# Patient Record
Sex: Male | Born: 1955 | Race: White | Hispanic: No | Marital: Single | Smoking: Never smoker
Health system: Southern US, Community
[De-identification: ages and names within clinical notes are randomized; demographics above are authoritative.]

## PROBLEM LIST (undated history)

## (undated) DIAGNOSIS — I2510 Atherosclerotic heart disease of native coronary artery without angina pectoris: Secondary | ICD-10-CM

## (undated) DIAGNOSIS — J449 Chronic obstructive pulmonary disease, unspecified: Secondary | ICD-10-CM

## (undated) DIAGNOSIS — I10 Essential (primary) hypertension: Secondary | ICD-10-CM

## (undated) DIAGNOSIS — M1990 Unspecified osteoarthritis, unspecified site: Secondary | ICD-10-CM

## (undated) DIAGNOSIS — J984 Other disorders of lung: Secondary | ICD-10-CM

## (undated) DIAGNOSIS — T4145XA Adverse effect of unspecified anesthetic, initial encounter: Secondary | ICD-10-CM

## (undated) DIAGNOSIS — R252 Cramp and spasm: Secondary | ICD-10-CM

## (undated) DIAGNOSIS — T8859XA Other complications of anesthesia, initial encounter: Secondary | ICD-10-CM

## (undated) DIAGNOSIS — C801 Malignant (primary) neoplasm, unspecified: Secondary | ICD-10-CM

## (undated) DIAGNOSIS — J45909 Unspecified asthma, uncomplicated: Secondary | ICD-10-CM

## (undated) DIAGNOSIS — F4310 Post-traumatic stress disorder, unspecified: Secondary | ICD-10-CM

## (undated) DIAGNOSIS — K219 Gastro-esophageal reflux disease without esophagitis: Secondary | ICD-10-CM

## (undated) DIAGNOSIS — G4730 Sleep apnea, unspecified: Secondary | ICD-10-CM

## (undated) HISTORY — DX: Other disorders of lung: J98.4

## (undated) HISTORY — PX: KNEE ARTHROSCOPY W/ MENISCAL REPAIR: SHX1877

## (undated) HISTORY — PX: KNEE SURGERY: SHX244

---

## 2008-03-26 ENCOUNTER — Ambulatory Visit (HOSPITAL_COMMUNITY): Admission: RE | Admit: 2008-03-26 | Discharge: 2008-03-26 | Payer: Self-pay | Admitting: Orthopedic Surgery

## 2008-04-29 ENCOUNTER — Ambulatory Visit (HOSPITAL_BASED_OUTPATIENT_CLINIC_OR_DEPARTMENT_OTHER): Admission: RE | Admit: 2008-04-29 | Discharge: 2008-04-29 | Payer: Self-pay | Admitting: Orthopedic Surgery

## 2010-06-21 LAB — POCT HEMOGLOBIN-HEMACUE: Hemoglobin: 15.1 g/dL (ref 13.0–17.0)

## 2010-07-19 NOTE — Op Note (Signed)
Elijah Knapp, Elijah Knapp             ACCOUNT NO.:  [id number]   MEDICAL RECORD NO.:  [id number]          PATIENT TYPE:  AMB   LOCATION:  [facility]                         FACILITY:  [facility]   PHYSICIAN:  Ollen Gross, M.D.    DATE OF BIRTH:  July 01, 1955   DATE OF PROCEDURE:  DATE OF DISCHARGE:                               OPERATIVE REPORT   PREOPERATIVE DIAGNOSIS:  Right knee medial meniscal tear.   POSTOPERATIVE DIAGNOSIS:  Right knee medial meniscal tear.   PROCEDURE:  Right knee arthroscopy with medial meniscal debridement.   SURGEON:  Ollen Gross, M.D.   ASSISTANT:  No assistant.   ANESTHESIA:  General.   ESTIMATED BLOOD LOSS:  Minimal.   DRAINS:  None.   COMPLICATIONS:  None.   CONDITION:  Stable to recovery.   CLINICAL NOTE:  Elijah Knapp is a 55 year old male with a several-month  history of significant right knee pain with mechanical symptoms.  Exam  and history suggested a medial meniscal tear, confirmed by MRI.  He  presents now for arthroscopy and debridement.   PROCEDURE IN DETAIL:  After the successful administration of general  anesthetic, a tourniquet was placed high on the right thigh.  The lower  extremity was prepped and draped in the usual sterile fashion.  Standard  superomedial and inferolateral incisions were made, an inflow cannula  passed superomedial, the camera passed inferolateral.  Arthroscopic  visualization proceeds.  The undersurface of the patella and the  trochlea looked normal.  The medial and lateral gutters were visualized.  There were no loose bodies.  Flexion and valgus force was applied to the  knee and the medial compartment was entered.  He had a significant tear  in the body and posterior horn of the medial meniscus.  A spinal needle  was used to localize the inferomedial portal, a small incision made, and  the dilator placed.  The meniscus was then debrided back to a stable  base with baskets and a 4.2-mm shaver and then sealed off  with the  ArthroCare device.  This was then probed and found to be stable.  The  rest of the medial compartment looked fine.  Had minimal chondromalacia.  The intercondylar notch was visualized and that looked normal.  The  lateral compartment was entered and, again, minimal chondromalacia  there.  I then inspected the rest of the joint.  There were no other  tears, loose bodies or chondral defects noted.  The arthroscopic  equipment was then removed  from the inferior portals, which were closed with interrupted 4-0 nylon.  Then 20 mL of 0.25% Marcaine with epi was injected through the inflow  cannula, then that was removed and that portal closed with nylon.  A  bulky sterile dressing was then applied and he was subsequently awakened  and transported to recovery in stable condition.      Ollen Gross, M.D.  Electronically Signed     FA/MEDQ  D:  04/29/2008  T:  04/30/2008  Job:  [id number]

## 2017-04-24 ENCOUNTER — Emergency Department (HOSPITAL_COMMUNITY)
Admission: EM | Admit: 2017-04-24 | Discharge: 2017-04-24 | Disposition: A | Discharge status: Home / Self Care | Payer: 59 | Attending: Emergency Medicine | Admitting: Emergency Medicine

## 2017-04-24 ENCOUNTER — Emergency Department (HOSPITAL_COMMUNITY): Payer: 59

## 2017-04-24 ENCOUNTER — Other Ambulatory Visit: Payer: Self-pay

## 2017-04-24 ENCOUNTER — Encounter (HOSPITAL_COMMUNITY): Payer: Self-pay | Admitting: Emergency Medicine

## 2017-04-24 DIAGNOSIS — I10 Essential (primary) hypertension: Secondary | ICD-10-CM | POA: Diagnosis not present

## 2017-04-24 DIAGNOSIS — R079 Chest pain, unspecified: Secondary | ICD-10-CM | POA: Diagnosis present

## 2017-04-24 HISTORY — DX: Essential (primary) hypertension: I10

## 2017-04-24 LAB — CBC
HEMATOCRIT: 42.3 % (ref 39.0–52.0)
HEMOGLOBIN: 14.7 g/dL (ref 13.0–17.0)
MCH: 32 pg (ref 26.0–34.0)
MCHC: 34.8 g/dL (ref 30.0–36.0)
MCV: 92 fL (ref 78.0–100.0)
Platelets: 277 10*3/uL (ref 150–400)
RBC: 4.6 MIL/uL (ref 4.22–5.81)
RDW: 12.5 % (ref 11.5–15.5)
WBC: 5 10*3/uL (ref 4.0–10.5)

## 2017-04-24 LAB — BASIC METABOLIC PANEL
ANION GAP: 14 (ref 5–15)
BUN: 10 mg/dL (ref 6–20)
CHLORIDE: 93 mmol/L — AB (ref 101–111)
CO2: 27 mmol/L (ref 22–32)
Calcium: 9.7 mg/dL (ref 8.9–10.3)
Creatinine, Ser: 0.93 mg/dL (ref 0.61–1.24)
GFR calc Af Amer: >60 mL/min (ref >60)
Glucose, Bld: 99 mg/dL (ref 65–99)
POTASSIUM: 3.7 mmol/L (ref 3.5–5.1)
SODIUM: 134 mmol/L — AB (ref 135–145)

## 2017-04-24 LAB — I-STAT TROPONIN, ED: Troponin i, poc: 0.01 ng/mL (ref 0.00–0.08)

## 2017-04-24 NOTE — ED Triage Notes (Signed)
Pt arrives to ED for CP that has been intermittent for over 1 year. Pt states this pain getting worse with exertion. Pt also becomes sob and has pressure in his chest with walking. Pt states he has been experiencing this for over one year and has not seen a doctor regarding this issue.

## 2017-04-24 NOTE — Discharge Instructions (Signed)
You were evaluated in the emergency department for chest pain that is going on and off for over a year.  You experience this pain again and the [facility] told [facility] to come to [facility] for an evaluation.  Your chest x-ray EKG and blood work do not show any acute injury.  He will need to follow-[facility] with the [facility] and if they are unable to see you also give you the number for cardiology group.  You will likely need some sort of testing for this chest pain.  You should take an aspirin a day.  Please return to the emergency department if you have a recurrence of your chest pain that does not resolve quickly.

## 2017-04-24 NOTE — ED Notes (Signed)
Pt stable, ambulatory, and verbalizes understanding of d/c instructions.  

## 2017-04-26 NOTE — ED Provider Notes (Addendum)
[place] [place] EMERGENCY DEPARTMENT Provider Note   CSN: [id number] Arrival date & time: 04/24/17  1111     History   Chief Complaint Chief Complaint  Patient presents with  . Chest Pain    HPI Elijah Knapp is a 62 y.o. male.He has had intermittent CP for 1 year plus. Happened today again. With exertion. Resolved. [facility] and was directed to come to ED for eval.   The history is provided by the patient.  Chest Pain   This is a chronic problem. The current episode started 3 to 5 hours ago. The problem occurs every several days. The problem has been resolved. The pain is associated with exertion. The pain is present in the substernal region. The pain is at a severity of 5/10. The quality of the pain is described as pressure-like. The pain does not radiate. Pertinent negatives include no abdominal pain, no back pain, no cough, no diaphoresis, no dizziness, no fever, no hemoptysis, no nausea, no palpitations, no shortness of breath and no vomiting.  Pertinent negatives for past medical history include no seizures.    Past Medical History:  Diagnosis Date  . Hypertension     There are no active problems to display for this patient.   History reviewed. No pertinent surgical history.     Home Medications    Prior to Admission medications   Not on File    Family History No family history on file.  Social History Social History   Tobacco Use  . Smoking status: Not on file  Substance Use Topics  . Alcohol use: Not on file  . Drug use: Not on file     Allergies   Patient has no known allergies.   Review of Systems Review of Systems  Constitutional: Negative for chills, diaphoresis and fever.  HENT: Negative for ear pain and sore throat.   Eyes: Negative for pain and visual disturbance.  Respiratory: Negative for cough, hemoptysis and shortness of breath.   Cardiovascular: Positive for chest pain. Negative for  palpitations.  Gastrointestinal: Negative for abdominal pain, nausea and vomiting.  Genitourinary: Negative for dysuria and hematuria.  Musculoskeletal: Negative for arthralgias and back pain.  Skin: Negative for color change and rash.  Neurological: Negative for dizziness, seizures and syncope.  All other systems reviewed and are negative.    Physical Exam Updated Vital Signs BP (!) 141/70   Pulse 68   Temp 98 F (36.7 C) (Oral)   Resp 18   SpO2 100%   Physical Exam  Constitutional: He appears well-developed and well-nourished.  HENT:  Head: Normocephalic and atraumatic.  Eyes: Conjunctivae are normal.  Neck: Neck supple.  Cardiovascular: Normal rate, regular rhythm and normal pulses.  No murmur heard. Pulmonary/Chest: Effort normal and breath sounds normal. No respiratory distress.  Abdominal: Soft. There is no tenderness.  Musculoskeletal: Normal range of motion. He exhibits no edema.       Right lower leg: He exhibits no tenderness and no edema.       Left lower leg: He exhibits no tenderness and no edema.  Neurological: He is alert.  Skin: Skin is warm and dry. Capillary refill takes less than 2 seconds.  Psychiatric: He has a normal mood and affect.  Nursing note and vitals reviewed.    ED Treatments / Results  Labs (all labs ordered are listed, but only abnormal results are displayed) Labs Reviewed  BASIC METABOLIC PANEL - Abnormal; Notable for the  following components:      Result Value   Sodium 134 (*)    Chloride 93 (*)    All other components within normal limits  CBC  I-STAT TROPONIN, ED    EKG  EKG Interpretation  Date/Time:  Tuesday April 24 2017 16:45:58 EST Ventricular Rate:  71 PR Interval:  162 QRS Duration: 106 QT Interval:  390 QTC Calculation: 423 R Axis:   56 Text Interpretation:  Normal sinus rhythm Nonspecific T wave abnormality Abnormal ECG Confirmed by Aletta Edouard [id number]) on 04/24/2017 4:58:36 PM       Radiology Dg  Chest 2 View  Result Date: 04/24/2017 CLINICAL DATA:  Dizziness, shortness of breath, fatigue EXAM: CHEST  2 VIEW COMPARISON:  None. FINDINGS: No active infiltrate or effusion is seen. Mediastinal and hilar contours are unremarkable. The heart is within normal limits in size. No bony abnormality is seen. IMPRESSION: No active cardiopulmonary disease. Electronically Signed   By: Ivar Drape M.D.   On: 04/24/2017 11:49    Procedures Procedures (including critical care time)  Medications Ordered in ED Medications - No data to display   Initial Impression / Assessment and Plan / ED Course  I have reviewed the triage vital signs and the nursing notes.  Pertinent labs & imaging results that were available during my care of the patient were reviewed by me and considered in my medical decision making (see chart for details).  Clinical Course as of May 03 803  Tue Apr 24, 2017  1706 His chest pain has not recurred here.I gave him the options of admission for stress test or outpatient followup with his doctor. He understands to take an aspirin a day and return ifBut we will also give him the number for cardiology here if he is having difficulty in arranging that.Patient is having no active chest pain here and his workup is been unremarkable.  He would like to follow-up with the [facility]  [MB]  Wed May 02, 2017  6045 Repeat EKG [MB]  0750  EKG Interpretation  Date/Time:  Tuesday April 24 2017 16:45:58 EST Ventricular Rate:  71 PR Interval:  162 QRS Duration: 106 QT Interval:  390 QTC Calculation: 423 R Axis:   56 Text Interpretation:  Normal sinus rhythm Nonspecific T wave abnormality  Abnormal ECG Confirmed by Aletta Edouard [phone]) on 04/24/2017 4:58:36 PM       [MB]  0803 EKG: normal sinus rhythm, nonspecific ST and T waves changes, rate 66 2/19 11:16am   [MB]    Clinical Course User Index [MB] Hayden Rasmussen, MD      Final Clinical Impressions(s) / ED Diagnoses   Final  diagnoses:  Chest pain in adult    ED Discharge Orders    None       Hayden Rasmussen, MD 04/26/17 1130    Hayden Rasmussen, MD 05/02/17 1914    Hayden Rasmussen, MD 05/02/17 [phone]

## 2017-05-09 ENCOUNTER — Ambulatory Visit: Payer: Managed Care, Other (non HMO) | Admitting: Cardiovascular Disease

## 2017-05-23 ENCOUNTER — Encounter: Payer: Self-pay | Admitting: Cardiovascular Disease

## 2017-05-23 ENCOUNTER — Ambulatory Visit (INDEPENDENT_AMBULATORY_CARE_PROVIDER_SITE_OTHER): Payer: 59 | Admitting: Cardiovascular Disease

## 2017-05-23 DIAGNOSIS — I208 Other forms of angina pectoris: Secondary | ICD-10-CM | POA: Diagnosis not present

## 2017-05-23 DIAGNOSIS — R079 Chest pain, unspecified: Secondary | ICD-10-CM | POA: Insufficient documentation

## 2017-05-23 NOTE — Addendum Note (Signed)
Addended by: Therisa Doyne on: 05/23/2017 04:50 PM   Modules accepted: Orders

## 2017-05-23 NOTE — H&P (View-Only) (Signed)
05/23/2017 Elijah Knapp   [birth year]  [id number]  Primary Physician System, Pcp Not In Primary Cardiologist: Elijah Harp MD Renae Gloss  HPI:  Elijah Knapp is a 62 y.o. thin-appearing divorced Caucasian male father of 2 children who currently works as a Scientist, product/process development. He is cared for by the [facility] in [place] His only cardiac risk factors family history father died of myocardial infarction. He's never had a heart attack or stroke.He has had chest pain for last year worse recently now occurring on a daily basis. The pain is worse with exertion and occurs at night as well.    No outpatient medications have been marked as taking for the 05/23/17 encounter (Office Visit) with Elijah Harp, MD.     No Known Allergies  Social History   Socioeconomic History  . Marital status: Married    Spouse name: Not on file  . Number of children: Not on file  . Years of education: Not on file  . Highest education level: Not on file  Social Needs  . Financial resource strain: Not on file  . Food insecurity - worry: Not on file  . Food insecurity - inability: Not on file  . Transportation needs - medical: Not on file  . Transportation needs - non-medical: Not on file  Occupational History  . Not on file  Tobacco Use  . Smoking status: Never Smoker  . Smokeless tobacco: Never Used  Substance and Sexual Activity  . Alcohol use: Not on file  . Drug use: Not on file  . Sexual activity: Not on file  Other Topics Concern  . Not on file  Social History Narrative  . Not on file     Review of Systems: General: negative for chills, fever, night sweats or weight changes.  Cardiovascular: negative for chest pain, dyspnea on exertion, edema, orthopnea, palpitations, paroxysmal nocturnal dyspnea or shortness of breath Dermatological: negative for rash Respiratory: negative for cough or wheezing Urologic: negative for hematuria Abdominal: negative for  nausea, vomiting, diarrhea, bright red blood per rectum, melena, or hematemesis Neurologic: negative for visual changes, syncope, or dizziness All other systems reviewed and are otherwise negative except as noted above.    Blood pressure 128/78, pulse 79, height 5\' 7"  (1.702 m), weight 165 lb 12.8 oz (75.2 kg).  General appearance: alert and no distress Neck: no adenopathy, no carotid bruit, no JVD, supple, symmetrical, trachea midline and thyroid not enlarged, symmetric, no tenderness/mass/nodules Lungs: clear to auscultation bilaterally Heart: regular rate and rhythm, S1, S2 normal, no murmur, click, rub or gallop Extremities: extremities normal, atraumatic, no cyanosis or edema Pulses: 2+ and symmetric Skin: Skin color, texture, turgor normal. No rashes or lesions Neurologic: Alert and oriented X 3, normal strength and tone. Normal symmetric reflexes. Normal coordination and gait  EKG not performed today  ASSESSMENT AND PLAN:   Chest pain [name] was recently seen at [facility] emergency room on 04/24/17 with chest pain. He ruled out for myocardial infarction. EKG showed no acute changes. His risk factors include family history with a father to have a myocardial infarction although he has no other risk factors. He had pain for a year which is gotten worse. Several episodes a day, worse with exertion as well as pain that awakens him from sleep. Based on the symptoms I feel compelled to proceed directly to cardiac catheterization which we will do in this coming Monday. The right radial approach. The patient understands that  risks included but are not limited to stroke (1 in 1000), death (1 in 70), kidney failure [usually temporary] (1 in 500), bleeding (1 in 200), allergic reaction [possibly serious] (1 in 200). The patient understands and agrees to proceed      Elijah Harp MD [facility], [facility] 05/23/2017 4:43 PM

## 2017-05-23 NOTE — Assessment & Plan Note (Signed)
Elijah Knapp was recently seen at [facility] emergency room on 04/24/17 with chest pain. He ruled out for myocardial infarction. EKG showed no acute changes. His risk factors include family history with a father to have a myocardial infarction although he has no other risk factors. He had pain for a year which is gotten worse. Several episodes a day, worse with exertion as well as pain that awakens him from sleep. Based on the symptoms I feel compelled to proceed directly to cardiac catheterization which we will do in this coming Monday. The right radial approach. The patient understands that risks included but are not limited to stroke (1 in 1000), death (1 in 79), kidney failure [usually temporary] (1 in 500), bleeding (1 in 200), allergic reaction [possibly serious] (1 in 200). The patient understands and agrees to proceed

## 2017-05-23 NOTE — Progress Notes (Signed)
05/23/2017 Elijah Knapp   [birth year]  [id number]  Primary Physician System, Pcp Not In Primary Cardiologist: Elijah Harp MD Elijah Knapp  HPI:  Elijah Knapp is a 62 y.o. thin-appearing divorced Caucasian male father of 2 children who currently works as a Scientist, product/process development. He is cared for by the [facility] in [place] His only cardiac risk factors family history father died of myocardial infarction. He's never had a heart attack or stroke.He has had chest pain for last year worse recently now occurring on a daily basis. The pain is worse with exertion and occurs at night as well.    No outpatient medications have been marked as taking for the 05/23/17 encounter (Office Visit) with Elijah Harp, MD.     No Known Allergies  Social History   Socioeconomic History  . Marital status: Married    Spouse name: Not on file  . Number of children: Not on file  . Years of education: Not on file  . Highest education level: Not on file  Social Needs  . Financial resource strain: Not on file  . Food insecurity - worry: Not on file  . Food insecurity - inability: Not on file  . Transportation needs - medical: Not on file  . Transportation needs - non-medical: Not on file  Occupational History  . Not on file  Tobacco Use  . Smoking status: Never Smoker  . Smokeless tobacco: Never Used  Substance and Sexual Activity  . Alcohol use: Not on file  . Drug use: Not on file  . Sexual activity: Not on file  Other Topics Concern  . Not on file  Social History Narrative  . Not on file     Review of Systems: General: negative for chills, fever, night sweats or weight changes.  Cardiovascular: negative for chest pain, dyspnea on exertion, edema, orthopnea, palpitations, paroxysmal nocturnal dyspnea or shortness of breath Dermatological: negative for rash Respiratory: negative for cough or wheezing Urologic: negative for hematuria Abdominal: negative for  nausea, vomiting, diarrhea, bright red blood per rectum, melena, or hematemesis Neurologic: negative for visual changes, syncope, or dizziness All other systems reviewed and are otherwise negative except as noted above.    Blood pressure 128/78, pulse 79, height 5\' 7"  (1.702 m), weight 165 lb 12.8 oz (75.2 kg).  General appearance: alert and no distress Neck: no adenopathy, no carotid bruit, no JVD, supple, symmetrical, trachea midline and thyroid not enlarged, symmetric, no tenderness/mass/nodules Lungs: clear to auscultation bilaterally Heart: regular rate and rhythm, S1, S2 normal, no murmur, click, rub or gallop Extremities: extremities normal, atraumatic, no cyanosis or edema Pulses: 2+ and symmetric Skin: Skin color, texture, turgor normal. No rashes or lesions Neurologic: Alert and oriented X 3, normal strength and tone. Normal symmetric reflexes. Normal coordination and gait  EKG not performed today  ASSESSMENT AND PLAN:   Chest pain [name] was recently seen at [facility] emergency room on 04/24/17 with chest pain. He ruled out for myocardial infarction. EKG showed no acute changes. His risk factors include family history with a father to have a myocardial infarction although he has no other risk factors. He had pain for a year which is gotten worse. Several episodes a day, worse with exertion as well as pain that awakens him from sleep. Based on the symptoms I feel compelled to proceed directly to cardiac catheterization which we will do in this coming Monday. The right radial approach. The patient understands that  risks included but are not limited to stroke (1 in 1000), death (1 in 32), kidney failure [usually temporary] (1 in 500), bleeding (1 in 200), allergic reaction [possibly serious] (1 in 200). The patient understands and agrees to proceed      Elijah Harp MD [facility], [facility] 05/23/2017 4:43 PM

## 2017-05-23 NOTE — Patient Instructions (Signed)
   [place] [address] [place] [address] Dept: [phone] Loc: [phone]  Elijah Knapp  05/23/2017  You are scheduled for a Cardiac Catheterization on Monday, March 25 with Dr. Quay Burow.  1. Please arrive at the [facility] (Main Entrance A) at [facility]: [address] [place], [place] [address] at 11:30 AM (two hours before your procedure to ensure your preparation). Free valet parking service is available.   Special note: Every effort is made to have your procedure done on time. Please understand that emergencies sometimes delay scheduled procedures.  2. Diet: Do not eat or drink anything after midnight prior to your procedure except sips of water to take medications.  3. Labs: Please return to our office tomorrow to have blood work drawn.  4. Medication instructions in preparation for your procedure:  None  5. Plan for one night stay--bring personal belongings. 6. Bring a current list of your medications and current insurance cards. 7. You MUST have a responsible person to drive you home. 8. Someone MUST be with you the first 24 hours after you arrive home or your discharge will be delayed. 9. Please wear clothes that are easy to get on and off and wear slip-on shoes.  Thank you for allowing [place] to care for you!   -- [facility]   Post-procedure Follow-up:  Your physician recommends that you schedule a follow-up appointment 2 weeks after CATH with Dr. Gwenlyn Found.

## 2017-05-24 ENCOUNTER — Other Ambulatory Visit: Payer: Self-pay | Admitting: *Deleted

## 2017-05-25 ENCOUNTER — Telehealth: Payer: Self-pay | Admitting: Cardiovascular Disease

## 2017-05-25 LAB — CBC WITH DIFFERENTIAL/PLATELET
BASOS ABS: 0 10*3/uL (ref 0.0–0.2)
Basos: 1 %
EOS (ABSOLUTE): 0.1 10*3/uL (ref 0.0–0.4)
Eos: 2 %
HEMATOCRIT: 43.8 % (ref 37.5–51.0)
Hemoglobin: 15.1 g/dL (ref 13.0–17.7)
Immature Grans (Abs): 0 10*3/uL (ref 0.0–0.1)
Immature Granulocytes: 1 %
LYMPHS ABS: 1.3 10*3/uL (ref 0.7–3.1)
Lymphs: 26 %
MCH: 32.1 pg (ref 26.6–33.0)
MCHC: 34.5 g/dL (ref 31.5–35.7)
MCV: 93 fL (ref 79–97)
MONOS ABS: 0.3 10*3/uL (ref 0.1–0.9)
Monocytes: 5 %
NEUTROS ABS: 3.3 10*3/uL (ref 1.4–7.0)
Neutrophils: 65 %
Platelets: 287 10*3/uL (ref 150–379)
RBC: 4.71 x10E6/uL (ref 4.14–5.80)
RDW: 13.5 % (ref 12.3–15.4)
WBC: 5.1 10*3/uL (ref 3.4–10.8)

## 2017-05-25 LAB — BASIC METABOLIC PANEL
BUN/Creatinine Ratio: 21 (ref 10–24)
BUN: 18 mg/dL (ref 8–27)
CHLORIDE: 94 mmol/L — AB (ref 96–106)
CO2: 26 mmol/L (ref 20–29)
Calcium: 10.2 mg/dL (ref 8.6–10.2)
Creatinine, Ser: 0.85 mg/dL (ref 0.76–1.27)
GFR calc non Af Amer: 93 mL/min/{1.73_m2} (ref >59)
GFR, EST AFRICAN AMERICAN: 107 mL/min/{1.73_m2} (ref >59)
GLUCOSE: 105 mg/dL — AB (ref 65–99)
Potassium: 4.1 mmol/L (ref 3.5–5.2)
SODIUM: 141 mmol/L (ref 134–144)

## 2017-05-25 LAB — PROTIME-INR
INR: 0.9 (ref 0.8–1.2)
Prothrombin Time: 9.6 s (ref 9.1–12.0)

## 2017-05-25 LAB — APTT: aPTT: 25 s (ref 24–33)

## 2017-05-25 LAB — TSH: TSH: 2.94 u[IU]/mL (ref 0.450–4.500)

## 2017-05-25 NOTE — Telephone Encounter (Signed)
Left detailed message--ok per DPR--to inform pt of time change for cath scheduled on Monday with Dr. Gwenlyn Found. Procedure rescheduled from 1300PM to 0900 AM with arrival time at 0700AM.   Explained to pt to arrive at [facility] [place] on 05/28/17 and to call if any questions or concerns.

## 2017-05-28 ENCOUNTER — Encounter (HOSPITAL_COMMUNITY): Payer: Self-pay | Admitting: Cardiovascular Disease

## 2017-05-28 ENCOUNTER — Ambulatory Visit (HOSPITAL_COMMUNITY)
Admission: RE | Admit: 2017-05-28 | Discharge: 2017-05-28 | Disposition: A | Discharge status: Home / Self Care | Payer: 59 | Source: Ambulatory Visit | Attending: Cardiovascular Disease | Admitting: Cardiovascular Disease

## 2017-05-28 ENCOUNTER — Ambulatory Visit (HOSPITAL_COMMUNITY)
Admission: RE | Disposition: A | Discharge status: Home / Self Care | Payer: Self-pay | Source: Ambulatory Visit | Attending: Cardiovascular Disease

## 2017-05-28 ENCOUNTER — Telehealth: Payer: Self-pay | Admitting: Cardiovascular Disease

## 2017-05-28 DIAGNOSIS — I25110 Atherosclerotic heart disease of native coronary artery with unstable angina pectoris: Secondary | ICD-10-CM | POA: Diagnosis present

## 2017-05-28 DIAGNOSIS — R079 Chest pain, unspecified: Secondary | ICD-10-CM | POA: Diagnosis present

## 2017-05-28 DIAGNOSIS — Z8249 Family history of ischemic heart disease and other diseases of the circulatory system: Secondary | ICD-10-CM | POA: Diagnosis not present

## 2017-05-28 HISTORY — PX: CARDIAC CATHETERIZATION: SHX172

## 2017-05-28 HISTORY — PX: LEFT HEART CATH AND CORONARY ANGIOGRAPHY: CATH118249

## 2017-05-28 SURGERY — LEFT HEART CATH AND CORONARY ANGIOGRAPHY
Anesthesia: LOCAL

## 2017-05-28 MED ORDER — HEPARIN SODIUM (PORCINE) 1000 UNIT/ML IJ SOLN
INTRAMUSCULAR | Status: DC | PRN
  Administered 2017-05-28: 3500 [IU] via INTRAVENOUS

## 2017-05-28 MED ORDER — HEPARIN (PORCINE) IN NACL 2-0.9 UNIT/ML-% IJ SOLN
INTRAMUSCULAR | Status: AC | PRN
  Administered 2017-05-28: 500 mL

## 2017-05-28 MED ORDER — NITROGLYCERIN 1 MG/10 ML FOR IR/CATH LAB
INTRA_ARTERIAL | Status: AC
  Filled 2017-05-28: qty 10

## 2017-05-28 MED ORDER — MIDAZOLAM HCL 2 MG/2ML IJ SOLN
INTRAMUSCULAR | Status: AC
  Filled 2017-05-28: qty 2

## 2017-05-28 MED ORDER — LIDOCAINE HCL 1 % IJ SOLN
INTRAMUSCULAR | Status: AC
  Filled 2017-05-28: qty 20

## 2017-05-28 MED ORDER — ASPIRIN 81 MG PO CHEW: 81.0000 mg | CHEWABLE_TABLET | Freq: Every day | ORAL | Status: DC

## 2017-05-28 MED ORDER — SODIUM CHLORIDE 0.9% FLUSH: 3.0000 mL | INTRAVENOUS | Status: DC | PRN

## 2017-05-28 MED ORDER — BIVALIRUDIN TRIFLUOROACETATE 250 MG IV SOLR
INTRAVENOUS | Status: AC
  Filled 2017-05-28: qty 250

## 2017-05-28 MED ORDER — MORPHINE SULFATE (PF) 10 MG/ML IV SOLN: 2.0000 mg | INTRAVENOUS | Status: DC | PRN

## 2017-05-28 MED ORDER — FENTANYL CITRATE (PF) 100 MCG/2ML IJ SOLN
INTRAMUSCULAR | Status: AC
  Filled 2017-05-28: qty 2

## 2017-05-28 MED ORDER — IOPAMIDOL (ISOVUE-370) INJECTION 76%
INTRAVENOUS | Status: AC
  Filled 2017-05-28: qty 100

## 2017-05-28 MED ORDER — VERAPAMIL HCL 2.5 MG/ML IV SOLN
INTRAVENOUS | Status: AC
  Filled 2017-05-28: qty 2

## 2017-05-28 MED ORDER — ACETAMINOPHEN 325 MG PO TABS: 650.0000 mg | ORAL_TABLET | ORAL | Status: DC | PRN

## 2017-05-28 MED ORDER — SODIUM CHLORIDE 0.9 % IV SOLN: 250.0000 mL | INTRAVENOUS | Status: DC | PRN

## 2017-05-28 MED ORDER — ASPIRIN EC 81 MG PO TBEC: 81.0000 mg | DELAYED_RELEASE_TABLET | Freq: Every day | ORAL | Status: DC

## 2017-05-28 MED ORDER — SODIUM CHLORIDE 0.9 % WEIGHT BASED INFUSION: 1.0000 mL/kg/h | INTRAVENOUS | Status: DC

## 2017-05-28 MED ORDER — FENTANYL CITRATE (PF) 100 MCG/2ML IJ SOLN
INTRAMUSCULAR | Status: DC | PRN
  Administered 2017-05-28: 25 ug via INTRAVENOUS

## 2017-05-28 MED ORDER — HYDROCHLOROTHIAZIDE 25 MG PO TABS: 25.0000 mg | ORAL_TABLET | Freq: Every day | ORAL | Status: DC

## 2017-05-28 MED ORDER — SODIUM CHLORIDE 0.9% FLUSH: 3.0000 mL | Freq: Two times a day (BID) | INTRAVENOUS | Status: DC

## 2017-05-28 MED ORDER — VERAPAMIL HCL 2.5 MG/ML IV SOLN
INTRA_ARTERIAL | Status: DC | PRN
  Administered 2017-05-28: 5 mL via INTRA_ARTERIAL

## 2017-05-28 MED ORDER — LIDOCAINE HCL (PF) 1 % IJ SOLN
INTRAMUSCULAR | Status: DC | PRN
  Administered 2017-05-28: 2 mL

## 2017-05-28 MED ORDER — ASPIRIN 81 MG PO CHEW
81.0000 mg | CHEWABLE_TABLET | ORAL | Status: AC
  Administered 2017-05-28: 81 mg via ORAL

## 2017-05-28 MED ORDER — SODIUM CHLORIDE 0.9 % WEIGHT BASED INFUSION
3.0000 mL/kg/h | INTRAVENOUS | Status: AC
  Administered 2017-05-28: 3 mL/kg/h via INTRAVENOUS

## 2017-05-28 MED ORDER — IOPAMIDOL (ISOVUE-370) INJECTION 76%
INTRAVENOUS | Status: DC | PRN
  Administered 2017-05-28: 80 mL via INTRA_ARTERIAL

## 2017-05-28 MED ORDER — MIDAZOLAM HCL 2 MG/2ML IJ SOLN
INTRAMUSCULAR | Status: DC | PRN
  Administered 2017-05-28: 1 mg via INTRAVENOUS

## 2017-05-28 MED ORDER — HEPARIN (PORCINE) IN NACL 2-0.9 UNIT/ML-% IJ SOLN
INTRAMUSCULAR | Status: AC
  Filled 2017-05-28: qty 1000

## 2017-05-28 MED ORDER — HEPARIN SODIUM (PORCINE) 1000 UNIT/ML IJ SOLN
INTRAMUSCULAR | Status: AC
  Filled 2017-05-28: qty 1

## 2017-05-28 MED ORDER — SODIUM CHLORIDE 0.9 % IV SOLN: INTRAVENOUS | Status: AC

## 2017-05-28 MED ORDER — ASPIRIN 81 MG PO CHEW
CHEWABLE_TABLET | ORAL | Status: AC
  Filled 2017-05-28: qty 1

## 2017-05-28 MED ORDER — ONDANSETRON HCL 4 MG/2ML IJ SOLN: 4.0000 mg | Freq: Four times a day (QID) | INTRAMUSCULAR | Status: DC | PRN

## 2017-05-28 MED ORDER — PANTOPRAZOLE SODIUM 40 MG PO TBEC: 40.0000 mg | DELAYED_RELEASE_TABLET | Freq: Every day | ORAL | Status: DC

## 2017-05-28 SURGICAL SUPPLY — 12 items
BAND ZEPHYR COMPRESS 30 LONG (HEMOSTASIS) ×2 IMPLANT
CATH INFINITI 5FR ANG PIGTAIL (CATHETERS) ×2 IMPLANT
CATH OPTITORQUE TIG 4.0 5F (CATHETERS) ×2 IMPLANT
GLIDESHEATH SLEND A-KIT 6F 22G (SHEATH) ×2 IMPLANT
GUIDEWIRE INQWIRE 1.5J.035X260 (WIRE) ×1 IMPLANT
INQWIRE 1.5J .035X260CM (WIRE) ×2
KIT HEART LEFT (KITS) ×2 IMPLANT
PACK CARDIAC CATHETERIZATION (CUSTOM PROCEDURE TRAY) ×2 IMPLANT
SYR MEDRAD MARK V 150ML (SYRINGE) ×2 IMPLANT
TRANSDUCER W/STOPCOCK (MISCELLANEOUS) ×2 IMPLANT
TUBING CIL FLEX 10 FLL-RA (TUBING) ×2 IMPLANT
WIRE HI TORQ VERSACORE-J 145CM (WIRE) ×2 IMPLANT

## 2017-05-28 NOTE — Discharge Instructions (Signed)

## 2017-05-28 NOTE — Telephone Encounter (Signed)
New message    Darlene from [facility] [place]  calling to ask if [facility] obtained pre auth from the [place] for procedure today. Please call [phone]

## 2017-05-28 NOTE — Interval H&P Note (Signed)
Cath Lab Visit (complete for each Cath Lab visit)  Clinical Evaluation Leading to the Procedure:   ACS: No.  Non-ACS:    Anginal Classification: CCS II  Anti-ischemic medical therapy: No Therapy  Non-Invasive Test Results: No non-invasive testing performed  Prior CABG: No previous CABG      History and Physical Interval Note:  05/28/2017 9:08 AM  Elijah Knapp  has presented today for surgery, with the diagnosis of cp  The various methods of treatment have been discussed with the patient and family. After consideration of risks, benefits and other options for treatment, the patient has consented to  Procedure(s): LEFT HEART CATH AND CORONARY ANGIOGRAPHY (N/A) as a surgical intervention .  The patient's history has been reviewed, patient examined, no change in status, stable for surgery.  I have reviewed the patient's chart and labs.  Questions were answered to the patient's satisfaction.     Quay Burow

## 2017-05-28 NOTE — Research (Signed)
CADFEM Informed Consent   Subject Name: Elijah Knapp  Subject met inclusion and exclusion criteria.  The informed consent form, study requirements and expectations were reviewed with the subject and questions and concerns were addressed prior to the signing of the consent form.  The subject verbalized understanding of the trail requirements.  The subject agreed to participate in the CADFEM trial and signed the informed consent.  The informed consent was obtained prior to performance of any protocol-specific procedures for the subject.  A copy of the signed informed consent was given to the subject and a copy was placed in the subject's medical record.  Christena Flake 05/28/2017, 08:01 AM

## 2017-05-28 NOTE — Telephone Encounter (Signed)
Returned call to [employer]  She states that pt is scheduled for Cath this afternoon at the [place] and was wondering if Prior Auth was obtained, because pt wanted to have billed to the [place].  Pt had cath @9am  today w/Dr Gwenlyn Found @ [facility]. Billing is gone for today will ash in the AM

## 2017-05-29 MED FILL — Lidocaine HCl Local Inj 1%: INTRAMUSCULAR | Qty: 20 | Status: AC

## 2017-05-29 NOTE — Telephone Encounter (Signed)
D/w billing, this is the pt's responsibility. [facility] do not bill [facility]-this needs to be arranged usually, before scheduled.  [place], notified

## 2017-05-30 ENCOUNTER — Other Ambulatory Visit: Payer: Self-pay

## 2017-05-30 ENCOUNTER — Other Ambulatory Visit: Payer: Self-pay | Admitting: *Deleted

## 2017-05-30 ENCOUNTER — Encounter: Payer: Self-pay | Admitting: *Deleted

## 2017-05-30 ENCOUNTER — Institutional Professional Consult (permissible substitution) (INDEPENDENT_AMBULATORY_CARE_PROVIDER_SITE_OTHER): Payer: 59 | Admitting: Cardiothoracic Surgery

## 2017-05-30 ENCOUNTER — Encounter: Payer: Self-pay | Admitting: Cardiothoracic Surgery

## 2017-05-30 VITALS — BP 165/90 | HR 84 | Resp 16 | Ht 67.0 in | Wt 165.0 lb

## 2017-05-30 DIAGNOSIS — I2510 Atherosclerotic heart disease of native coronary artery without angina pectoris: Secondary | ICD-10-CM

## 2017-05-30 DIAGNOSIS — J984 Other disorders of lung: Secondary | ICD-10-CM

## 2017-05-30 NOTE — Progress Notes (Signed)
PCP is [facility] Referring Provider is Lorretta Harp, MD  Chief Complaint  Patient presents with  . Coronary Artery Disease    CATH 05/28/17  Patient examined, coronary angiogram images personally reviewed and counseled with patient  HPI: 62 year old nondiabetic Caucasian male with hypertension, hyperlipidemia, and positive family history of CAD father died of MI[]  presents for evaluation of his recently diagnosed multivessel CAD with recommendation for CABG by his cardiologist, Dr. Quay Burow.  Patient has had several month history of exertional chest pain and tightness relieved by rest and more recently nocturnal chest pain relieved by nitroglycerin.  Patient's father died of an MI at age 46.  Patient states he had a history of hyperlipidemia in the past.  He currently has hypertension and is currently taking losartan and metoprolol.  Cardiac catheterization 2 days ago shows high-grade 90% stenosis of the LAD, diagonal, and circumflex marginal.  RCA is dominant with minimal disease.  LVEDP is normal at 13 mmHg.  LVEF is normal at 55-60%.  Procedure performed via right radial artery.  No significant bleeding complications.  Patient has had previous bilateral knee arthroscopy in the past at outside hospitals.  He states he had one procedure with respiratory difficulty and possible difficult intubation.  Patient has history of sleep apnea and uses a nose pillow mask. Patient states he has had chronic problems with his lungs including shortness of breath with exertion and some asthma.  He states at some point he had PFTs was told he had a lungs of an 62 year old.  He denies any significant history of smoking or environmental exposure although he does work as a Scientist, product/process development and has had one episode of expresses disclosure.  He served in the [employer] in the [place]. Past Medical History:  Diagnosis Date  . Hypertension     Past Surgical History:  Procedure Laterality Date  .  LEFT HEART CATH AND CORONARY ANGIOGRAPHY N/A 05/28/2017   Procedure: LEFT HEART CATH AND CORONARY ANGIOGRAPHY;  Surgeon: Lorretta Harp, MD;  Location: [facility];  Service: Cardiovascular;  Laterality: N/A;    History reviewed. No pertinent family history.  Social History Social History   Tobacco Use  . Smoking status: Never Smoker  . Smokeless tobacco: Never Used  Substance Use Topics  . Alcohol use: Not on file  . Drug use: Not on file    Current Outpatient Medications  Medication Sig Dispense Refill  . aspirin EC 81 MG tablet Take 81 mg by mouth daily.    . Cholecalciferol (VITAMIN D3) 1000 units CAPS Take 1,000 Units by mouth daily.    . metoprolol tartrate (LOPRESSOR) 50 MG tablet Take 50 mg by mouth 2 (two) times daily.    [name] omeprazole (PRILOSEC) 20 MG capsule Take 1 capsule (20 mg total) by mouth daily.    . hydrochlorothiazide (HYDRODIURIL) 25 MG tablet Take 1 tablet (25 mg total) by mouth at bedtime.    [name] losartan (COZAAR) 100 MG tablet Take 100 mg by mouth daily.    . nitroGLYCERIN (NITROLINGUAL) 0.4 MG/SPRAY spray Place 1 spray under the tongue every 5 (five) minutes x 3 doses as needed for chest pain.     No current facility-administered medications for this visit.     No Known Allergies  Review of Systems  No fever or weight change No dental complaints or difficulty swallowing No thoracic trauma, positive history of dyspnea on exertion No significant tobacco use. He drinks 2-3 drinks beer or liquor nightly  No history of bleeding GI bleeding or jaundice No history of ankle edema DVT or phlebitis No history of stroke seizure or syncope   BP (!) 165/90 (BP Location: Left Arm, Patient Position: Sitting, Cuff Size: Large)   Pulse 84   Resp 16   Ht 5\' 7"  (1.702 m)   Wt 165 lb (74.8 kg)   SpO2 98% Comment: ON RA  BMI 25.84 kg/m  Physical Exam     Physical Exam  General: Small middle-aged Caucasian male no acute distress somewhat anxious  HEENT:  Normocephalic pupils equal , dentition adequate Neck: Supple without JVD, adenopathy, or bruit Chest: Clear to auscultation, symmetrical breath sounds, no rhonchi, no tenderness             or deformity Cardiovascular: Regular rate and rhythm, no murmur, no gallop, peripheral pulses             palpable in all extremities Abdomen:  Soft, nontender, no palpable mass or organomegaly Extremities: Warm, well-perfused, no clubbing cyanosis edema or tenderness,              no venous stasis changes of the legs Rectal/GU: Deferred Neuro: Grossly non--focal and symmetrical throughout Skin: Clean and dry without rash or ulceration    Diagnostic Tests: Coronary arteriogram showed severe 90% stenosis of the LAD diagonal and circumflex marginal Good LV function Impression: Severe multivessel coronary disease with class IV angina normal LV function Chronic lung disease, sleep apnea-PFTs and CT scan pending History of hypertension and hyperlipidemia  Plan: Patient will be scheduled for multivessel CABG on April 1 at [facility]. Prior to surgery he will undergo evaluation with CT scan of the chest with contrast as well as normal pre-CABG Dopplers PFTs and blood work.  I discussed the indications and benefits of multivessel CABG for treatment of his CAD.  I have reviewed the risks of the procedure to the patient who is a risk of stroke, bleeding, blood transfusion requirement, postoperative pulmonary problems including prolonged ventilation or oxygen requirement, postoperative pleural effusions, postoperative organ failure, and death.  He understands and agrees to proceed with surgery is scheduled for April [facility]   Len Childs, MD [facility] [phone]

## 2017-05-31 ENCOUNTER — Encounter (HOSPITAL_COMMUNITY): Payer: Self-pay

## 2017-05-31 ENCOUNTER — Encounter (HOSPITAL_COMMUNITY)
Admission: RE | Admit: 2017-05-31 | Discharge: 2017-05-31 | Disposition: A | Discharge status: Home / Self Care | Payer: 59 | Source: Ambulatory Visit | Attending: Cardiothoracic Surgery | Admitting: Cardiothoracic Surgery

## 2017-05-31 ENCOUNTER — Other Ambulatory Visit: Payer: Self-pay

## 2017-05-31 ENCOUNTER — Ambulatory Visit
Admission: RE | Admit: 2017-05-31 | Discharge: 2017-05-31 | Disposition: A | Discharge status: Home / Self Care | Payer: 59 | Source: Ambulatory Visit | Attending: Cardiothoracic Surgery | Admitting: Cardiothoracic Surgery

## 2017-05-31 ENCOUNTER — Ambulatory Visit (HOSPITAL_COMMUNITY)
Admission: RE | Admit: 2017-05-31 | Discharge: 2017-05-31 | Disposition: A | Discharge status: Home / Self Care | Payer: 59 | Source: Ambulatory Visit | Attending: Cardiothoracic Surgery | Admitting: Cardiothoracic Surgery

## 2017-05-31 DIAGNOSIS — Z79899 Other long term (current) drug therapy: Secondary | ICD-10-CM | POA: Insufficient documentation

## 2017-05-31 DIAGNOSIS — M1990 Unspecified osteoarthritis, unspecified site: Secondary | ICD-10-CM | POA: Diagnosis not present

## 2017-05-31 DIAGNOSIS — G4733 Obstructive sleep apnea (adult) (pediatric): Secondary | ICD-10-CM | POA: Insufficient documentation

## 2017-05-31 DIAGNOSIS — Z0183 Encounter for blood typing: Secondary | ICD-10-CM | POA: Insufficient documentation

## 2017-05-31 DIAGNOSIS — J449 Chronic obstructive pulmonary disease, unspecified: Secondary | ICD-10-CM | POA: Diagnosis not present

## 2017-05-31 DIAGNOSIS — I2510 Atherosclerotic heart disease of native coronary artery without angina pectoris: Secondary | ICD-10-CM | POA: Insufficient documentation

## 2017-05-31 DIAGNOSIS — I10 Essential (primary) hypertension: Secondary | ICD-10-CM | POA: Diagnosis not present

## 2017-05-31 DIAGNOSIS — F4310 Post-traumatic stress disorder, unspecified: Secondary | ICD-10-CM | POA: Insufficient documentation

## 2017-05-31 DIAGNOSIS — Z01818 Encounter for other preprocedural examination: Secondary | ICD-10-CM | POA: Diagnosis not present

## 2017-05-31 DIAGNOSIS — K219 Gastro-esophageal reflux disease without esophagitis: Secondary | ICD-10-CM | POA: Diagnosis not present

## 2017-05-31 DIAGNOSIS — Z01812 Encounter for preprocedural laboratory examination: Secondary | ICD-10-CM | POA: Diagnosis not present

## 2017-05-31 DIAGNOSIS — Z7982 Long term (current) use of aspirin: Secondary | ICD-10-CM | POA: Insufficient documentation

## 2017-05-31 DIAGNOSIS — J984 Other disorders of lung: Secondary | ICD-10-CM

## 2017-05-31 HISTORY — DX: Unspecified asthma, uncomplicated: J45.909

## 2017-05-31 HISTORY — DX: Post-traumatic stress disorder, unspecified: F43.10

## 2017-05-31 HISTORY — DX: Sleep apnea, unspecified: G47.30

## 2017-05-31 HISTORY — DX: Gastro-esophageal reflux disease without esophagitis: K21.9

## 2017-05-31 HISTORY — DX: Atherosclerotic heart disease of native coronary artery without angina pectoris: I25.10

## 2017-05-31 HISTORY — DX: Adverse effect of unspecified anesthetic, initial encounter: T41.45XA

## 2017-05-31 HISTORY — DX: Unspecified osteoarthritis, unspecified site: M19.90

## 2017-05-31 HISTORY — DX: Cramp and spasm: R25.2

## 2017-05-31 HISTORY — DX: Other complications of anesthesia, initial encounter: T88.59XA

## 2017-05-31 HISTORY — DX: Chronic obstructive pulmonary disease, unspecified: J44.9

## 2017-05-31 LAB — BLOOD GAS, ARTERIAL
Acid-Base Excess: 1.9 mmol/L (ref 0.0–2.0)
Bicarbonate: 26 mmol/L (ref 20.0–28.0)
Drawn by: 421801
FIO2: 21
O2 Saturation: 97.7 %
Patient temperature: 98.6
pCO2 arterial: 41.1 mmHg (ref 32.0–48.0)
pH, Arterial: 7.418 (ref 7.350–7.450)
pO2, Arterial: 98.8 mmHg (ref 83.0–108.0)

## 2017-05-31 LAB — TYPE AND SCREEN
ABO/RH(D): A POS
Antibody Screen: NEGATIVE

## 2017-05-31 LAB — CBC
HCT: 42.4 % (ref 39.0–52.0)
Hemoglobin: 14.9 g/dL (ref 13.0–17.0)
MCH: 32.8 pg (ref 26.0–34.0)
MCHC: 35.1 g/dL (ref 30.0–36.0)
MCV: 93.4 fL (ref 78.0–100.0)
Platelets: 236 10*3/uL (ref 150–400)
RBC: 4.54 MIL/uL (ref 4.22–5.81)
RDW: 13 % (ref 11.5–15.5)
WBC: 5.2 10*3/uL (ref 4.0–10.5)

## 2017-05-31 LAB — APTT: aPTT: 26 seconds (ref 24–36)

## 2017-05-31 LAB — URINALYSIS, ROUTINE W REFLEX MICROSCOPIC
Bilirubin Urine: NEGATIVE
Glucose, UA: NEGATIVE mg/dL
Hgb urine dipstick: NEGATIVE
Ketones, ur: NEGATIVE mg/dL
Leukocytes, UA: NEGATIVE
Nitrite: NEGATIVE
Protein, ur: NEGATIVE mg/dL
Specific Gravity, Urine: 1.025 (ref 1.005–1.030)
pH: 6 (ref 5.0–8.0)

## 2017-05-31 LAB — PROTIME-INR
INR: 0.82
Prothrombin Time: 11.2 seconds — ABNORMAL LOW (ref 11.4–15.2)

## 2017-05-31 LAB — COMPREHENSIVE METABOLIC PANEL
ALT: 22 U/L (ref 17–63)
AST: 26 U/L (ref 15–41)
Albumin: 4.2 g/dL (ref 3.5–5.0)
Alkaline Phosphatase: 63 U/L (ref 38–126)
Anion gap: 12 (ref 5–15)
BUN: 13 mg/dL (ref 6–20)
CO2: 22 mmol/L (ref 22–32)
Calcium: 9.2 mg/dL (ref 8.9–10.3)
Chloride: 101 mmol/L (ref 101–111)
Creatinine, Ser: 0.92 mg/dL (ref 0.61–1.24)
GFR calc Af Amer: >60 mL/min (ref >60)
GFR calc non Af Amer: >60 mL/min (ref >60)
Glucose, Bld: 114 mg/dL — ABNORMAL HIGH (ref 65–99)
Potassium: 4.3 mmol/L (ref 3.5–5.1)
Sodium: 135 mmol/L (ref 135–145)
Total Bilirubin: 0.8 mg/dL (ref 0.3–1.2)
Total Protein: 6.7 g/dL (ref 6.5–8.1)

## 2017-05-31 LAB — SURGICAL PCR SCREEN
MRSA, PCR: NEGATIVE
Staphylococcus aureus: NEGATIVE

## 2017-05-31 LAB — ABO/RH: ABO/RH(D): A POS

## 2017-05-31 LAB — HEMOGLOBIN A1C
Hgb A1c MFr Bld: 5.4 % (ref 4.8–5.6)
Mean Plasma Glucose: 108.28 mg/dL

## 2017-05-31 MED ORDER — IOPAMIDOL (ISOVUE-300) INJECTION 61%
75.0000 mL | Freq: Once | INTRAVENOUS | Status: AC | PRN
  Administered 2017-05-31: 75 mL via INTRAVENOUS

## 2017-05-31 NOTE — Progress Notes (Signed)
Pt has new history of CAD. Pt states he's had some chest pain a few days ago, none today. States he did not have to take NTG. Reviewed with pt how to take NTG and when to call 911 prior to surgery.

## 2017-05-31 NOTE — Pre-Procedure Instructions (Addendum)
Elijah Knapp  05/31/2017    Your procedure is scheduled on Monday, June 04, 2017 at 7:30 AM.   Report to [facility] Entrance "A" Admitting Office at 5:30 AM.   Call this number if you have problems the morning of surgery: [phone]   Questions prior to day of surgery, please call [phone] between 8 & 4 PM.   Remember:  Do not eat food or drink liquids after midnight Sunday, 06/03/17.  Take these medicines the morning of surgery with A SIP OF WATER: Metoprolol (Lopressor), Omeprazole (Prilosec), Nitroglycerin - if needed   Do not wear jewelry.  Do not wear lotions, powders, cologne or deodorant.  Men may shave face and neck.  Do not bring valuables to the hospital.  [facility] is not responsible for any belongings or valuables.  Contacts, dentures or bridgework may not be worn into surgery.  Leave your suitcase in the car.  After surgery it may be brought to your room.  For patients admitted to the hospital, discharge time will be determined by your treatment team.  [facility] - Preparing for Surgery  Before surgery, you can play an important role.  Because skin is not sterile, your skin needs to be as free of germs as possible.  You can reduce the number of germs on you skin by washing with CHG (chlorahexidine gluconate) soap before surgery.  CHG is an antiseptic cleaner which kills germs and bonds with the skin to continue killing germs even after washing.  Please DO NOT use if you have an allergy to CHG or antibacterial soaps.  If your skin becomes reddened/irritated stop using the CHG and inform your nurse when you arrive at Short Stay.  Do not shave (including legs and underarms) for at least 48 hours prior to the first CHG shower.  You may shave your face.  Please follow these instructions carefully:   1.  Shower with CHG Soap the night before surgery and the                    morning of Surgery.  2.  If you choose to wash your hair, wash your hair  first as usual with your       normal shampoo.  3.  After you shampoo, rinse your hair and body thoroughly to remove the shampoo.  4.  Use CHG as you would any other liquid soap.  You can apply chg directly       to the skin and wash gently with scrungie or a clean washcloth.  5.  Apply the CHG Soap to your body ONLY FROM THE NECK DOWN.        Do not use on open wounds or open sores.  Avoid contact with your eyes, ears, mouth and genitals (private parts).  Wash genitals (private parts) with your normal soap.  6.  Wash thoroughly, paying special attention to the area where your surgery        will be performed.  7.  Thoroughly rinse your body with warm water from the neck down.  8.  DO NOT shower/wash with your normal soap after using and rinsing off       the CHG Soap.  9.  Pat yourself dry with a clean towel.            10.  Wear clean pajamas.            11 .  Place clean sheets on your bed the  night of your first shower and do not        sleep with pets.  Day of Surgery  Shower as above. Do not apply any lotions/deodorants the morning of surgery.  Please wear clean clothes to the hospital.   Please read over the fact sheets that you were given.

## 2017-06-01 ENCOUNTER — Ambulatory Visit (HOSPITAL_COMMUNITY)
Admission: RE | Admit: 2017-06-01 | Discharge: 2017-06-01 | Disposition: A | Discharge status: Home / Self Care | Payer: 59 | Source: Ambulatory Visit | Attending: Cardiothoracic Surgery | Admitting: Cardiothoracic Surgery

## 2017-06-01 ENCOUNTER — Encounter (HOSPITAL_COMMUNITY): Payer: Self-pay

## 2017-06-01 ENCOUNTER — Ambulatory Visit (HOSPITAL_BASED_OUTPATIENT_CLINIC_OR_DEPARTMENT_OTHER)
Admission: RE | Admit: 2017-06-01 | Discharge: 2017-06-01 | Disposition: A | Discharge status: Home / Self Care | Payer: 59 | Source: Ambulatory Visit | Attending: Cardiothoracic Surgery | Admitting: Cardiothoracic Surgery

## 2017-06-01 DIAGNOSIS — I2510 Atherosclerotic heart disease of native coronary artery without angina pectoris: Secondary | ICD-10-CM | POA: Insufficient documentation

## 2017-06-01 DIAGNOSIS — Z736 Limitation of activities due to disability: Secondary | ICD-10-CM

## 2017-06-01 LAB — PULMONARY FUNCTION TEST
DL/VA % pred: 105 %
DL/VA: 4.66 ml/min/mmHg/L
DLCO cor % pred: 84 %
DLCO cor: 24.01 ml/min/mmHg
DLCO unc % pred: 85 %
DLCO unc: 24.21 ml/min/mmHg
FEF 25-75 Post: 0.85 L/sec
FEF 25-75 Pre: 0.5 L/sec
FEF2575-%Change-Post: 69 %
FEF2575-%Pred-Post: 33 %
FEF2575-%Pred-Pre: 19 %
FEV1-%Change-Post: 14 %
FEV1-%Pred-Post: 54 %
FEV1-%Pred-Pre: 47 %
FEV1-Post: 1.7 L
FEV1-Pre: 1.48 L
FEV1FVC-%Change-Post: 0 %
FEV1FVC-%Pred-Pre: 64 %
FEV6-%Change-Post: 19 %
FEV6-%Pred-Post: 83 %
FEV6-%Pred-Pre: 69 %
FEV6-Post: 3.28 L
FEV6-Pre: 2.73 L
FEV6FVC-%Change-Post: 3 %
FEV6FVC-%Pred-Post: 97 %
FEV6FVC-%Pred-Pre: 93 %
FVC-%Change-Post: 15 %
FVC-%Pred-Post: 85 %
FVC-%Pred-Pre: 74 %
FVC-Post: 3.55 L
FVC-Pre: 3.07 L
Post FEV1/FVC ratio: 48 %
Post FEV6/FVC ratio: 92 %
Pre FEV1/FVC ratio: 48 %
Pre FEV6/FVC Ratio: 89 %
RV % pred: 182 %
RV: 3.95 L
TLC % pred: 114 %
TLC: 7.31 L

## 2017-06-01 MED ORDER — ALBUTEROL SULFATE (2.5 MG/3ML) 0.083% IN NEBU
2.5000 mg | INHALATION_SOLUTION | Freq: Once | RESPIRATORY_TRACT | Status: AC
  Administered 2017-06-01: 2.5 mg via RESPIRATORY_TRACT

## 2017-06-01 NOTE — Progress Notes (Signed)
Anesthesia Chart Review: Patient is a 62 year old male scheduled for CABG on 06/04/17 by Dr. Tharon Aquas Trigt.  History includes never smoker, CAD, hypertension, COPD, asthma, OSA (CPAP), GERD, PTSD, arthritis, right knee arthroscopy 04/29/08. He reported a knee surgery at the [facility] in which he "stopped breathing." It sounds like it may have been a combination of sedation and OSA, as he told his PAT RN that he was told to use his CPAP. (Anesthesia records requested, but are pending.)  PCP is through the [facility]. Cardiologist is Dr. Quay Burow.  Meds include ASA 81 mg, HCTZ, losartan, Lopressor, Nitro, Prilosec.   BP (!) 125/59   Pulse 68   Temp 36.6 C   Resp 18   Ht 5\' 7"  (1.702 m)   Wt 164 lb 1.6 oz (74.4 kg)   SpO2 97%   BMI 25.70 kg/m   Cardiac cath 05/28/17:  Prox LAD to Mid LAD lesion is 95% stenosed.  Ost 1st Diag to 1st Diag lesion is 80% stenosed.  Mid LAD lesion is 70% stenosed.  Ost 1st Mrg to 1st Mrg lesion is 90% stenosed.  Mid Cx to Dist Cx lesion is 90% stenosed.  Dist RCA lesion is 40% stenosed.  The left ventricular systolic function is normal.  LV end diastolic pressure is normal.  The left ventricular ejection fraction is 55-65% by visual estimate.  Carotid U/S (Preliminary) 06/01/17: 1% to 39% ICA stenosis bilaterally. Vertebral artey flow is antegrade.   Chest CT 05/31/17: IMPRESSION: 1. Nonspecific diffuse bronchial wall thickening, which can be seen with reactive airways disease and/or chronic bronchitis. 2. Otherwise no active pulmonary disease. 3. Three-vessel coronary atherosclerosis. Aortic Atherosclerosis (ICD10-I70.0).  Preoperative EKG and CXR noted.   PFTS 06/01/17: FVC 3.07 (74%), FEV1 1.48 (47%), DLCO unc 24.21 (85%).    Preoperative labs noted. Cr 0.92. CBC WNL. INR 0.82. PTT 26. A1c 5.4.   If no acute changes and anticipate that he can proceed as planned.  George Hugh [facility] Short Stay  Center/Anesthesiology Phone [phone] 06/01/2017 3:26 PM

## 2017-06-01 NOTE — Progress Notes (Signed)
Pre CABG Dopplers completed. 1% to 39% ICA stenosis bilaterally. Vertebral artey flow is antegrade. ABIs and Doppler waveforms are within normal limits bilaterally at rest. Palmar arch is normal bilaterally. [facility], [place] 06/01/2017, 3:15 PM

## 2017-06-03 ENCOUNTER — Encounter (HOSPITAL_COMMUNITY): Payer: Self-pay | Admitting: Anesthesiology

## 2017-06-03 MED ORDER — INSULIN REGULAR HUMAN 100 UNIT/ML IJ SOLN
INTRAMUSCULAR | Status: DC
  Filled 2017-06-03: qty 1

## 2017-06-03 MED ORDER — DEXMEDETOMIDINE HCL IN NACL 400 MCG/100ML IV SOLN
0.1000 ug/kg/h | INTRAVENOUS | Status: DC
  Filled 2017-06-03: qty 100

## 2017-06-03 MED ORDER — TRANEXAMIC ACID (OHS) BOLUS VIA INFUSION
15.0000 mg/kg | INTRAVENOUS | Status: AC
  Administered 2017-06-04: 1116 mg via INTRAVENOUS
  Filled 2017-06-03: qty 1116

## 2017-06-03 MED ORDER — SODIUM CHLORIDE 0.9 % IV SOLN
30.0000 ug/min | INTRAVENOUS | Status: DC
  Filled 2017-06-03: qty 2

## 2017-06-03 MED ORDER — TRANEXAMIC ACID (OHS) PUMP PRIME SOLUTION
2.0000 mg/kg | INTRAVENOUS | Status: DC
  Filled 2017-06-03: qty 1.49

## 2017-06-03 MED ORDER — SODIUM CHLORIDE 0.9 % IV SOLN
INTRAVENOUS | Status: DC
  Filled 2017-06-03: qty 30

## 2017-06-03 MED ORDER — DOPAMINE-DEXTROSE 3.2-5 MG/ML-% IV SOLN
0.0000 ug/kg/min | INTRAVENOUS | Status: DC
  Filled 2017-06-03: qty 250

## 2017-06-03 MED ORDER — DEXTROSE 5 % IV SOLN
0.0000 ug/min | INTRAVENOUS | Status: DC
  Filled 2017-06-03: qty 4

## 2017-06-03 MED ORDER — NITROGLYCERIN IN D5W 200-5 MCG/ML-% IV SOLN
2.0000 ug/min | INTRAVENOUS | Status: DC
  Filled 2017-06-03: qty 250

## 2017-06-03 MED ORDER — VANCOMYCIN HCL 10 G IV SOLR
1250.0000 mg | INTRAVENOUS | Status: AC
  Administered 2017-06-04: 1250 mg via INTRAVENOUS
  Filled 2017-06-03: qty 1250

## 2017-06-03 MED ORDER — SODIUM CHLORIDE 0.9 % IV SOLN
1.5000 g | INTRAVENOUS | Status: AC
  Administered 2017-06-04: 1.5 g via INTRAVENOUS
  Administered 2017-06-04: .75 g via INTRAVENOUS
  Filled 2017-06-03 (×2): qty 1.5

## 2017-06-03 MED ORDER — SODIUM CHLORIDE 0.9 % IV SOLN
750.0000 mg | INTRAVENOUS | Status: DC
  Filled 2017-06-03: qty 750

## 2017-06-03 MED ORDER — TRANEXAMIC ACID 1000 MG/10ML IV SOLN
1.5000 mg/kg/h | INTRAVENOUS | Status: AC
  Administered 2017-06-04: 1.5 mg/kg/h via INTRAVENOUS
  Filled 2017-06-03: qty 25

## 2017-06-03 MED ORDER — POTASSIUM CHLORIDE 2 MEQ/ML IV SOLN
80.0000 meq | INTRAVENOUS | Status: DC
  Filled 2017-06-03: qty 40

## 2017-06-03 MED ORDER — MAGNESIUM SULFATE 50 % IJ SOLN
40.0000 meq | INTRAMUSCULAR | Status: DC
Start: 2017-06-04 — End: 2017-06-04
  Filled 2017-06-03 (×2): qty 9.85

## 2017-06-03 MED ORDER — PLASMA-LYTE 148 IV SOLN
INTRAVENOUS | Status: AC
Start: 2017-06-04 — End: 2017-06-04
  Administered 2017-06-04: 500 mL
  Filled 2017-06-03: qty 2.5

## 2017-06-03 NOTE — Anesthesia Preprocedure Evaluation (Addendum)
Anesthesia Evaluation  Patient identified by MRN, date of birth, ID band Patient awake    Reviewed: Allergy & Precautions, NPO status , Patient's Chart, lab work & pertinent test results, reviewed documented beta blocker date and time   Airway Mallampati: II  TM Distance: >3 FB Neck ROM: Full    Dental  (+) Teeth Intact, Dental Advisory Given   Pulmonary asthma , sleep apnea and Continuous Positive Airway Pressure Ventilation , COPD,    breath sounds clear to auscultation       Cardiovascular hypertension, Pt. on medications and Pt. on home beta blockers + CAD   Rhythm:Regular Rate:Normal     Neuro/Psych PSYCHIATRIC DISORDERS Anxiety negative neurological ROS     GI/Hepatic Neg liver ROS, GERD  Medicated,  Endo/Other  negative endocrine ROS  Renal/GU negative Renal ROS  negative genitourinary   Musculoskeletal  (+) Arthritis ,   Abdominal Normal abdominal exam  (+)   Peds  Hematology negative hematology ROS (+)   Anesthesia Other Findings   Reproductive/Obstetrics negative OB ROS                           Lab Results  Component Value Date   WBC 5.2 05/31/2017   HGB 14.9 05/31/2017   HCT 42.4 05/31/2017   MCV 93.4 05/31/2017   PLT 236 05/31/2017   Lab Results  Component Value Date   CREATININE 0.92 05/31/2017   BUN 13 05/31/2017   NA 135 05/31/2017   K 4.3 05/31/2017   CL 101 05/31/2017   CO2 22 05/31/2017   Lab Results  Component Value Date   INR 0.82 05/31/2017   INR 0.9 05/24/2017   EKG: NSR  Cath Report:  Prox LAD to Mid LAD lesion is 95% stenosed.  Ost 1st Diag to 1st Diag lesion is 80% stenosed.  Mid LAD lesion is 70% stenosed.  Ost 1st Mrg to 1st Mrg lesion is 90% stenosed.  Mid Cx to Dist Cx lesion is 90% stenosed.  Dist RCA lesion is 40% stenosed.  The left ventricular systolic function is normal.  LV end diastolic pressure is normal.  The left  ventricular ejection fraction is 55-65% by visual estimate.     Anesthesia Physical Anesthesia Plan  ASA: IV  Anesthesia Plan: General   Post-op Pain Management:    Induction: Intravenous  PONV Risk Score and Plan: 3 and Treatment may vary due to age or medical condition  Airway Management Planned: Oral ETT  Additional Equipment: Arterial line, CVP, PA Cath, TEE and Ultrasound Guidance Line Placement  Intra-op Plan:   Post-operative Plan: Post-operative intubation/ventilation  Informed Consent: I have reviewed the patients History and Physical, chart, labs and discussed the procedure including the risks, benefits and alternatives for the proposed anesthesia with the patient or authorized representative who has indicated his/her understanding and acceptance.   Dental advisory given  Plan Discussed with: CRNA  Anesthesia Plan Comments:        Anesthesia Quick Evaluation

## 2017-06-04 ENCOUNTER — Inpatient Hospital Stay (HOSPITAL_COMMUNITY): Payer: 59 | Admitting: Vascular Surgery

## 2017-06-04 ENCOUNTER — Inpatient Hospital Stay (HOSPITAL_COMMUNITY): Payer: 59 | Admitting: Anesthesiology

## 2017-06-04 ENCOUNTER — Inpatient Hospital Stay (HOSPITAL_COMMUNITY)
Admission: RE | Admit: 2017-06-04 | Discharge: 2017-06-10 | DRG: 236 | Disposition: A | Discharge status: Home / Self Care | Payer: 59 | Source: Ambulatory Visit | Attending: Cardiothoracic Surgery | Admitting: Cardiothoracic Surgery

## 2017-06-04 ENCOUNTER — Encounter (HOSPITAL_COMMUNITY): Payer: Self-pay | Admitting: Urology

## 2017-06-04 ENCOUNTER — Inpatient Hospital Stay (HOSPITAL_COMMUNITY): Payer: 59

## 2017-06-04 ENCOUNTER — Inpatient Hospital Stay (HOSPITAL_COMMUNITY)
Admission: RE | Disposition: A | Discharge status: Home / Self Care | Payer: Self-pay | Source: Ambulatory Visit | Attending: Cardiothoracic Surgery

## 2017-06-04 ENCOUNTER — Other Ambulatory Visit: Payer: Self-pay

## 2017-06-04 DIAGNOSIS — E8770 Fluid overload, unspecified: Secondary | ICD-10-CM | POA: Diagnosis not present

## 2017-06-04 DIAGNOSIS — I319 Disease of pericardium, unspecified: Secondary | ICD-10-CM | POA: Diagnosis not present

## 2017-06-04 DIAGNOSIS — K5900 Constipation, unspecified: Secondary | ICD-10-CM | POA: Diagnosis not present

## 2017-06-04 DIAGNOSIS — K219 Gastro-esophageal reflux disease without esophagitis: Secondary | ICD-10-CM | POA: Diagnosis present

## 2017-06-04 DIAGNOSIS — G4733 Obstructive sleep apnea (adult) (pediatric): Secondary | ICD-10-CM | POA: Diagnosis present

## 2017-06-04 DIAGNOSIS — Z8249 Family history of ischemic heart disease and other diseases of the circulatory system: Secondary | ICD-10-CM | POA: Diagnosis not present

## 2017-06-04 DIAGNOSIS — J939 Pneumothorax, unspecified: Secondary | ICD-10-CM

## 2017-06-04 DIAGNOSIS — F4310 Post-traumatic stress disorder, unspecified: Secondary | ICD-10-CM | POA: Diagnosis present

## 2017-06-04 DIAGNOSIS — I2510 Atherosclerotic heart disease of native coronary artery without angina pectoris: Principal | ICD-10-CM | POA: Diagnosis present

## 2017-06-04 DIAGNOSIS — F419 Anxiety disorder, unspecified: Secondary | ICD-10-CM | POA: Diagnosis present

## 2017-06-04 DIAGNOSIS — D696 Thrombocytopenia, unspecified: Secondary | ICD-10-CM | POA: Diagnosis not present

## 2017-06-04 DIAGNOSIS — D62 Acute posthemorrhagic anemia: Secondary | ICD-10-CM | POA: Diagnosis not present

## 2017-06-04 DIAGNOSIS — E785 Hyperlipidemia, unspecified: Secondary | ICD-10-CM | POA: Diagnosis present

## 2017-06-04 DIAGNOSIS — J449 Chronic obstructive pulmonary disease, unspecified: Secondary | ICD-10-CM | POA: Diagnosis present

## 2017-06-04 DIAGNOSIS — I10 Essential (primary) hypertension: Secondary | ICD-10-CM | POA: Diagnosis present

## 2017-06-04 DIAGNOSIS — I25110 Atherosclerotic heart disease of native coronary artery with unstable angina pectoris: Secondary | ICD-10-CM

## 2017-06-04 DIAGNOSIS — Z951 Presence of aortocoronary bypass graft: Secondary | ICD-10-CM

## 2017-06-04 HISTORY — PX: CORONARY ARTERY BYPASS GRAFT: SHX141

## 2017-06-04 HISTORY — PX: TEE WITHOUT CARDIOVERSION: SHX5443

## 2017-06-04 LAB — POCT I-STAT 3, ART BLOOD GAS (G3+)
Acid-base deficit: 1 mmol/L (ref 0.0–2.0)
Acid-base deficit: 3 mmol/L — ABNORMAL HIGH (ref 0.0–2.0)
Acid-base deficit: 4 mmol/L — ABNORMAL HIGH (ref 0.0–2.0)
Bicarbonate: 24.7 mmol/L (ref 20.0–28.0)
Bicarbonate: 23.2 mmol/L (ref 20.0–28.0)
Bicarbonate: 21.6 mmol/L (ref 20.0–28.0)
O2 Saturation: 100 %
O2 Saturation: 98 %
O2 Saturation: 98 %
Patient temperature: 35.8
Patient temperature: 37.7
TCO2: 26 mmol/L (ref 22–32)
TCO2: 25 mmol/L (ref 22–32)
TCO2: 23 mmol/L (ref 22–32)
pCO2 arterial: 43.4 mmHg (ref 32.0–48.0)
pCO2 arterial: 47.2 mmHg (ref 32.0–48.0)
pCO2 arterial: 38.8 mmHg (ref 32.0–48.0)
pH, Arterial: 7.358 (ref 7.350–7.450)
pH, Arterial: 7.304 — ABNORMAL LOW (ref 7.350–7.450)
pH, Arterial: 7.355 (ref 7.350–7.450)
pO2, Arterial: 194 mmHg — ABNORMAL HIGH (ref 83.0–108.0)
pO2, Arterial: 128 mmHg — ABNORMAL HIGH (ref 83.0–108.0)
pO2, Arterial: 107 mmHg (ref 83.0–108.0)

## 2017-06-04 LAB — POCT I-STAT, CHEM 8
BUN: 9 mg/dL (ref 6–20)
Calcium, Ion: 1.12 mmol/L — ABNORMAL LOW (ref 1.15–1.40)
Chloride: 101 mmol/L (ref 101–111)
Creatinine, Ser: 0.8 mg/dL (ref 0.61–1.24)
Glucose, Bld: 172 mg/dL — ABNORMAL HIGH (ref 65–99)
HCT: 33 % — ABNORMAL LOW (ref 39.0–52.0)
Hemoglobin: 11.2 g/dL — ABNORMAL LOW (ref 13.0–17.0)
Potassium: 4.4 mmol/L (ref 3.5–5.1)
Sodium: 138 mmol/L (ref 135–145)
TCO2: 25 mmol/L (ref 22–32)

## 2017-06-04 LAB — PLATELET COUNT: Platelets: 184 10*3/uL (ref 150–400)

## 2017-06-04 LAB — GLUCOSE, CAPILLARY
Glucose-Capillary: 117 mg/dL — ABNORMAL HIGH (ref 65–99)
Glucose-Capillary: 113 mg/dL — ABNORMAL HIGH (ref 65–99)
Glucose-Capillary: 83 mg/dL (ref 65–99)

## 2017-06-04 LAB — CBC
HCT: 36.4 % — ABNORMAL LOW (ref 39.0–52.0)
HCT: 32.6 % — ABNORMAL LOW (ref 39.0–52.0)
Hemoglobin: 12.3 g/dL — ABNORMAL LOW (ref 13.0–17.0)
Hemoglobin: 11.3 g/dL — ABNORMAL LOW (ref 13.0–17.0)
MCH: 31.5 pg (ref 26.0–34.0)
MCH: 32.3 pg (ref 26.0–34.0)
MCHC: 33.8 g/dL (ref 30.0–36.0)
MCHC: 34.7 g/dL (ref 30.0–36.0)
MCV: 93.1 fL (ref 78.0–100.0)
MCV: 93.1 fL (ref 78.0–100.0)
PLATELETS: 162 10*3/uL (ref 150–400)
Platelets: 158 10*3/uL (ref 150–400)
RBC: 3.91 MIL/uL — AB (ref 4.22–5.81)
RBC: 3.5 MIL/uL — ABNORMAL LOW (ref 4.22–5.81)
RDW: 12.8 % (ref 11.5–15.5)
RDW: 13.1 % (ref 11.5–15.5)
WBC: 8.1 10*3/uL (ref 4.0–10.5)
WBC: 7.7 10*3/uL (ref 4.0–10.5)

## 2017-06-04 LAB — CREATININE, SERUM
Creatinine, Ser: 0.86 mg/dL (ref 0.61–1.24)
GFR calc Af Amer: >60 mL/min (ref >60)
GFR calc non Af Amer: >60 mL/min (ref >60)

## 2017-06-04 LAB — HEMOGLOBIN AND HEMATOCRIT, BLOOD
HCT: 31 % — ABNORMAL LOW (ref 39.0–52.0)
Hemoglobin: 10.4 g/dL — ABNORMAL LOW (ref 13.0–17.0)

## 2017-06-04 LAB — POCT I-STAT 4, (NA,K, GLUC, HGB,HCT)
Glucose, Bld: 112 mg/dL — ABNORMAL HIGH (ref 65–99)
HCT: 35 % — ABNORMAL LOW (ref 39.0–52.0)
Hemoglobin: 11.9 g/dL — ABNORMAL LOW (ref 13.0–17.0)
Potassium: 4.2 mmol/L (ref 3.5–5.1)
Sodium: 140 mmol/L (ref 135–145)

## 2017-06-04 LAB — PROTIME-INR
INR: 1.17
PROTHROMBIN TIME: 14.8 s (ref 11.4–15.2)

## 2017-06-04 LAB — APTT: APTT: 29 s (ref 24–36)

## 2017-06-04 LAB — MAGNESIUM: Magnesium: 2.7 mg/dL — ABNORMAL HIGH (ref 1.7–2.4)

## 2017-06-04 SURGERY — CORONARY ARTERY BYPASS GRAFTING (CABG)
Anesthesia: General | Site: Chest

## 2017-06-04 MED ORDER — SODIUM CHLORIDE 0.9 % IV SOLN: INTRAVENOUS | Status: DC

## 2017-06-04 MED ORDER — METOPROLOL TARTRATE 12.5 MG HALF TABLET
12.5000 mg | ORAL_TABLET | Freq: Two times a day (BID) | ORAL | Status: DC
  Administered 2017-06-05 – 2017-06-06 (×3): 12.5 mg via ORAL
  Filled 2017-06-04 (×4): qty 1

## 2017-06-04 MED ORDER — ACETAMINOPHEN 650 MG RE SUPP
650.0000 mg | Freq: Once | RECTAL | Status: AC
  Administered 2017-06-04: 650 mg via RECTAL

## 2017-06-04 MED ORDER — FENTANYL CITRATE (PF) 250 MCG/5ML IJ SOLN
INTRAMUSCULAR | Status: AC
  Filled 2017-06-04: qty 5

## 2017-06-04 MED ORDER — ORAL CARE MOUTH RINSE: 15.0000 mL | Freq: Four times a day (QID) | OROMUCOSAL | Status: DC

## 2017-06-04 MED ORDER — ONDANSETRON HCL 4 MG/2ML IJ SOLN: 4.0000 mg | Freq: Four times a day (QID) | INTRAMUSCULAR | Status: DC | PRN

## 2017-06-04 MED ORDER — VANCOMYCIN HCL IN DEXTROSE 1-5 GM/200ML-% IV SOLN
1000.0000 mg | Freq: Once | INTRAVENOUS | Status: AC
  Administered 2017-06-04: 1000 mg via INTRAVENOUS
  Filled 2017-06-04: qty 200

## 2017-06-04 MED ORDER — PHENYLEPHRINE 40 MCG/ML (10ML) SYRINGE FOR IV PUSH (FOR BLOOD PRESSURE SUPPORT)
PREFILLED_SYRINGE | INTRAVENOUS | Status: DC | PRN
  Administered 2017-06-04 (×2): 120 ug via INTRAVENOUS
  Administered 2017-06-04: 40 ug via INTRAVENOUS

## 2017-06-04 MED ORDER — ROCURONIUM BROMIDE 10 MG/ML (PF) SYRINGE
PREFILLED_SYRINGE | INTRAVENOUS | Status: AC
  Filled 2017-06-04: qty 5

## 2017-06-04 MED ORDER — GLYCOPYRROLATE 0.2 MG/ML IJ SOLN
INTRAMUSCULAR | Status: DC | PRN
  Administered 2017-06-04: 0.2 mg via INTRAVENOUS

## 2017-06-04 MED ORDER — SODIUM CHLORIDE 0.9 % IV SOLN
INTRAVENOUS | Status: DC | PRN
  Administered 2017-06-04: 1.1 [IU]/h via INTRAVENOUS

## 2017-06-04 MED ORDER — SODIUM CHLORIDE 0.9% FLUSH
3.0000 mL | Freq: Two times a day (BID) | INTRAVENOUS | Status: DC
  Administered 2017-06-05 – 2017-06-10 (×10): 3 mL via INTRAVENOUS

## 2017-06-04 MED ORDER — SODIUM CHLORIDE 0.45 % IV SOLN
INTRAVENOUS | Status: DC | PRN
  Administered 2017-06-04: 20 mL/h via INTRAVENOUS
  Administered 2017-06-05: 17:00:00 via INTRAVENOUS

## 2017-06-04 MED ORDER — FENTANYL CITRATE (PF) 250 MCG/5ML IJ SOLN
INTRAMUSCULAR | Status: AC
  Filled 2017-06-04: qty 25

## 2017-06-04 MED ORDER — HEMOSTATIC AGENTS (NO CHARGE) OPTIME
TOPICAL | Status: DC | PRN
  Administered 2017-06-04 (×2): 1 via TOPICAL

## 2017-06-04 MED ORDER — CHLORHEXIDINE GLUCONATE 0.12 % MT SOLN
15.0000 mL | Freq: Once | OROMUCOSAL | Status: AC
  Administered 2017-06-04: 15 mL via OROMUCOSAL

## 2017-06-04 MED ORDER — METOPROLOL TARTRATE 5 MG/5ML IV SOLN: 2.5000 mg | INTRAVENOUS | Status: DC | PRN

## 2017-06-04 MED ORDER — PROTAMINE SULFATE 10 MG/ML IV SOLN
INTRAVENOUS | Status: AC
  Filled 2017-06-04: qty 5

## 2017-06-04 MED ORDER — LACTATED RINGERS IV SOLN
INTRAVENOUS | Status: DC | PRN
  Administered 2017-06-04 (×2): via INTRAVENOUS

## 2017-06-04 MED ORDER — PHENYLEPHRINE HCL 10 MG/ML IJ SOLN
INTRAVENOUS | Status: DC | PRN
  Administered 2017-06-04: 80 ug/min via INTRAVENOUS

## 2017-06-04 MED ORDER — INSULIN REGULAR BOLUS VIA INFUSION
0.0000 [IU] | Freq: Three times a day (TID) | INTRAVENOUS | Status: DC
  Filled 2017-06-04: qty 10

## 2017-06-04 MED ORDER — THROMBIN 5000 UNITS EX SOLR
CUTANEOUS | Status: AC
  Filled 2017-06-04: qty 5000

## 2017-06-04 MED ORDER — ONDANSETRON HCL 4 MG/2ML IJ SOLN
INTRAMUSCULAR | Status: AC
  Filled 2017-06-04: qty 2

## 2017-06-04 MED ORDER — HEPARIN SODIUM (PORCINE) 1000 UNIT/ML IJ SOLN
INTRAMUSCULAR | Status: DC | PRN
  Administered 2017-06-04 (×2): 2 mL via INTRAVENOUS
  Administered 2017-06-04: 22 mL via INTRAVENOUS

## 2017-06-04 MED ORDER — LIDOCAINE HCL (CARDIAC) 20 MG/ML IV SOLN
INTRAVENOUS | Status: AC
  Filled 2017-06-04: qty 5

## 2017-06-04 MED ORDER — CHLORHEXIDINE GLUCONATE CLOTH 2 % EX PADS
6.0000 | MEDICATED_PAD | Freq: Every day | CUTANEOUS | Status: DC
  Administered 2017-06-04 – 2017-06-05 (×2): 6 via TOPICAL

## 2017-06-04 MED ORDER — PROTAMINE SULFATE 10 MG/ML IV SOLN
INTRAVENOUS | Status: AC
  Filled 2017-06-04: qty 25

## 2017-06-04 MED ORDER — SODIUM CHLORIDE 0.9% FLUSH: 3.0000 mL | INTRAVENOUS | Status: DC | PRN

## 2017-06-04 MED ORDER — MIDAZOLAM HCL 5 MG/5ML IJ SOLN
INTRAMUSCULAR | Status: DC | PRN
  Administered 2017-06-04: 2 mg via INTRAVENOUS
  Administered 2017-06-04: 1 mg via INTRAVENOUS
  Administered 2017-06-04: 2 mg via INTRAVENOUS

## 2017-06-04 MED ORDER — POTASSIUM CHLORIDE 10 MEQ/50ML IV SOLN: 10.0000 meq | INTRAVENOUS | Status: AC

## 2017-06-04 MED ORDER — DEXAMETHASONE SODIUM PHOSPHATE 10 MG/ML IJ SOLN
INTRAMUSCULAR | Status: AC
  Filled 2017-06-04: qty 1

## 2017-06-04 MED ORDER — ACETAMINOPHEN 500 MG PO TABS
1000.0000 mg | ORAL_TABLET | Freq: Four times a day (QID) | ORAL | Status: AC
  Administered 2017-06-05 – 2017-06-09 (×17): 1000 mg via ORAL
  Filled 2017-06-04 (×18): qty 2

## 2017-06-04 MED ORDER — ARTIFICIAL TEARS OPHTHALMIC OINT
TOPICAL_OINTMENT | OPHTHALMIC | Status: DC | PRN
  Administered 2017-06-04: 1 via OPHTHALMIC

## 2017-06-04 MED ORDER — KETOROLAC TROMETHAMINE 15 MG/ML IJ SOLN
15.0000 mg | Freq: Four times a day (QID) | INTRAMUSCULAR | Status: AC
  Administered 2017-06-04 – 2017-06-05 (×4): 15 mg via INTRAVENOUS
  Filled 2017-06-04 (×4): qty 1

## 2017-06-04 MED ORDER — DEXMEDETOMIDINE HCL IN NACL 200 MCG/50ML IV SOLN: 0.0000 ug/kg/h | INTRAVENOUS | Status: DC

## 2017-06-04 MED ORDER — MIDAZOLAM HCL 10 MG/2ML IJ SOLN
INTRAMUSCULAR | Status: AC
  Filled 2017-06-04: qty 2

## 2017-06-04 MED ORDER — CEFAZOLIN SODIUM-DEXTROSE 2-4 GM/100ML-% IV SOLN
2.0000 g | Freq: Three times a day (TID) | INTRAVENOUS | Status: AC
  Administered 2017-06-04 – 2017-06-06 (×6): 2 g via INTRAVENOUS
  Filled 2017-06-04 (×6): qty 100

## 2017-06-04 MED ORDER — MILRINONE LACTATE IN DEXTROSE 20-5 MG/100ML-% IV SOLN
0.1250 ug/kg/min | INTRAVENOUS | Status: AC
  Administered 2017-06-04: .25 ug/kg/min via INTRAVENOUS
  Filled 2017-06-04: qty 100

## 2017-06-04 MED ORDER — PANTOPRAZOLE SODIUM 40 MG PO TBEC
40.0000 mg | DELAYED_RELEASE_TABLET | Freq: Every day | ORAL | Status: DC
Start: 2017-06-05 — End: 2017-06-10
  Administered 2017-06-05 – 2017-06-10 (×6): 40 mg via ORAL
  Filled 2017-06-04 (×6): qty 1

## 2017-06-04 MED ORDER — PROTAMINE SULFATE 10 MG/ML IV SOLN
INTRAVENOUS | Status: DC | PRN
  Administered 2017-06-04: 110 mg via INTRAVENOUS
  Administered 2017-06-04: 20 mg via INTRAVENOUS
  Administered 2017-06-04: 130 mg via INTRAVENOUS

## 2017-06-04 MED ORDER — MORPHINE SULFATE (PF) 2 MG/ML IV SOLN
2.0000 mg | INTRAVENOUS | Status: DC | PRN
  Administered 2017-06-05 – 2017-06-06 (×2): 2 mg via INTRAVENOUS
  Filled 2017-06-04 (×3): qty 1

## 2017-06-04 MED ORDER — ROCURONIUM BROMIDE 100 MG/10ML IV SOLN
INTRAVENOUS | Status: DC | PRN
  Administered 2017-06-04 (×2): 50 mg via INTRAVENOUS
  Administered 2017-06-04: 30 mg via INTRAVENOUS
  Administered 2017-06-04: 50 mg via INTRAVENOUS
  Administered 2017-06-04: 70 mg via INTRAVENOUS
  Administered 2017-06-04: 50 mg via INTRAVENOUS

## 2017-06-04 MED ORDER — HEPARIN SODIUM (PORCINE) 1000 UNIT/ML IJ SOLN
INTRAMUSCULAR | Status: AC
  Filled 2017-06-04: qty 1

## 2017-06-04 MED ORDER — LACTATED RINGERS IV SOLN: 500.0000 mL | Freq: Once | INTRAVENOUS | Status: DC | PRN

## 2017-06-04 MED ORDER — ASPIRIN 81 MG PO CHEW
324.0000 mg | CHEWABLE_TABLET | Freq: Every day | ORAL | Status: DC
  Administered 2017-06-08: 324 mg
  Filled 2017-06-04: qty 4

## 2017-06-04 MED ORDER — ASPIRIN EC 325 MG PO TBEC
325.0000 mg | DELAYED_RELEASE_TABLET | Freq: Every day | ORAL | Status: DC
  Administered 2017-06-05 – 2017-06-10 (×5): 325 mg via ORAL
  Filled 2017-06-04 (×5): qty 1

## 2017-06-04 MED ORDER — 0.9 % SODIUM CHLORIDE (POUR BTL) OPTIME
TOPICAL | Status: DC | PRN
  Administered 2017-06-04: 5000 mL

## 2017-06-04 MED ORDER — ALBUMIN HUMAN 5 % IV SOLN
INTRAVENOUS | Status: DC | PRN
  Administered 2017-06-04: 13:00:00 via INTRAVENOUS

## 2017-06-04 MED ORDER — LACTATED RINGERS IV SOLN
INTRAVENOUS | Status: DC | PRN
  Administered 2017-06-04 (×2): via INTRAVENOUS

## 2017-06-04 MED ORDER — CHLORHEXIDINE GLUCONATE 0.12% ORAL RINSE (MEDLINE KIT)
15.0000 mL | Freq: Two times a day (BID) | OROMUCOSAL | Status: DC
  Administered 2017-06-04: 15 mL via OROMUCOSAL

## 2017-06-04 MED ORDER — PHENYLEPHRINE HCL 10 MG/ML IJ SOLN
INTRAVENOUS | Status: DC | PRN
  Administered 2017-06-04: 30 ug/min via INTRAVENOUS

## 2017-06-04 MED ORDER — METOPROLOL TARTRATE 25 MG/10 ML ORAL SUSPENSION: 12.5000 mg | Freq: Two times a day (BID) | ORAL | Status: DC

## 2017-06-04 MED ORDER — TRAMADOL HCL 50 MG PO TABS
50.0000 mg | ORAL_TABLET | ORAL | Status: DC | PRN
  Administered 2017-06-06: 50 mg via ORAL
  Filled 2017-06-04: qty 1

## 2017-06-04 MED ORDER — LACTATED RINGERS IV SOLN
INTRAVENOUS | Status: DC
  Administered 2017-06-05: 20 mL via INTRAVENOUS

## 2017-06-04 MED ORDER — CHLORHEXIDINE GLUCONATE 4 % EX LIQD: 30.0000 mL | CUTANEOUS | Status: DC

## 2017-06-04 MED ORDER — CHLORHEXIDINE GLUCONATE 0.12 % MT SOLN
OROMUCOSAL | Status: AC
  Administered 2017-06-04: 15 mL via OROMUCOSAL
  Filled 2017-06-04: qty 15

## 2017-06-04 MED ORDER — ARTIFICIAL TEARS OPHTHALMIC OINT
TOPICAL_OINTMENT | OPHTHALMIC | Status: AC
  Filled 2017-06-04: qty 3.5

## 2017-06-04 MED ORDER — LACTATED RINGERS IV SOLN: INTRAVENOUS | Status: DC

## 2017-06-04 MED ORDER — ACETAMINOPHEN 160 MG/5ML PO SOLN: 1000.0000 mg | Freq: Four times a day (QID) | ORAL | Status: AC

## 2017-06-04 MED ORDER — ALBUMIN HUMAN 5 % IV SOLN
250.0000 mL | INTRAVENOUS | Status: AC | PRN
  Administered 2017-06-04 – 2017-06-05 (×4): 250 mL via INTRAVENOUS
  Filled 2017-06-04 (×2): qty 250

## 2017-06-04 MED ORDER — SODIUM CHLORIDE 0.9 % IV SOLN
0.0000 ug/min | INTRAVENOUS | Status: DC
  Administered 2017-06-05: 45 ug/min via INTRAVENOUS
  Filled 2017-06-04: qty 2
  Filled 2017-06-04: qty 20

## 2017-06-04 MED ORDER — CHLORHEXIDINE GLUCONATE 0.12 % MT SOLN
15.0000 mL | OROMUCOSAL | Status: AC
  Administered 2017-06-04: 15 mL via OROMUCOSAL

## 2017-06-04 MED ORDER — HEMOSTATIC AGENTS (NO CHARGE) OPTIME
TOPICAL | Status: DC | PRN
  Administered 2017-06-04: 1 via TOPICAL

## 2017-06-04 MED ORDER — SODIUM CHLORIDE 0.9 % IJ SOLN
OROMUCOSAL | Status: DC | PRN
  Administered 2017-06-04 (×3): 4 mL via TOPICAL

## 2017-06-04 MED ORDER — PHENYLEPHRINE 40 MCG/ML (10ML) SYRINGE FOR IV PUSH (FOR BLOOD PRESSURE SUPPORT)
PREFILLED_SYRINGE | INTRAVENOUS | Status: AC
  Filled 2017-06-04: qty 10

## 2017-06-04 MED ORDER — DOPAMINE-DEXTROSE 3.2-5 MG/ML-% IV SOLN
INTRAVENOUS | Status: DC | PRN
  Administered 2017-06-04: 3 ug/kg/min via INTRAVENOUS

## 2017-06-04 MED ORDER — METOCLOPRAMIDE HCL 5 MG/ML IJ SOLN
10.0000 mg | Freq: Four times a day (QID) | INTRAMUSCULAR | Status: AC
  Administered 2017-06-04 – 2017-06-08 (×18): 10 mg via INTRAVENOUS
  Filled 2017-06-04 (×18): qty 2

## 2017-06-04 MED ORDER — EPHEDRINE SULFATE-NACL 50-0.9 MG/10ML-% IV SOSY
PREFILLED_SYRINGE | INTRAVENOUS | Status: DC | PRN
  Administered 2017-06-04: 10 mg via INTRAVENOUS

## 2017-06-04 MED ORDER — EPHEDRINE 5 MG/ML INJ
INTRAVENOUS | Status: AC
  Filled 2017-06-04: qty 10

## 2017-06-04 MED ORDER — METOPROLOL TARTRATE 12.5 MG HALF TABLET: 12.5000 mg | ORAL_TABLET | Freq: Once | ORAL | Status: DC

## 2017-06-04 MED ORDER — OXYCODONE HCL 5 MG PO TABS
5.0000 mg | ORAL_TABLET | ORAL | Status: DC | PRN
  Administered 2017-06-04: 5 mg via ORAL
  Administered 2017-06-05 – 2017-06-07 (×7): 10 mg via ORAL
  Administered 2017-06-07: 5 mg via ORAL
  Filled 2017-06-04 (×2): qty 2
  Filled 2017-06-04: qty 1
  Filled 2017-06-04 (×4): qty 2
  Filled 2017-06-04: qty 1
  Filled 2017-06-04 (×2): qty 2

## 2017-06-04 MED ORDER — FENTANYL CITRATE (PF) 250 MCG/5ML IJ SOLN
INTRAMUSCULAR | Status: DC | PRN
  Administered 2017-06-04: 50 ug via INTRAVENOUS
  Administered 2017-06-04: 100 ug via INTRAVENOUS
  Administered 2017-06-04: 200 ug via INTRAVENOUS
  Administered 2017-06-04: 150 ug via INTRAVENOUS
  Administered 2017-06-04 (×2): 100 ug via INTRAVENOUS
  Administered 2017-06-04 (×2): 150 ug via INTRAVENOUS
  Administered 2017-06-04: 100 ug via INTRAVENOUS
  Administered 2017-06-04: 150 ug via INTRAVENOUS
  Administered 2017-06-04: 100 ug via INTRAVENOUS

## 2017-06-04 MED ORDER — BISACODYL 10 MG RE SUPP: 10.0000 mg | Freq: Every day | RECTAL | Status: DC

## 2017-06-04 MED ORDER — INSULIN ASPART 100 UNIT/ML ~~LOC~~ SOLN
0.0000 [IU] | SUBCUTANEOUS | Status: DC
  Administered 2017-06-04 – 2017-06-05 (×3): 2 [IU] via SUBCUTANEOUS

## 2017-06-04 MED ORDER — MAGNESIUM SULFATE 4 GM/100ML IV SOLN
4.0000 g | Freq: Once | INTRAVENOUS | Status: AC
  Administered 2017-06-04: 4 g via INTRAVENOUS
  Filled 2017-06-04: qty 100

## 2017-06-04 MED ORDER — BISACODYL 5 MG PO TBEC
10.0000 mg | DELAYED_RELEASE_TABLET | Freq: Every day | ORAL | Status: DC
  Administered 2017-06-05 – 2017-06-08 (×4): 10 mg via ORAL
  Filled 2017-06-04 (×6): qty 2

## 2017-06-04 MED ORDER — DOPAMINE-DEXTROSE 3.2-5 MG/ML-% IV SOLN: 0.0000 ug/kg/min | INTRAVENOUS | Status: DC

## 2017-06-04 MED ORDER — DOCUSATE SODIUM 100 MG PO CAPS
200.0000 mg | ORAL_CAPSULE | Freq: Every day | ORAL | Status: DC
  Administered 2017-06-05 – 2017-06-08 (×4): 200 mg via ORAL
  Filled 2017-06-04 (×5): qty 2

## 2017-06-04 MED ORDER — SODIUM CHLORIDE 0.9% FLUSH
10.0000 mL | Freq: Two times a day (BID) | INTRAVENOUS | Status: DC
  Administered 2017-06-04 – 2017-06-06 (×3): 10 mL

## 2017-06-04 MED ORDER — ARTIFICIAL TEARS OPHTHALMIC OINT
TOPICAL_OINTMENT | OPHTHALMIC | Status: AC
Start: 2017-06-04 — End: ?
  Filled 2017-06-04: qty 3.5

## 2017-06-04 MED ORDER — ACETAMINOPHEN 160 MG/5ML PO SOLN: 650.0000 mg | Freq: Once | ORAL | Status: AC

## 2017-06-04 MED ORDER — SODIUM CHLORIDE 0.9 % IV SOLN: 250.0000 mL | INTRAVENOUS | Status: DC

## 2017-06-04 MED ORDER — MIDAZOLAM HCL 2 MG/2ML IJ SOLN: 2.0000 mg | INTRAMUSCULAR | Status: DC | PRN

## 2017-06-04 MED ORDER — SODIUM CHLORIDE 0.9 % IJ SOLN
INTRAMUSCULAR | Status: AC
  Filled 2017-06-04: qty 10

## 2017-06-04 MED ORDER — DOPAMINE-DEXTROSE 1.6-5 MG/ML-% IV SOLN
INTRAVENOUS | Status: DC | PRN
  Administered 2017-06-04: 3 ug/kg/min via INTRAVENOUS

## 2017-06-04 MED ORDER — SODIUM CHLORIDE 0.9% FLUSH: 10.0000 mL | INTRAVENOUS | Status: DC | PRN

## 2017-06-04 MED ORDER — FAMOTIDINE IN NACL 20-0.9 MG/50ML-% IV SOLN
20.0000 mg | Freq: Two times a day (BID) | INTRAVENOUS | Status: DC
  Administered 2017-06-04: 20 mg via INTRAVENOUS

## 2017-06-04 MED ORDER — NITROGLYCERIN IN D5W 200-5 MCG/ML-% IV SOLN: 0.0000 ug/min | INTRAVENOUS | Status: DC

## 2017-06-04 MED ORDER — SODIUM CHLORIDE 0.9 % IV SOLN
INTRAVENOUS | Status: DC | PRN
  Administered 2017-06-04: .7 ug/kg/h via INTRAVENOUS

## 2017-06-04 MED ORDER — SODIUM CHLORIDE 0.9 % IV SOLN
INTRAVENOUS | Status: DC
  Filled 2017-06-04: qty 1

## 2017-06-04 MED ORDER — NITROGLYCERIN 0.2 MG/ML ON CALL CATH LAB
INTRAVENOUS | Status: DC | PRN
  Administered 2017-06-04: 20 ug via INTRAVENOUS

## 2017-06-04 MED ORDER — PROPOFOL 10 MG/ML IV BOLUS
INTRAVENOUS | Status: DC | PRN
  Administered 2017-06-04: 120 mg via INTRAVENOUS
  Administered 2017-06-04: 20 mg via INTRAVENOUS
  Administered 2017-06-04 (×2): 30 mg via INTRAVENOUS

## 2017-06-04 MED ORDER — PROPOFOL 10 MG/ML IV BOLUS
INTRAVENOUS | Status: AC
  Filled 2017-06-04: qty 20

## 2017-06-04 MED ORDER — MORPHINE SULFATE (PF) 2 MG/ML IV SOLN
1.0000 mg | INTRAVENOUS | Status: AC | PRN
  Administered 2017-06-04 (×2): 2 mg via INTRAVENOUS
  Filled 2017-06-04: qty 1

## 2017-06-04 SURGICAL SUPPLY — 107 items
ADAPTER CARDIO PERF ANTE/RETRO (ADAPTER) ×4 IMPLANT
BAG DECANTER FOR FLEXI CONT (MISCELLANEOUS) ×4 IMPLANT
BANDAGE ACE 4X5 VEL STRL LF (GAUZE/BANDAGES/DRESSINGS) ×4 IMPLANT
BANDAGE ACE 6X5 VEL STRL LF (GAUZE/BANDAGES/DRESSINGS) ×4 IMPLANT
BASKET HEART  (ORDER IN 25'S) (MISCELLANEOUS) ×1
BASKET HEART (ORDER IN 25'S) (MISCELLANEOUS) ×1
BASKET HEART (ORDER IN 25S) (MISCELLANEOUS) ×2 IMPLANT
BLADE CLIPPER SURG (BLADE) ×4 IMPLANT
BLADE STERNUM SYSTEM 6 (BLADE) ×4 IMPLANT
BLADE SURG 11 STRL SS (BLADE) ×4 IMPLANT
BLADE SURG 12 STRL SS (BLADE) ×4 IMPLANT
BNDG GAUZE ELAST 4 BULKY (GAUZE/BANDAGES/DRESSINGS) ×4 IMPLANT
CANISTER SUCT 3000ML PPV (MISCELLANEOUS) ×4 IMPLANT
CANNULA GUNDRY RCSP 15FR (MISCELLANEOUS) ×4 IMPLANT
CATH CPB KIT VANTRIGT (MISCELLANEOUS) ×4 IMPLANT
CATH ROBINSON RED A/P 18FR (CATHETERS) ×12 IMPLANT
CATH THORACIC 36FR RT ANG (CATHETERS) ×4 IMPLANT
CLIP RETRACTION 3.0MM CORONARY (MISCELLANEOUS) ×4 IMPLANT
CLIP VESOCCLUDE SM WIDE 24/CT (CLIP) ×12 IMPLANT
CRADLE DONUT ADULT HEAD (MISCELLANEOUS) ×4 IMPLANT
DERMABOND ADVANCED (GAUZE/BANDAGES/DRESSINGS) ×2
DERMABOND ADVANCED .7 DNX12 (GAUZE/BANDAGES/DRESSINGS) ×2 IMPLANT
DRAIN CHANNEL 32F RND 10.7 FF (WOUND CARE) ×4 IMPLANT
DRAPE CARDIOVASCULAR INCISE (DRAPES) ×2
DRAPE SLUSH/WARMER DISC (DRAPES) ×4 IMPLANT
DRAPE SRG 135X102X78XABS (DRAPES) ×2 IMPLANT
DRSG AQUACEL AG ADV 3.5X14 (GAUZE/BANDAGES/DRESSINGS) ×4 IMPLANT
ELECT BLADE 4.0 EZ CLEAN MEGAD (MISCELLANEOUS) ×4
ELECT BLADE 6.5 EXT (BLADE) ×4 IMPLANT
ELECT CAUTERY BLADE 6.4 (BLADE) ×4 IMPLANT
ELECT REM PT RETURN 9FT ADLT (ELECTROSURGICAL) ×8
ELECTRODE BLDE 4.0 EZ CLN MEGD (MISCELLANEOUS) ×2 IMPLANT
ELECTRODE REM PT RTRN 9FT ADLT (ELECTROSURGICAL) ×4 IMPLANT
FELT TEFLON 1X6 (MISCELLANEOUS) ×4 IMPLANT
GAUZE SPONGE 4X4 12PLY STRL (GAUZE/BANDAGES/DRESSINGS) ×8 IMPLANT
GLOVE BIO SURGEON STRL SZ 6 (GLOVE) ×12 IMPLANT
GLOVE BIO SURGEON STRL SZ 6.5 (GLOVE) ×24 IMPLANT
GLOVE BIO SURGEON STRL SZ7.5 (GLOVE) ×12 IMPLANT
GLOVE BIO SURGEONS STRL SZ 6.5 (GLOVE) ×8
GLOVE BIOGEL PI IND STRL 6 (GLOVE) ×2 IMPLANT
GLOVE BIOGEL PI IND STRL 7.0 (GLOVE) ×8 IMPLANT
GLOVE BIOGEL PI INDICATOR 6 (GLOVE) ×2
GLOVE BIOGEL PI INDICATOR 7.0 (GLOVE) ×8
GOWN STRL REUS W/ TWL LRG LVL3 (GOWN DISPOSABLE) ×16 IMPLANT
GOWN STRL REUS W/TWL LRG LVL3 (GOWN DISPOSABLE) ×16
HEMOSTAT POWDER SURGIFOAM 1G (HEMOSTASIS) ×12 IMPLANT
HEMOSTAT SURGICEL 2X14 (HEMOSTASIS) ×4 IMPLANT
INSERT FOGARTY XLG (MISCELLANEOUS) IMPLANT
KIT BASIN OR (CUSTOM PROCEDURE TRAY) ×4 IMPLANT
KIT SUCTION CATH 14FR (SUCTIONS) ×4 IMPLANT
KIT TURNOVER KIT B (KITS) ×4 IMPLANT
KIT VASOVIEW HEMOPRO VH 3000 (KITS) ×4 IMPLANT
LEAD PACING MYOCARDI (MISCELLANEOUS) ×4 IMPLANT
MARKER GRAFT CORONARY BYPASS (MISCELLANEOUS) ×12 IMPLANT
NS IRRIG 1000ML POUR BTL (IV SOLUTION) ×20 IMPLANT
PACK E OPEN HEART (SUTURE) ×4 IMPLANT
PACK OPEN HEART (CUSTOM PROCEDURE TRAY) ×4 IMPLANT
PAD ARMBOARD 7.5X6 YLW CONV (MISCELLANEOUS) ×8 IMPLANT
PAD ELECT DEFIB RADIOL ZOLL (MISCELLANEOUS) ×4 IMPLANT
PENCIL BUTTON HOLSTER BLD 10FT (ELECTRODE) ×4 IMPLANT
POWDER SURGICEL 3.0 GRAM (HEMOSTASIS) ×4 IMPLANT
PUNCH AORTIC ROTATE  4.5MM 8IN (MISCELLANEOUS) ×4 IMPLANT
PUNCH AORTIC ROTATE 4.0MM (MISCELLANEOUS) IMPLANT
PUNCH AORTIC ROTATE 4.5MM 8IN (MISCELLANEOUS) IMPLANT
PUNCH AORTIC ROTATE 5MM 8IN (MISCELLANEOUS) IMPLANT
SET CARDIOPLEGIA MPS 5001102 (MISCELLANEOUS) ×4 IMPLANT
SPOGE SURGIFLO 8M (HEMOSTASIS) ×2
SPONGE LAP 18X18 X RAY DECT (DISPOSABLE) ×12 IMPLANT
SPONGE LAP 4X18 X RAY DECT (DISPOSABLE) ×4 IMPLANT
SPONGE SURGIFLO 8M (HEMOSTASIS) ×2 IMPLANT
STOPCOCK 4 WAY LG BORE MALE ST (IV SETS) ×4 IMPLANT
SURGIFLO W/THROMBIN 8M KIT (HEMOSTASIS) ×4 IMPLANT
SUT BONE WAX W31G (SUTURE) ×4 IMPLANT
SUT MNCRL AB 4-0 PS2 18 (SUTURE) ×4 IMPLANT
SUT PROLENE 3 0 SH DA (SUTURE) IMPLANT
SUT PROLENE 3 0 SH1 36 (SUTURE) IMPLANT
SUT PROLENE 4 0 RB 1 (SUTURE) ×2
SUT PROLENE 4 0 SH DA (SUTURE) ×4 IMPLANT
SUT PROLENE 4-0 RB1 .5 CRCL 36 (SUTURE) ×2 IMPLANT
SUT PROLENE 5 0 C 1 36 (SUTURE) IMPLANT
SUT PROLENE 6 0 C 1 30 (SUTURE) ×8 IMPLANT
SUT PROLENE 6 0 CC (SUTURE) ×12 IMPLANT
SUT PROLENE 8 0 BV175 6 (SUTURE) ×32 IMPLANT
SUT PROLENE BLUE 7 0 (SUTURE) ×4 IMPLANT
SUT SILK  1 MH (SUTURE)
SUT SILK 1 MH (SUTURE) IMPLANT
SUT SILK 2 0 SH CR/8 (SUTURE) ×4 IMPLANT
SUT SILK 3 0 SH CR/8 (SUTURE) IMPLANT
SUT STEEL 6MS V (SUTURE) ×8 IMPLANT
SUT STEEL SZ 6 DBL 3X14 BALL (SUTURE) ×4 IMPLANT
SUT VIC AB 1 CTX 36 (SUTURE) ×4
SUT VIC AB 1 CTX36XBRD ANBCTR (SUTURE) ×4 IMPLANT
SUT VIC AB 2-0 CT1 27 (SUTURE) ×2
SUT VIC AB 2-0 CT1 TAPERPNT 27 (SUTURE) ×2 IMPLANT
SUT VIC AB 2-0 CTX 27 (SUTURE) IMPLANT
SUT VIC AB 3-0 X1 27 (SUTURE) IMPLANT
SYSTEM SAHARA CHEST DRAIN ATS (WOUND CARE) ×4 IMPLANT
TAPE CLOTH SURG 4X10 WHT LF (GAUZE/BANDAGES/DRESSINGS) ×8 IMPLANT
TAPE PAPER 2X10 WHT MICROPORE (GAUZE/BANDAGES/DRESSINGS) ×4 IMPLANT
TOWEL GREEN STERILE (TOWEL DISPOSABLE) ×4 IMPLANT
TOWEL GREEN STERILE FF (TOWEL DISPOSABLE) ×4 IMPLANT
TRAY CATH LUMEN 1 20CM STRL (SET/KITS/TRAYS/PACK) ×4 IMPLANT
TRAY FOLEY SILVER 16FR TEMP (SET/KITS/TRAYS/PACK) ×4 IMPLANT
TUBING ART PRESS 48 MALE/FEM (TUBING) ×8 IMPLANT
TUBING INSUFFLATION (TUBING) ×4 IMPLANT
UNDERPAD 30X30 (UNDERPADS AND DIAPERS) ×4 IMPLANT
WATER STERILE IRR 1000ML POUR (IV SOLUTION) ×8 IMPLANT

## 2017-06-04 NOTE — Progress Notes (Signed)
Placed patient on CPAP via nasal mask, auto titrate settings (max 18.0, min 6.0) cm H20 with 4 LPM O2 bleed in. Tolerating well at this time. RN aware.

## 2017-06-04 NOTE — Brief Op Note (Signed)
06/04/2017  12:34 PM  PATIENT:  Charlena Cross Woodhams  62 y.o. male  PRE-OPERATIVE DIAGNOSIS:  CAD  POST-OPERATIVE DIAGNOSIS:  CAD  PROCEDURE:  TRANSESOPHAGEAL ECHOCARDIOGRAM (TEE), MEDIAN STERNOTOMY for  CORONARY ARTERY BYPASS GRAFTING (CABG) x 3 (LIMA to LAD, SVG to DIAGONAL, SVG to OM) WITH ENDOSCOPIC HARVESTING OF RIGHT GREATER SAPHENOUS VEIN and LEFT INTERNAL MAMMARY ARTERY HARVEST  SURGEON:  Surgeon(s) and Role:    Ivin Poot, MD - Primary  PHYSICIAN ASSISTANT: Lars Pinks PA-C  ASSISTANTS: Vernie Murders RNFA   ANESTHESIA:   general  DRAINS: Chest tubes placed in the mediastinal and pleural spaces   COUNTS CORRECT:  YES  DICTATION: Sales executive  PLAN OF CARE: Admit to inpatient   PATIENT DISPOSITION:  ICU - intubated and hemodynamically stable.   Delay start of Pharmacological VTE agent (>24hrs) due to surgical blood loss or risk of bleeding: yes  BASELINE WEIGHT: 74.4 kg

## 2017-06-04 NOTE — Anesthesia Procedure Notes (Signed)
Arterial Line Insertion Start/End4/03/2017 6:33 AM, 06/04/2017 6:36 AM Performed by: Orlie Dakin, CRNA, CRNA  Patient location: Pre-op. Preanesthetic checklist: patient identified, IV checked, site marked, risks and benefits discussed, surgical consent, monitors and equipment checked, pre-op evaluation and timeout performed Lidocaine 1% used for infiltration and patient sedated Right, radial was placed Catheter size: 20 G Hand hygiene performed  and maximum sterile barriers used   Attempts: 1 Procedure performed without using ultrasound guided technique. Following insertion, dressing applied and Biopatch. Post procedure assessment: normal  Patient tolerated the procedure well with no immediate complications. Additional procedure comments: Arterial line placement per Everitt Amber and supervision by [facility] CRNA.

## 2017-06-04 NOTE — Progress Notes (Signed)
      [address]       [place] [address]             [phone]    S/p CABG  Just extubated BP 95/74   Pulse 90   Temp 98.8 F (37.1 C)   Resp 12   Ht 5\' 7"  (1.702 m)   Wt 164 lb 0.4 oz (74.4 kg)   SpO2 100%   BMI 25.69 kg/m   CI= 2.7  Intake/Output Summary (Last 24 hours) at 06/04/2017 1827 Last data filed at 06/04/2017 1800 Gross per 24 hour  Intake 4804.53 ml  Output 4660 ml  Net 144.53 ml   CBG 83 ABG preextubation 7.304/47/128 Minimal CT output Doing well early postop  Remo Lipps C. Roxan Hockey, MD [facility] [phone]

## 2017-06-04 NOTE — Progress Notes (Signed)
Rapid Wean Protocol initiated 1722

## 2017-06-04 NOTE — Anesthesia Procedure Notes (Signed)
Procedure Name: Intubation Date/Time: 06/04/2017 7:47 AM Performed by: Orlie Dakin, CRNA Pre-anesthesia Checklist: Patient identified, Emergency Drugs available, Suction available, Patient being monitored and Timeout performed Patient Re-evaluated:Patient Re-evaluated prior to induction Oxygen Delivery Method: Circle system utilized Preoxygenation: Pre-oxygenation with 100% oxygen Induction Type: IV induction Ventilation: Mask ventilation without difficulty Laryngoscope Size: Mac and 3 Grade View: Grade I Tube type: Oral Tube size: 8.0 mm Number of attempts: 1 Airway Equipment and Method: Stylet Placement Confirmation: ETT inserted through vocal cords under direct vision,  positive ETCO2 and breath sounds checked- equal and bilateral Secured at: 22 cm Tube secured with: Tape Dental Injury: Teeth and Oropharynx as per pre-operative assessment

## 2017-06-04 NOTE — Anesthesia Procedure Notes (Signed)
Central Venous Catheter Insertion Performed by: Roberts Gaudy, MD, anesthesiologist Start/End4/03/2017 6:30 AM, 06/04/2017 6:40 AM Patient location: Pre-op. Preanesthetic checklist: patient identified, IV checked, site marked, risks and benefits discussed, surgical consent, monitors and equipment checked, pre-op evaluation, timeout performed and anesthesia consent Hand hygiene performed  and maximum sterile barriers used  PA cath was placed.Swan type:thermodilution Procedure performed without using ultrasound guided technique. Ultrasound Notes:anatomy identified, needle tip was noted to be adjacent to the nerve/plexus identified and no ultrasound evidence of intravascular and/or intraneural injection Attempts: 1 Following insertion, line sutured, dressing applied and Biopatch. Post procedure assessment: blood return through all ports, free fluid flow and no air  Patient tolerated the procedure well with no immediate complications.

## 2017-06-04 NOTE — Progress Notes (Signed)
Pre Procedure note for inpatients:   Elijah Knapp has been scheduled for Procedure(s): CORONARY ARTERY BYPASS GRAFTING (CABG) (N/A) TRANSESOPHAGEAL ECHOCARDIOGRAM (TEE) (N/A) today. The various methods of treatment have been discussed with the patient. After consideration of the risks, benefits and treatment options the patient has consented to the planned procedure.   The patient has been seen and labs reviewed. There are no changes in the patient's condition to prevent proceeding with the planned procedure today.  Recent labs:  Lab Results  Component Value Date   WBC 5.2 05/31/2017   HGB 14.9 05/31/2017   HCT 42.4 05/31/2017   PLT 236 05/31/2017   GLUCOSE 114 (H) 05/31/2017   ALT 22 05/31/2017   AST 26 05/31/2017   NA 135 05/31/2017   K 4.3 05/31/2017   CL 101 05/31/2017   CREATININE 0.92 05/31/2017   BUN 13 05/31/2017   CO2 22 05/31/2017   TSH 2.940 05/24/2017   INR 0.82 05/31/2017   HGBA1C 5.4 05/31/2017    Ivin Poot III, MD 06/04/2017 7:04 AM

## 2017-06-04 NOTE — Procedures (Signed)
Extubation Procedure Note  Patient Details:   Name: RAYNALDO FALCO DOB: 09-21-1955 MRN: [id number]   Airway Documentation:   Pt extubated per protocol. Pt had positive cuff leak prior to extubation. Patient placed on 4lpm humidified oxygen. Pt had strong cough and able to voice. Incentive spirometer instructed with teach back. Achieved 600 cc.   Evaluation  O2 sats: stable throughout Complications: No apparent complications Patient did tolerate procedure well. Bilateral Breath Sounds: Clear   Yes  Ander Purpura 06/04/2017, 6:17 PM

## 2017-06-04 NOTE — Transfer of Care (Signed)
Immediate Anesthesia Transfer of Care Note  Patient: Elijah Knapp  Procedure(s) Performed: CORONARY ARTERY BYPASS GRAFTING (CABG) x 3 WITH ENDOSCOPIC HARVESTING OF RIGHT SAPHENOUS VEIN (N/A Chest) TRANSESOPHAGEAL ECHOCARDIOGRAM (TEE) (N/A )  Patient Location: SICU  Anesthesia Type:General  Level of Consciousness: sedated and Patient remains intubated per anesthesia plan  Airway & Oxygen Therapy: Patient remains intubated per anesthesia plan and Patient placed on Ventilator (see vital sign flow sheet for setting)  Post-op Assessment: Report given to RN and Post -op Vital signs reviewed and stable  Post vital signs: Reviewed and stable  Last Vitals:  Vitals Value Taken Time  BP 132/69 06/04/2017  2:56 PM  Temp 35.7 C 06/04/2017  3:01 PM  Pulse 90 06/04/2017  3:01 PM  Resp 12 06/04/2017  3:01 PM  SpO2 100 % 06/04/2017  3:01 PM  Vitals shown include unvalidated device data.  Last Pain:  Vitals:   06/04/17 0551  TempSrc: Oral  PainSc:          Complications: No apparent anesthesia complications

## 2017-06-04 NOTE — Anesthesia Procedure Notes (Signed)
Central Venous Catheter Insertion Performed by: Roberts Gaudy, MD, anesthesiologist Start/End4/03/2017 6:30 AM, 06/04/2017 6:40 AM Patient location: Pre-op. Preanesthetic checklist: patient identified, IV checked, site marked, risks and benefits discussed, surgical consent, monitors and equipment checked, pre-op evaluation, timeout performed and anesthesia consent Lidocaine 1% used for infiltration and patient sedated Hand hygiene performed  and maximum sterile barriers used  Catheter size: 9 Fr Sheath introducer Procedure performed using ultrasound guided technique. Ultrasound Notes:anatomy identified, needle tip was noted to be adjacent to the nerve/plexus identified, no ultrasound evidence of intravascular and/or intraneural injection and image(s) printed for medical record Attempts: 1 Following insertion, line sutured and dressing applied. Post procedure assessment: blood return through all ports, free fluid flow and no air  Patient tolerated the procedure well with no immediate complications.

## 2017-06-04 NOTE — Progress Notes (Signed)
  Echocardiogram Echocardiogram Transesophageal has been performed.  Jannett Celestine 06/04/2017, 8:59 AM

## 2017-06-05 ENCOUNTER — Encounter (HOSPITAL_COMMUNITY): Payer: Self-pay | Admitting: Cardiothoracic Surgery

## 2017-06-05 ENCOUNTER — Inpatient Hospital Stay (HOSPITAL_COMMUNITY): Payer: 59

## 2017-06-05 LAB — POCT I-STAT, CHEM 8
BUN: 14 mg/dL (ref 6–20)
BUN: 12 mg/dL (ref 6–20)
BUN: 11 mg/dL (ref 6–20)
BUN: 11 mg/dL (ref 6–20)
BUN: 11 mg/dL (ref 6–20)
BUN: 11 mg/dL (ref 6–20)
BUN: 12 mg/dL (ref 6–20)
Calcium, Ion: 1.23 mmol/L (ref 1.15–1.40)
Calcium, Ion: 1.22 mmol/L (ref 1.15–1.40)
Calcium, Ion: 0.96 mmol/L — ABNORMAL LOW (ref 1.15–1.40)
Calcium, Ion: 1.07 mmol/L — ABNORMAL LOW (ref 1.15–1.40)
Calcium, Ion: 0.97 mmol/L — ABNORMAL LOW (ref 1.15–1.40)
Calcium, Ion: 1.07 mmol/L — ABNORMAL LOW (ref 1.15–1.40)
Calcium, Ion: 1.19 mmol/L (ref 1.15–1.40)
Chloride: 101 mmol/L (ref 101–111)
Chloride: 102 mmol/L (ref 101–111)
Chloride: 99 mmol/L — ABNORMAL LOW (ref 101–111)
Chloride: 99 mmol/L — ABNORMAL LOW (ref 101–111)
Chloride: 99 mmol/L — ABNORMAL LOW (ref 101–111)
Chloride: 100 mmol/L — ABNORMAL LOW (ref 101–111)
Chloride: 99 mmol/L — ABNORMAL LOW (ref 101–111)
Creatinine, Ser: 0.8 mg/dL (ref 0.61–1.24)
Creatinine, Ser: 0.8 mg/dL (ref 0.61–1.24)
Creatinine, Ser: 0.5 mg/dL — ABNORMAL LOW (ref 0.61–1.24)
Creatinine, Ser: 0.7 mg/dL (ref 0.61–1.24)
Creatinine, Ser: 0.6 mg/dL — ABNORMAL LOW (ref 0.61–1.24)
Creatinine, Ser: 0.6 mg/dL — ABNORMAL LOW (ref 0.61–1.24)
Creatinine, Ser: 0.9 mg/dL (ref 0.61–1.24)
Glucose, Bld: 113 mg/dL — ABNORMAL HIGH (ref 65–99)
Glucose, Bld: 105 mg/dL — ABNORMAL HIGH (ref 65–99)
Glucose, Bld: 98 mg/dL (ref 65–99)
Glucose, Bld: 148 mg/dL — ABNORMAL HIGH (ref 65–99)
Glucose, Bld: 145 mg/dL — ABNORMAL HIGH (ref 65–99)
Glucose, Bld: 125 mg/dL — ABNORMAL HIGH (ref 65–99)
Glucose, Bld: 156 mg/dL — ABNORMAL HIGH (ref 65–99)
HCT: 37 % — ABNORMAL LOW (ref 39.0–52.0)
HCT: 36 % — ABNORMAL LOW (ref 39.0–52.0)
HCT: 27 % — ABNORMAL LOW (ref 39.0–52.0)
HCT: 27 % — ABNORMAL LOW (ref 39.0–52.0)
HCT: 26 % — ABNORMAL LOW (ref 39.0–52.0)
HCT: 27 % — ABNORMAL LOW (ref 39.0–52.0)
HCT: 32 % — ABNORMAL LOW (ref 39.0–52.0)
Hemoglobin: 12.6 g/dL — ABNORMAL LOW (ref 13.0–17.0)
Hemoglobin: 12.2 g/dL — ABNORMAL LOW (ref 13.0–17.0)
Hemoglobin: 9.2 g/dL — ABNORMAL LOW (ref 13.0–17.0)
Hemoglobin: 9.2 g/dL — ABNORMAL LOW (ref 13.0–17.0)
Hemoglobin: 8.8 g/dL — ABNORMAL LOW (ref 13.0–17.0)
Hemoglobin: 9.2 g/dL — ABNORMAL LOW (ref 13.0–17.0)
Hemoglobin: 10.9 g/dL — ABNORMAL LOW (ref 13.0–17.0)
Potassium: 4.2 mmol/L (ref 3.5–5.1)
Potassium: 4.5 mmol/L (ref 3.5–5.1)
Potassium: 4.7 mmol/L (ref 3.5–5.1)
Potassium: 5.3 mmol/L — ABNORMAL HIGH (ref 3.5–5.1)
Potassium: 5.7 mmol/L — ABNORMAL HIGH (ref 3.5–5.1)
Potassium: 4.4 mmol/L (ref 3.5–5.1)
Potassium: 4.1 mmol/L (ref 3.5–5.1)
Sodium: 139 mmol/L (ref 135–145)
Sodium: 138 mmol/L (ref 135–145)
Sodium: 137 mmol/L (ref 135–145)
Sodium: 134 mmol/L — ABNORMAL LOW (ref 135–145)
Sodium: 137 mmol/L (ref 135–145)
Sodium: 139 mmol/L (ref 135–145)
Sodium: 136 mmol/L (ref 135–145)
TCO2: 31 mmol/L (ref 22–32)
TCO2: 28 mmol/L (ref 22–32)
TCO2: 28 mmol/L (ref 22–32)
TCO2: 28 mmol/L (ref 22–32)
TCO2: 34 mmol/L — ABNORMAL HIGH (ref 22–32)
TCO2: 30 mmol/L (ref 22–32)
TCO2: 27 mmol/L (ref 22–32)

## 2017-06-05 LAB — POCT I-STAT 3, ART BLOOD GAS (G3+)
Acid-Base Excess: 2 mmol/L (ref 0.0–2.0)
Acid-Base Excess: 1 mmol/L (ref 0.0–2.0)
Bicarbonate: 27.3 mmol/L (ref 20.0–28.0)
Bicarbonate: 26.1 mmol/L (ref 20.0–28.0)
Bicarbonate: 26.9 mmol/L (ref 20.0–28.0)
O2 Saturation: 100 %
O2 Saturation: 100 %
O2 Saturation: 100 %
TCO2: 29 mmol/L (ref 22–32)
TCO2: 27 mmol/L (ref 22–32)
TCO2: 28 mmol/L (ref 22–32)
pCO2 arterial: 52.3 mmHg — ABNORMAL HIGH (ref 32.0–48.0)
pCO2 arterial: 37.5 mmHg (ref 32.0–48.0)
pCO2 arterial: 45.3 mmHg (ref 32.0–48.0)
pH, Arterial: 7.325 — ABNORMAL LOW (ref 7.350–7.450)
pH, Arterial: 7.451 — ABNORMAL HIGH (ref 7.350–7.450)
pH, Arterial: 7.382 (ref 7.350–7.450)
pO2, Arterial: 467 mmHg — ABNORMAL HIGH (ref 83.0–108.0)
pO2, Arterial: 501 mmHg — ABNORMAL HIGH (ref 83.0–108.0)
pO2, Arterial: 425 mmHg — ABNORMAL HIGH (ref 83.0–108.0)

## 2017-06-05 LAB — BASIC METABOLIC PANEL
Anion gap: 9 (ref 5–15)
BUN: 9 mg/dL (ref 6–20)
CO2: 25 mmol/L (ref 22–32)
CREATININE: 0.86 mg/dL (ref 0.61–1.24)
Calcium: 7.7 mg/dL — ABNORMAL LOW (ref 8.9–10.3)
Chloride: 103 mmol/L (ref 101–111)
GFR calc Af Amer: >60 mL/min (ref >60)
GFR calc non Af Amer: >60 mL/min (ref >60)
GLUCOSE: 120 mg/dL — AB (ref 65–99)
Potassium: 4 mmol/L (ref 3.5–5.1)
Sodium: 137 mmol/L (ref 135–145)

## 2017-06-05 LAB — CBC
HEMATOCRIT: 32.1 % — AB (ref 39.0–52.0)
HEMATOCRIT: 32.9 % — AB (ref 39.0–52.0)
Hemoglobin: 10.6 g/dL — ABNORMAL LOW (ref 13.0–17.0)
Hemoglobin: 10.6 g/dL — ABNORMAL LOW (ref 13.0–17.0)
MCH: 31.1 pg (ref 26.0–34.0)
MCH: 31.1 pg (ref 26.0–34.0)
MCHC: 33 g/dL (ref 30.0–36.0)
MCHC: 32.2 g/dL (ref 30.0–36.0)
MCV: 94.1 fL (ref 78.0–100.0)
MCV: 96.5 fL (ref 78.0–100.0)
PLATELETS: 156 10*3/uL (ref 150–400)
Platelets: 166 10*3/uL (ref 150–400)
RBC: 3.41 MIL/uL — ABNORMAL LOW (ref 4.22–5.81)
RBC: 3.41 MIL/uL — ABNORMAL LOW (ref 4.22–5.81)
RDW: 13 % (ref 11.5–15.5)
RDW: 13.3 % (ref 11.5–15.5)
WBC: 8 10*3/uL (ref 4.0–10.5)
WBC: 9.2 10*3/uL (ref 4.0–10.5)

## 2017-06-05 LAB — GLUCOSE, CAPILLARY
Glucose-Capillary: 104 mg/dL — ABNORMAL HIGH (ref 65–99)
Glucose-Capillary: 127 mg/dL — ABNORMAL HIGH (ref 65–99)
Glucose-Capillary: 129 mg/dL — ABNORMAL HIGH (ref 65–99)
Glucose-Capillary: 134 mg/dL — ABNORMAL HIGH (ref 65–99)
Glucose-Capillary: 155 mg/dL — ABNORMAL HIGH (ref 65–99)
Glucose-Capillary: 87 mg/dL (ref 65–99)

## 2017-06-05 LAB — CREATININE, SERUM
Creatinine, Ser: 1.03 mg/dL (ref 0.61–1.24)
GFR calc Af Amer: >60 mL/min (ref >60)
GFR calc non Af Amer: >60 mL/min (ref >60)

## 2017-06-05 LAB — MAGNESIUM
Magnesium: 2.6 mg/dL — ABNORMAL HIGH (ref 1.7–2.4)
Magnesium: 2.7 mg/dL — ABNORMAL HIGH (ref 1.7–2.4)

## 2017-06-05 MED ORDER — ORAL CARE MOUTH RINSE: 15.0000 mL | Freq: Two times a day (BID) | OROMUCOSAL | Status: DC

## 2017-06-05 MED ORDER — ALPRAZOLAM 0.5 MG PO TABS
0.5000 mg | ORAL_TABLET | Freq: Three times a day (TID) | ORAL | Status: DC | PRN
  Administered 2017-06-06 – 2017-06-10 (×6): 0.5 mg via ORAL
  Filled 2017-06-05 (×6): qty 1

## 2017-06-05 MED ORDER — INSULIN ASPART 100 UNIT/ML ~~LOC~~ SOLN
0.0000 [IU] | SUBCUTANEOUS | Status: DC
  Administered 2017-06-05 (×2): 2 [IU] via SUBCUTANEOUS

## 2017-06-05 MED ORDER — POTASSIUM CHLORIDE CRYS ER 20 MEQ PO TBCR
20.0000 meq | EXTENDED_RELEASE_TABLET | Freq: Once | ORAL | Status: AC
  Administered 2017-06-05: 20 meq via ORAL
  Filled 2017-06-05: qty 1

## 2017-06-05 MED ORDER — FUROSEMIDE 10 MG/ML IJ SOLN
20.0000 mg | Freq: Two times a day (BID) | INTRAMUSCULAR | Status: DC
  Administered 2017-06-05 – 2017-06-07 (×6): 20 mg via INTRAVENOUS
  Filled 2017-06-05 (×6): qty 2

## 2017-06-05 MED FILL — Magnesium Sulfate Inj 50%: INTRAMUSCULAR | Qty: 10 | Status: AC

## 2017-06-05 MED FILL — Potassium Chloride Inj 2 mEq/ML: INTRAVENOUS | Qty: 40 | Status: AC

## 2017-06-05 MED FILL — Heparin Sodium (Porcine) Inj 1000 Unit/ML: INTRAMUSCULAR | Qty: 30 | Status: AC

## 2017-06-05 MED FILL — Dexmedetomidine HCl in NaCl 0.9% IV Soln 400 MCG/100ML: INTRAVENOUS | Qty: 100 | Status: AC

## 2017-06-05 MED FILL — Thrombin For Soln 5000 Unit: CUTANEOUS | Qty: 5000 | Status: AC

## 2017-06-05 NOTE — Progress Notes (Signed)
  Patient refused CPAP at this time. States "it dried his mouth out last night" per RN.

## 2017-06-05 NOTE — Progress Notes (Signed)
Patient ID: Elijah Knapp, male   DOB: 1955/11/04, 62 y.o.   MRN: [id number] EVENING ROUNDS NOTE :     [address]       [place] [address]             [phone]                 1 Day Post-Op Procedure(s) (LRB): CORONARY ARTERY BYPASS GRAFTING (CABG) x 3 WITH ENDOSCOPIC HARVESTING OF RIGHT SAPHENOUS VEIN (N/A) TRANSESOPHAGEAL ECHOCARDIOGRAM (TEE) (N/A)  Total Length of Stay:  LOS: 1 day  BP 122/71   Pulse 98   Temp 98.1 F (36.7 C) (Oral)   Resp 10   Ht 5\' 7"  (1.702 m)   Wt 182 lb 8.7 oz (82.8 kg)   SpO2 97%   BMI 28.59 kg/m   .Intake/Output      04/01 0701 - 04/02 0700 04/02 0701 - 04/03 0700   P.O.  780   I.V. (mL/kg) 4526.2 (54.7) 610.4 (7.4)   Blood 300    IV Piggyback 1450 100   Total Intake(mL/kg) 6276.2 (75.8) 1490.4 (18)   Urine (mL/kg/hr) 5340 (2.7) 765 (0.8)   Blood 600    Chest Tube 360 200   Total Output 6300 965   Net -23.8 +525.4          . sodium chloride 20 mL/hr at 06/05/17 1800  . sodium chloride    . sodium chloride Stopped (06/05/17 0911)  .  ceFAZolin (ANCEF) IV Stopped (06/05/17 1236)  . dexmedetomidine (PRECEDEX) IV infusion Stopped (06/05/17 0500)  . DOPamine 2.007 mcg/kg/min (06/05/17 1800)  . lactated ringers    . lactated ringers Stopped (06/04/17 1421)  . lactated ringers 20 mL/hr at 06/05/17 1800  . nitroGLYCERIN 0 mcg/min (06/04/17 1421)  . phenylephrine (NEO-SYNEPHRINE) Adult infusion Stopped (06/05/17 1746)     Lab Results  Component Value Date   WBC 9.2 06/05/2017   HGB 10.9 (L) 06/05/2017   HCT 32.0 (L) 06/05/2017   PLT 156 06/05/2017   GLUCOSE 156 (H) 06/05/2017   ALT 22 05/31/2017   AST 26 05/31/2017   NA 136 06/05/2017   K 4.1 06/05/2017   CL 99 (L) 06/05/2017   CREATININE 0.90 06/05/2017   BUN 12 06/05/2017   CO2 25 06/05/2017   TSH 2.940 05/24/2017   INR 1.17 06/04/2017   HGBA1C 5.4 05/31/2017   200 from chest tubes today, still in Lasix given tonight 20 mg Neo off    Grace Isaac MD  Beeper [phone] Office [phone] 06/05/2017 6:37 PM

## 2017-06-05 NOTE — Anesthesia Postprocedure Evaluation (Signed)
Anesthesia Post Note  Patient: Elijah Knapp  Procedure(s) Performed: CORONARY ARTERY BYPASS GRAFTING (CABG) x 3 WITH ENDOSCOPIC HARVESTING OF RIGHT SAPHENOUS VEIN (N/A Chest) TRANSESOPHAGEAL ECHOCARDIOGRAM (TEE) (N/A )     Patient location during evaluation: SICU Anesthesia Type: General Level of consciousness: awake Pain management: pain level controlled Vital Signs Assessment: post-procedure vital signs reviewed and stable Respiratory status: spontaneous breathing Cardiovascular status: stable Postop Assessment: no apparent nausea or vomiting Anesthetic complications: no    Last Vitals:  Vitals:   06/05/17 1152 06/05/17 1200  BP: 95/71 95/71  Pulse:  96  Resp:  19  Temp: 36.6 C   SpO2:  96%    Last Pain:  Vitals:   06/05/17 1152  TempSrc: Oral  PainSc:                  Effie Berkshire

## 2017-06-05 NOTE — Plan of Care (Signed)
  Problem: Activity: Goal: Risk for activity intolerance will decrease Outcome: Progressing   Problem: Cardiac: Goal: Hemodynamic stability will improve Outcome: Progressing   Problem: Respiratory: Goal: Respiratory status will improve Outcome: Progressing   Problem: Skin Integrity: Goal: Wound healing without signs and symptoms of infection Outcome: Progressing Goal: Risk for impaired skin integrity will decrease Outcome: Progressing   Problem: Urinary Elimination: Goal: Ability to achieve and maintain adequate renal perfusion and functioning will improve Outcome: Progressing

## 2017-06-05 NOTE — Progress Notes (Addendum)
T[facility] DAILY ICU PROGRESS NOTE                   [address]            [place] [address]          [phone]   1 Day Post-Op Procedure(s) (LRB): CORONARY ARTERY BYPASS GRAFTING (CABG) x 3 WITH ENDOSCOPIC HARVESTING OF RIGHT SAPHENOUS VEIN (N/A) TRANSESOPHAGEAL ECHOCARDIOGRAM (TEE) (N/A)  Total Length of Stay:  LOS: 1 day   Subjective: Patient sleepy this am but arousable.  Objective: Vital signs in last 24 hours: Temp:  [96.1 F (35.6 C)-100.4 F (38 C)] 99.1 F (37.3 C) (04/02 0815) Pulse Rate:  [63-106] 88 (04/02 0815) Cardiac Rhythm: Normal sinus rhythm (04/02 0812) Resp:  [6-30] 10 (04/02 0815) BP: (75-170)/(55-111) 94/55 (04/02 0800) SpO2:  [91 %-100 %] 98 % (04/02 0815) Arterial Line BP: (15-180)/(8-104) 102/46 (04/02 0815) FiO2 (%):  [36 %-50 %] 36 % (04/01 1820) Weight:  [164 lb 0.4 oz (74.4 kg)-182 lb 8.7 oz (82.8 kg)] 182 lb 8.7 oz (82.8 kg) (04/02 0615)  Filed Weights   06/04/17 1604 06/05/17 0615  Weight: 164 lb 0.4 oz (74.4 kg) 182 lb 8.7 oz (82.8 kg)    Weight change:    Hemodynamic parameters for last 24 hours: PAP: (11-37)/(4-25) 24/15 CO:  [3.9 L/min-5.7 L/min] 5.7 L/min CI:  [2.1 L/min/m2-3.1 L/min/m2] 3.1 L/min/m2  Intake/Output from previous day: 04/01 0701 - 04/02 0700 In: 6276.2 [I.V.:4526.2; Blood:300; IV Piggyback:1450] Out: 6300 [Urine:5340; Blood:600; Chest Tube:360]  Intake/Output this shift: Total I/O In: 68.9 [I.V.:68.9] Out: -   Current Meds: Scheduled Meds: . acetaminophen  1,000 mg Oral Q6H   Or  . acetaminophen (TYLENOL) oral liquid 160 mg/5 mL  1,000 mg Per Tube Q6H  . aspirin EC  325 mg Oral Daily   Or  . aspirin  324 mg Per Tube Daily  . bisacodyl  10 mg Oral Daily   Or  . bisacodyl  10 mg Rectal Daily  . Chlorhexidine Gluconate Cloth  6 each Topical Daily  . docusate sodium  200 mg Oral Daily  . insulin aspart  0-24 Units Subcutaneous Q4H  . ketorolac  15 mg Intravenous Q6H  . mouth rinse  15  mL Mouth Rinse BID  . metoCLOPramide (REGLAN) injection  10 mg Intravenous Q6H  . metoprolol tartrate  12.5 mg Oral BID   Or  . metoprolol tartrate  12.5 mg Per Tube BID  . pantoprazole  40 mg Oral Daily  . sodium chloride flush  10-40 mL Intracatheter Q12H  . sodium chloride flush  3 mL Intravenous Q12H   Continuous Infusions: . sodium chloride 20 mL/hr at 06/05/17 0800  . sodium chloride    . sodium chloride 20 mL/hr at 06/05/17 0800  .  ceFAZolin (ANCEF) IV Stopped (06/05/17 2130)  . dexmedetomidine (PRECEDEX) IV infusion Stopped (06/05/17 0500)  . DOPamine 2.007 mcg/kg/min (06/05/17 0800)  . lactated ringers    . lactated ringers Stopped (06/04/17 1421)  . lactated ringers 20 mL/hr at 06/05/17 0800  . nitroGLYCERIN 0 mcg/min (06/04/17 1421)  . phenylephrine (NEO-SYNEPHRINE) Adult infusion 25.067 mcg/min (06/05/17 0800)   PRN Meds:.sodium chloride, lactated ringers, metoprolol tartrate, morphine injection, ondansetron (ZOFRAN) IV, oxyCODONE, sodium chloride flush, sodium chloride flush, traMADol  General appearance: cooperative and no distress Neurologic: intact Heart: RRR Lungs: Slightly diminished at bases Abdomen: Soft, non tender, sporadic bowel sounds Extremities: [place] LE edema Wound: Aquacel intact on sternum.  RLE dressings are clean and dry  Lab Results: CBC: Recent Labs    06/04/17 2105 06/04/17 2107 06/05/17 0334  WBC 7.7  --  8.0  HGB 11.3* 11.2* 10.6*  HCT 32.6* 33.0* 32.1*  PLT 158  --  166   BMET:  Recent Labs    06/04/17 2107 06/05/17 0334  NA 138 137  K 4.4 4.0  CL 101 103  CO2  --  25  GLUCOSE 172* 120*  BUN 9 9  CREATININE 0.80 0.86  CALCIUM  --  7.7*    CMET: Lab Results  Component Value Date   WBC 8.0 06/05/2017   HGB 10.6 (L) 06/05/2017   HCT 32.1 (L) 06/05/2017   PLT 166 06/05/2017   GLUCOSE 120 (H) 06/05/2017   ALT 22 05/31/2017   AST 26 05/31/2017   NA 137 06/05/2017   K 4.0 06/05/2017   CL 103 06/05/2017   CREATININE  0.86 06/05/2017   BUN 9 06/05/2017   CO2 25 06/05/2017   TSH 2.940 05/24/2017   INR 1.17 06/04/2017   HGBA1C 5.4 05/31/2017      PT/INR:  Recent Labs    06/04/17 1451  LABPROT 14.8  INR 1.17   Radiology: Dg Chest Port 1 View  Result Date: 06/05/2017 CLINICAL DATA:  Status post CABG.  History of asthma-COPD. EXAM: PORTABLE CHEST 1 VIEW COMPARISON:  Chest x-ray of June 04, 2017 FINDINGS: The lungs are adequately inflated. The trachea and esophagus have been extubated. The mediastinal drain and left chest tube are in stable position. No definite pneumothorax or significant pleural effusion. Persistent subcutaneous emphysema persists over the left mid chest. The heart and pulmonary vascularity are normal. The Swan-Ganz catheter tip projects in the main pulmonary outflow tract. The sternal wires are intact. IMPRESSION: Good aeration of both lungs following extubation. No definite pneumothorax. No pneumonia or atelectasis is observed. No pulmonary edema. The remaining support tubes are in reasonable position. Electronically Signed   By: David  Martinique M.D.   On: 06/05/2017 07:56   Dg Chest Port 1 View  Result Date: 06/04/2017 CLINICAL DATA:  Postoperative day 0 status post CABG EXAM: PORTABLE CHEST 1 VIEW COMPARISON:  05/31/2017 FINDINGS: Endotracheal tube tip 5.8 cm above the carina and just below the thoracic inlet. Swan-Ganz catheter tip in the main pulmonary artery. Mediastinal drain and left-sided chest tubes in place. Epicardial pacer leads noted. Nasogastric tube enters the stomach. There is a small amount of subcutaneous emphysema tracking along the left chest, probably along the pectoralis region. No pneumomediastinum is appreciated. No visible pneumothorax. Normal alignment of the median sternotomy wires. Mildly low lung volumes. Very minimal blunting along the left lateral costophrenic angle. Heart size remains within normal limits. IMPRESSION: 1. The tubes and lines appear satisfactorily  position. No complicating feature identified. 2. Minimal subcutaneous emphysema along the left chest. 3. Minimal atelectasis along the left lateral costophrenic angle. Electronically Signed   By: Van Clines M.D.   On: 06/04/2017 15:45     Assessment/Plan: S/P Procedure(s) (LRB): CORONARY ARTERY BYPASS GRAFTING (CABG) x 3 WITH ENDOSCOPIC HARVESTING OF RIGHT SAPHENOUS VEIN (N/A) TRANSESOPHAGEAL ECHOCARDIOGRAM (TEE) (N/A)  1. CV-CO/CI 5.7/3.1.Dr. Prescott Gum reviewed EKG which showed diffuse ST elevations in multiple leads?pericarditis. Will check EKG in am. Wean Dopamine drip. Also, on Lopressor 12.5 mg bid  2. Pulmonary-Chest tubes with 360 output last 24 hours. Will dangle and see how much output to determine if able to remove one tube or not. On 4 liters of  oxygen via [place]. Will wean to room air over next few days. CXR this am is stable (no pneumothorax). Encourage incentive spirometer. 3. Volume overload-will give Lasix 20 mg IV bid 4. ABL anemia-H and H 10.6 and 32.1 5. CBGs 134/127/104. Pre op HGA1C 5.4. Will transition off Insulin drip. 6. Please see progression orders    Donielle Liston Alba PA-C 06/05/2017 8:21 AM  patient examined and medical record reviewed,agree with above note. [name] 06/05/2017

## 2017-06-05 NOTE — Progress Notes (Signed)
EKG CRITICAL VALUE     12 lead EKG performed.  Critical value noted.  Ebony Hail, RN notified.   Genia Plants, CCT 06/05/2017 7:43 AM

## 2017-06-06 ENCOUNTER — Inpatient Hospital Stay (HOSPITAL_COMMUNITY): Payer: 59

## 2017-06-06 LAB — CBC
HEMATOCRIT: 29.7 % — AB (ref 39.0–52.0)
Hemoglobin: 10 g/dL — ABNORMAL LOW (ref 13.0–17.0)
MCH: 32.8 pg (ref 26.0–34.0)
MCHC: 33.7 g/dL (ref 30.0–36.0)
MCV: 97.4 fL (ref 78.0–100.0)
Platelets: 137 10*3/uL — ABNORMAL LOW (ref 150–400)
RBC: 3.05 MIL/uL — ABNORMAL LOW (ref 4.22–5.81)
RDW: 13.5 % (ref 11.5–15.5)
WBC: 7.4 10*3/uL (ref 4.0–10.5)

## 2017-06-06 LAB — BASIC METABOLIC PANEL
Anion gap: 8 (ref 5–15)
BUN: 13 mg/dL (ref 6–20)
CALCIUM: 8.4 mg/dL — AB (ref 8.9–10.3)
CO2: 27 mmol/L (ref 22–32)
Chloride: 100 mmol/L — ABNORMAL LOW (ref 101–111)
Creatinine, Ser: 1.01 mg/dL (ref 0.61–1.24)
GFR calc Af Amer: >60 mL/min (ref >60)
GLUCOSE: 116 mg/dL — AB (ref 65–99)
POTASSIUM: 4.4 mmol/L (ref 3.5–5.1)
SODIUM: 135 mmol/L (ref 135–145)

## 2017-06-06 LAB — CK TOTAL AND CKMB (NOT AT ARMC)
CK TOTAL: 455 U/L — AB (ref 49–397)
CK, MB: 9.4 ng/mL — ABNORMAL HIGH (ref 0.5–5.0)
Relative Index: 2.1 (ref 0.0–2.5)

## 2017-06-06 LAB — GLUCOSE, CAPILLARY
Glucose-Capillary: 111 mg/dL — ABNORMAL HIGH (ref 65–99)
Glucose-Capillary: 117 mg/dL — ABNORMAL HIGH (ref 65–99)
Glucose-Capillary: 124 mg/dL — ABNORMAL HIGH (ref 65–99)
Glucose-Capillary: 110 mg/dL — ABNORMAL HIGH (ref 65–99)
Glucose-Capillary: 120 mg/dL — ABNORMAL HIGH (ref 65–99)
Glucose-Capillary: 115 mg/dL — ABNORMAL HIGH (ref 65–99)

## 2017-06-06 MED ORDER — POTASSIUM CHLORIDE CRYS ER 20 MEQ PO TBCR
20.0000 meq | EXTENDED_RELEASE_TABLET | Freq: Every day | ORAL | Status: DC
  Administered 2017-06-06: 20 meq via ORAL
  Filled 2017-06-06: qty 1

## 2017-06-06 MED ORDER — INSULIN ASPART 100 UNIT/ML ~~LOC~~ SOLN: 0.0000 [IU] | Freq: Every day | SUBCUTANEOUS | Status: DC

## 2017-06-06 MED ORDER — INSULIN ASPART 100 UNIT/ML ~~LOC~~ SOLN
0.0000 [IU] | Freq: Three times a day (TID) | SUBCUTANEOUS | Status: DC
  Administered 2017-06-07: 2 [IU] via SUBCUTANEOUS

## 2017-06-06 NOTE — Discharge Instructions (Signed)

## 2017-06-06 NOTE — Progress Notes (Signed)
Patient ID: Elijah Knapp, male   DOB: 09-04-1955, 62 y.o.   MRN: [id number]  TCTS Evening Rounds:  Hemodynamically stable today but BP up tonight.  Sinus tach 120's sats 96% on RA. Urine output ok Awaiting bed on 4E

## 2017-06-06 NOTE — Progress Notes (Addendum)
T[facility] DAILY ICU PROGRESS NOTE                   [address]            [place] [address]          [phone]   2 Days Post-Op Procedure(s) (LRB): CORONARY ARTERY BYPASS GRAFTING (CABG) x 3 WITH ENDOSCOPIC HARVESTING OF RIGHT SAPHENOUS VEIN (N/A) TRANSESOPHAGEAL ECHOCARDIOGRAM (TEE) (N/A)  Total Length of Stay:  LOS: 2 days   Subjective: Patient sitting in chair. Has a lot of gas.  Objective: Vital signs in last 24 hours: Temp:  [97.8 F (36.6 C)-99.2 F (37.3 C)] 98.2 F (36.8 C) (04/03 0700) Pulse Rate:  [85-107] 95 (04/03 0700) Cardiac Rhythm: Normal sinus rhythm (04/03 0600) Resp:  [4-21] 14 (04/03 0700) BP: (91-133)/(64-121) 133/121 (04/03 0700) SpO2:  [94 %-99 %] 94 % (04/03 0700) Arterial Line BP: (85-163)/(42-74) 127/58 (04/02 1815) Weight:  [139 lb 15.9 oz (63.5 kg)] 139 lb 15.9 oz (63.5 kg) (04/03 0600)  Filed Weights   06/04/17 1604 06/05/17 0615 06/06/17 0600  Weight: 164 lb 0.4 oz (74.4 kg) 182 lb 8.7 oz (82.8 kg) 139 lb 15.9 oz (63.5 kg)    Weight change: -24 lb 0.5 oz (-10.9 kg)   Hemodynamic parameters for last 24 hours: PAP: (31)/(22) 31/22  Intake/Output from previous day: 04/02 0701 - 04/03 0700 In: 2266.8 [P.O.:780; I.V.:1186.8; IV Piggyback:300] Out: 4081 [Urine:1305; Chest Tube:300]  Intake/Output this shift: No intake/output data recorded.  Current Meds: Scheduled Meds: . acetaminophen  1,000 mg Oral Q6H   Or  . acetaminophen (TYLENOL) oral liquid 160 mg/5 mL  1,000 mg Per Tube Q6H  . aspirin EC  325 mg Oral Daily   Or  . aspirin  324 mg Per Tube Daily  . bisacodyl  10 mg Oral Daily   Or  . bisacodyl  10 mg Rectal Daily  . Chlorhexidine Gluconate Cloth  6 each Topical Daily  . docusate sodium  200 mg Oral Daily  . furosemide  20 mg Intravenous BID  . insulin aspart  0-24 Units Subcutaneous Q4H  . metoCLOPramide (REGLAN) injection  10 mg Intravenous Q6H  . metoprolol tartrate  12.5 mg Oral BID   Or  .  metoprolol tartrate  12.5 mg Per Tube BID  . pantoprazole  40 mg Oral Daily  . sodium chloride flush  10-40 mL Intracatheter Q12H  . sodium chloride flush  3 mL Intravenous Q12H   Continuous Infusions: . sodium chloride 20 mL/hr at 06/05/17 1900  . sodium chloride    . sodium chloride Stopped (06/05/17 0911)  .  ceFAZolin (ANCEF) IV Stopped (06/06/17 4481)  . dexmedetomidine (PRECEDEX) IV infusion Stopped (06/05/17 0500)  . DOPamine 1 mcg/kg/min (06/06/17 0850)  . lactated ringers    . lactated ringers Stopped (06/04/17 1421)  . lactated ringers 20 mL/hr at 06/05/17 1900  . nitroGLYCERIN 0 mcg/min (06/04/17 1421)  . phenylephrine (NEO-SYNEPHRINE) Adult infusion Stopped (06/05/17 1746)   PRN Meds:.sodium chloride, ALPRAZolam, lactated ringers, metoprolol tartrate, morphine injection, ondansetron (ZOFRAN) IV, oxyCODONE, sodium chloride flush, sodium chloride flush, traMADol  General appearance: cooperative and no distress Neurologic: intact Heart: RRR Lungs: Slightly diminished at bases Abdomen: Soft, non tender, sporadic bowel sounds Extremities: Mild LE edema Wound: Aquacel intact on sternum. RLE wounds are clean and dry  Lab Results: CBC: Recent Labs    06/05/17 1531 06/05/17 1537 06/06/17 0528  WBC 9.2  --  7.4  HGB  10.6* 10.9* 10.0*  HCT 32.9* 32.0* 29.7*  PLT 156  --  137*   BMET:  Recent Labs    06/05/17 0334  06/05/17 1537 06/06/17 0528  NA 137  --  136 135  K 4.0  --  4.1 4.4  CL 103  --  99* 100*  CO2 25  --   --  27  GLUCOSE 120*  --  156* 116*  BUN 9  --  12 13  CREATININE 0.86   < > 0.90 1.01  CALCIUM 7.7*  --   --  8.4*   < > = values in this interval not displayed.    CMET: Lab Results  Component Value Date   WBC 7.4 06/06/2017   HGB 10.0 (L) 06/06/2017   HCT 29.7 (L) 06/06/2017   PLT 137 (L) 06/06/2017   GLUCOSE 116 (H) 06/06/2017   ALT 22 05/31/2017   AST 26 05/31/2017   NA 135 06/06/2017   K 4.4 06/06/2017   CL 100 (L) 06/06/2017    CREATININE 1.01 06/06/2017   BUN 13 06/06/2017   CO2 27 06/06/2017   TSH 2.940 05/24/2017   INR 1.17 06/04/2017   HGBA1C 5.4 05/31/2017    PT/INR:  Recent Labs    06/04/17 1451  LABPROT 14.8  INR 1.17   Radiology: No results found.   Assessment/Plan: S/P Procedure(s) (LRB): CORONARY ARTERY BYPASS GRAFTING (CABG) x 3 WITH ENDOSCOPIC HARVESTING OF RIGHT SAPHENOUS VEIN (N/A) TRANSESOPHAGEAL ECHOCARDIOGRAM (TEE) (N/A)  1. CV-CO/CI 5.7/3.1.Dr. Prescott Gum reviewed EKG which showed diffuse ST elevations in multiple leads?epicarditis.  Wean Dopamine drip but will wean off this am. Also, on Lopressor 12.5 mg bid  2. Pulmonary-Chest tubes with 300 output last 24 hours. Will remove today. On 2 liters of oxygen via [place]. Will wean to room air over next few days. CXR this am is stable (no pneumothorax). Encourage incentive spirometer. 3. Volume overload-will give Lasix 20 mg IV bid 4. ABL anemia-H and H 10 and 29.7 5. CBGs 111/117/124. Pre op HGA1C 5.4. Will transition off Insulin drip and stop accu checks in am 6. Mild thrombocytopenia-platelets this am 137,000 7. Please see transfer orders   [name] PA-C 06/06/2017 8:51 AM  patient progressing after multivessel CABG Postop EKG shows diffuse mild ST segment elevation consistent with pericarditis-epicarditis.  The CPK-MB is < 10 consistent with nonischemic etiology.  Patient needs to be mobilized and ambulated and hopefully will be transferred to stepdown if bed available  patient examined and medical record reviewed,agree with above note. [name] 06/06/2017

## 2017-06-06 NOTE — Discharge Summary (Addendum)
Physician Discharge Summary       [address]       [place] [address]             [phone]    Patient ID: Elijah Knapp MRN: [id number] DOB/AGE: [birth year] 62 y.o.  Admit date: 06/04/2017 Discharge date: 06/10/2017  Admission Diagnoses: Coronary artery disease  Discharge Diagnoses:  1. S/p CABG x 3 2. ABL anemia 3. History of hypertension 4. History of COPD 5. History of OSA (on CPAP) 6. History of GERD 7. History of PTSD 8. History of arthritis    Procedure (s):  TRANSESOPHAGEAL ECHOCARDIOGRAM (TEE), MEDIAN STERNOTOMY for CORONARY ARTERY BYPASS GRAFTING (CABG) x 3 (LIMA to LAD, SVG to DIAGONAL, SVG to OM) WITH ENDOSCOPIC HARVESTING OF RIGHT GREATER SAPHENOUS VEIN and LEFT INTERNAL MAMMARY ARTERY HARVEST by Dr. Prescott Gum on 06/04/2017.   History of Presenting Illness: This is a 62 year old nondiabetic Caucasian male with hypertension, hyperlipidemia, and positive family history of CAD father died of MI[]  presents for evaluation of his recently diagnosed multivessel CAD with recommendation for CABG by his cardiologist, Dr. Quay Burow.  Patient has had several month history of exertional chest pain and tightness relieved by rest and more recently nocturnal chest pain relieved by nitroglycerin.  Patient's father died of an MI at age 64.  Patient states he had a history of hyperlipidemia in the past.  He currently has hypertension and is currently taking losartan and metoprolol.  Cardiac catheterization 2 days ago shows high-grade 90% stenosis of the LAD, diagonal, and circumflex marginal.  RCA is dominant with minimal disease.  LVEDP is normal at 13 mmHg.  LVEF is normal at 55-60%.  Procedure performed via right radial artery.  No significant bleeding complications.  Patient has had previous bilateral knee arthroscopy in the past at outside hospitals.  He states he had one procedure with respiratory difficulty and possible difficult  intubation.  Patient has history of sleep apnea and uses a nose pillow mask. Patient states he has had chronic problems with his lungs including shortness of breath with exertion and some asthma.  He states at some point he had PFTs was told he had a lungs of an 62 year old.  He denies any significant history of smoking or environmental exposure although he does work as a Scientist, product/process development and has had one episode of expresses disclosure.  He served in the [employer] in the [place].   Brief Hospital Course:  The patient was extubated the evening of surgery without difficulty. He remained afebrile and hemodynamically stable. He was weaned off of Dopamine drip. Gordy Councilman, a line, chest tubes, and foley were removed early in the post operative course. Lopressor was started. He was volume over loaded and diuresed. He had ABL anemia. He did not require a post op transfusion. Last H and H was 8.9 and 27.1. He was started on Ferrous sulfate. He was weaned off the insulin drip.  Once he was tolerating a diet, home diabetic medicines were restarted.  The patient's HGA1C pre op was 5.4. He was restarted on Losartan for better control of his blood pressure. The patient was felt surgically stable for transfer from the ICU to [facility] for further convalescence on 06/07/2017. His HR and BP increased so Lopressor and Losartan were titrated accordingly. He was still a little tachycardic so Lopressor was increased to 50 mg bid. He does have a fair amount of anxiety. At patient request, he will be given Xanax ( 6 pills, no  refills) and he was instructed to follow up with his medical doctor. He continues to progress with cardiac rehab. He was ambulating on room air. He did have a fair amount of sputum so he was put on Mucinex to help with expectoration. He has been tolerating a diet and has had a bowel movement. Epicardial pacing wires were removed on 06/08/2017. Chest tube sutures will be removed today, the  of discharge. The patient is  felt surgically stable for discharge today.   Latest Vital Signs: Blood pressure 135/74, pulse (!) 109, temperature (!) 97.5 F (36.4 C), temperature source Oral, resp. rate 15, height 5\' 7"  (1.702 m), weight 159 lb 3.2 oz (72.2 kg), SpO2 100 %.  Physical Exam: Cardiovascular: RRR Pulmonary: Clear to auscultation bilaterally Abdomen: Soft, non tender, bowel sounds present. Extremities: Trace bilateral lower extremity edema. Wounds: Clean and dry.  No erythema or signs of infection.  Discharge Condition:Stable and discharged to home.  Recent laboratory studies:  Lab Results  Component Value Date   WBC 5.3 06/08/2017   HGB 8.9 (L) 06/08/2017   HCT 27.1 (L) 06/08/2017   MCV 95.8 06/08/2017   PLT 177 06/08/2017   Lab Results  Component Value Date   NA 136 06/08/2017   K 3.6 06/08/2017   CL 99 (L) 06/08/2017   CO2 28 06/08/2017   CREATININE 0.82 06/08/2017   GLUCOSE 127 (H) 06/08/2017    Diagnostic Studies: Dg Chest 2 View  Result Date: 06/07/2017 CLINICAL DATA:  Postop.  Pneumothorax. EXAM: CHEST - 2 VIEW COMPARISON:  Chest x-ray from yesterday FINDINGS: Status post CABG. IJ sheath and thoracic drains have been removed. No visible pneumothorax or pneumomediastinum. Trace pleural effusions. IMPRESSION: 1. No pneumothorax after thoracic drain removal. 2. Trace pleural fluid. Electronically Signed   By: Monte Fantasia M.D.   On: 06/07/2017 09:29   Ct Chest W Contrast  Result Date: 05/31/2017 CLINICAL DATA:  Reported history of chronic lung disease. Preoperative evaluation for multi-vessel CABG. EXAM: CT CHEST WITH CONTRAST TECHNIQUE: Multidetector CT imaging of the chest was performed during intravenous contrast administration. CONTRAST:  58mL ISOVUE-300 IOPAMIDOL (ISOVUE-300) INJECTION 61% COMPARISON:  04/24/2017 chest radiograph. FINDINGS: Motion degraded scan, limiting assessment. Cardiovascular: Normal heart size. No significant pericardial fluid/thickening. Three-vessel coronary  atherosclerosis. Atherosclerotic nonaneurysmal thoracic aorta. Normal caliber pulmonary arteries. No central pulmonary emboli. Mediastinum/Nodes: No discrete thyroid nodules. Unremarkable esophagus. No pathologically enlarged axillary, mediastinal or hilar lymph nodes. Lungs/Pleura: No pneumothorax. No pleural effusion. No acute consolidative airspace disease, lung masses or significant pulmonary nodules. Diffuse bronchial wall thickening. No significant regions of bronchiectasis on this motion degraded scan. Upper abdomen: Scattered subcentimeter hypodense renal cortical lesions in the upper left kidney, too small to characterize, which require no follow-up. Musculoskeletal: No aggressive appearing focal osseous lesions. Marked thoracic spondylosis. IMPRESSION: 1. Nonspecific diffuse bronchial wall thickening, which can be seen with reactive airways disease and/or chronic bronchitis. 2. Otherwise no active pulmonary disease. 3. Three-vessel coronary atherosclerosis. Aortic Atherosclerosis (ICD10-I70.0). Electronically Signed   By: Ilona Sorrel M.D.   On: 05/31/2017 12:18   Discharge Instructions    Amb Referral to Cardiac Rehabilitation   Complete by:  As directed    Diagnosis:  CABG   CABG X ___:  3     Discharge Medications: Allergies as of 06/10/2017   No Known Allergies     Medication List    STOP taking these medications   hydrochlorothiazide 25 MG tablet Commonly known as:  HYDRODIURIL   nitroGLYCERIN 0.4 MG/SPRAY  spray Commonly known as:  NITROLINGUAL     TAKE these medications   ALPRAZolam 0.5 MG tablet Commonly known as:  XANAX Take 1 tablet (0.5 mg total) by mouth 2 (two) times daily as needed for anxiety.   aspirin 325 MG EC tablet Take 1 tablet (325 mg total) by mouth daily. What changed:    medication strength  how much to take   ferrous sulfate 325 (65 FE) MG tablet Take 1 tablet (325 mg total) by mouth daily with breakfast. For one month then stop.   guaiFENesin  600 MG 12 hr tablet Commonly known as:  MUCINEX Take 1 tablet (600 mg total) by mouth 2 (two) times daily as needed for cough or to loosen phlegm.   losartan 100 MG tablet Commonly known as:  COZAAR Take 100 mg by mouth daily.   metoprolol tartrate 50 MG tablet Commonly known as:  LOPRESSOR Take 50 mg by mouth 2 (two) times daily.   omeprazole 20 MG capsule Commonly known as:  PRILOSEC Take 1 capsule (20 mg total) by mouth daily.   oxyCODONE 5 MG immediate release tablet Commonly known as:  Oxy IR/ROXICODONE Take 5 mg by mouth every 4-6 hours PRN severe pain.   Vitamin D3 1000 units Caps Take 1,000 Units by mouth daily.      The patient has been discharged on:   1.Beta Blocker:  Yes [  x ]                              No   [   ]                              If No, reason:  2.Ace Inhibitor/ARB: Yes [ x  ]                                     No  [    ]                                     If No, reason:  3.Statin:   Yes [ x  ]                  No  [   ]                  If No, reason:  4.Ecasa:  Yes  [ x  ]                  No   [   ]                  If No, reason:  Follow Up Appointments: Follow-up Information    Prescott Gum, Collier Salina, MD. Go on 07/11/2017.   Specialty:  Cardiothoracic Surgery Why:  PA/LAT CXR to be taken (at [facility] which is in the same building as Dr. Lucianne Lei Trigt's office) on 07/11/2017 at 11:00 am; Appointment time is at 11:30 am Contact information: [address] [place] [address] [place], [place], [place]. Go on 06/26/2017.   Specialties:  Cardiology, Radiology Why:  Appointment time is at 11:00  am Contact information: [place] [address] [phone]           Signed: Sharalyn Ink [facility] 06/10/2017, 9:43 AM   patient examined and medical record reviewed,agree with above note. [name] 06/12/2017

## 2017-06-06 NOTE — Progress Notes (Signed)
EKG CRITICAL VALUE     12 lead EKG performed.  Critical value noted.  Ebony Hail , RN notified.   Delfin Edis, [place] 06/06/2017 7:23 AM

## 2017-06-07 ENCOUNTER — Inpatient Hospital Stay (HOSPITAL_COMMUNITY): Payer: 59

## 2017-06-07 LAB — CBC
HCT: 27.9 % — ABNORMAL LOW (ref 39.0–52.0)
Hemoglobin: 9.3 g/dL — ABNORMAL LOW (ref 13.0–17.0)
MCH: 32.5 pg (ref 26.0–34.0)
MCHC: 33.3 g/dL (ref 30.0–36.0)
MCV: 97.6 fL (ref 78.0–100.0)
Platelets: 135 10*3/uL — ABNORMAL LOW (ref 150–400)
RBC: 2.86 MIL/uL — AB (ref 4.22–5.81)
RDW: 13.5 % (ref 11.5–15.5)
WBC: 6.1 10*3/uL (ref 4.0–10.5)

## 2017-06-07 LAB — BASIC METABOLIC PANEL
ANION GAP: 8 (ref 5–15)
BUN: 13 mg/dL (ref 6–20)
CALCIUM: 8.4 mg/dL — AB (ref 8.9–10.3)
CHLORIDE: 98 mmol/L — AB (ref 101–111)
CO2: 28 mmol/L (ref 22–32)
CREATININE: 0.9 mg/dL (ref 0.61–1.24)
GFR calc non Af Amer: >60 mL/min (ref >60)
Glucose, Bld: 121 mg/dL — ABNORMAL HIGH (ref 65–99)
Potassium: 4.2 mmol/L (ref 3.5–5.1)
SODIUM: 134 mmol/L — AB (ref 135–145)

## 2017-06-07 LAB — GLUCOSE, CAPILLARY
Glucose-Capillary: 111 mg/dL — ABNORMAL HIGH (ref 65–99)
Glucose-Capillary: 119 mg/dL — ABNORMAL HIGH (ref 65–99)
Glucose-Capillary: 130 mg/dL — ABNORMAL HIGH (ref 65–99)
Glucose-Capillary: 111 mg/dL — ABNORMAL HIGH (ref 65–99)

## 2017-06-07 MED ORDER — LOSARTAN POTASSIUM 25 MG PO TABS
25.0000 mg | ORAL_TABLET | Freq: Every day | ORAL | Status: DC
  Administered 2017-06-07: 25 mg via ORAL
  Filled 2017-06-07: qty 1

## 2017-06-07 MED ORDER — POTASSIUM CHLORIDE CRYS ER 20 MEQ PO TBCR
40.0000 meq | EXTENDED_RELEASE_TABLET | Freq: Every day | ORAL | Status: DC
  Administered 2017-06-07 – 2017-06-10 (×4): 40 meq via ORAL
  Filled 2017-06-07 (×4): qty 2

## 2017-06-07 MED ORDER — METOPROLOL TARTRATE 25 MG PO TABS
25.0000 mg | ORAL_TABLET | Freq: Two times a day (BID) | ORAL | Status: DC
  Administered 2017-06-07 (×2): 25 mg via ORAL
  Filled 2017-06-07 (×2): qty 1

## 2017-06-07 NOTE — Progress Notes (Signed)
CARDIAC REHAB PHASE I   PRE:  Rate/Rhythm: 101 ST  BP:  Supine: 142/75     SaO2: 95% RA  MODE:  Ambulation: 400 ft   POST:  Rate/Rhythm: 117 ST  BP:  Sitting: 147/72   SaO2: 100% RA  1449-1510  Pt ambulated 400 ft with RW and one assist.  No complaints during walk. Sternal precautions reinforced with pt.  Pt assisted back to bed per his request with call bell back in reach. Bedside RN in room.   Noel Christmas, RN 06/07/2017 3:06 PM

## 2017-06-07 NOTE — Progress Notes (Signed)
Patient arrived to unit at approximately 1210. Patient VS obtained. Afebrile. No acute distress. Tele monitoring in  Place. CHG bath given. Patient oriented to room and unit. Personal belonging placed in closet. Call light in reach.

## 2017-06-07 NOTE — Progress Notes (Addendum)
[facility] DAILY ICU PROGRESS NOTE                   [address]            [place] [address]          [phone]   3 Days Post-Op Procedure(s) (LRB): CORONARY ARTERY BYPASS GRAFTING (CABG) x 3 WITH ENDOSCOPIC HARVESTING OF RIGHT SAPHENOUS VEIN (N/A) TRANSESOPHAGEAL ECHOCARDIOGRAM (TEE) (N/A)  Total Length of Stay:  LOS: 3 days   Subjective:  No new complaints. Awaiting transfer to 4E  Objective: Vital signs in last 24 hours: Temp:  [98.5 F (36.9 C)-99.9 F (37.7 C)] 98.7 F (37.1 C) (04/04 0428) Pulse Rate:  [89-119] 104 (04/04 0700) Cardiac Rhythm: Normal sinus rhythm (04/04 0400) Resp:  [7-22] 18 (04/04 0700) BP: (101-180)/(58-80) 146/80 (04/04 0700) SpO2:  [90 %-100 %] 97 % (04/04 0700) Weight:  [165 lb 12.6 oz (75.2 kg)] 165 lb 12.6 oz (75.2 kg) (04/04 0500)  Filed Weights   06/05/17 0615 06/06/17 0600 06/07/17 0500  Weight: 182 lb 8.7 oz (82.8 kg) 139 lb 15.9 oz (63.5 kg) 165 lb 12.6 oz (75.2 kg)    Weight change: 25 lb 12.7 oz (11.7 kg)   Intake/Output from previous day: 04/03 0701 - 04/04 0700 In: 372 [P.O.:240; I.V.:122; IV Piggyback:10] Out: 1730 [Urine:1730]  Current Meds: Scheduled Meds: . acetaminophen  1,000 mg Oral Q6H   Or  . acetaminophen (TYLENOL) oral liquid 160 mg/5 mL  1,000 mg Per Tube Q6H  . aspirin EC  325 mg Oral Daily   Or  . aspirin  324 mg Per Tube Daily  . bisacodyl  10 mg Oral Daily   Or  . bisacodyl  10 mg Rectal Daily  . Chlorhexidine Gluconate Cloth  6 each Topical Daily  . docusate sodium  200 mg Oral Daily  . furosemide  20 mg Intravenous BID  . insulin aspart  0-15 Units Subcutaneous TID WC  . insulin aspart  0-5 Units Subcutaneous QHS  . losartan  25 mg Oral Daily  . metoCLOPramide (REGLAN) injection  10 mg Intravenous Q6H  . metoprolol tartrate  25 mg Oral BID  . pantoprazole  40 mg Oral Daily  . potassium chloride  20 mEq Oral Daily  . sodium chloride flush  10-40 mL Intracatheter Q12H  . sodium  chloride flush  3 mL Intravenous Q12H   Continuous Infusions: . sodium chloride Stopped (06/06/17 0700)  . sodium chloride    . sodium chloride Stopped (06/05/17 0911)   PRN Meds:.sodium chloride, ALPRAZolam, metoprolol tartrate, morphine injection, ondansetron (ZOFRAN) IV, oxyCODONE, sodium chloride flush, sodium chloride flush, traMADol  General appearance: alert, cooperative and no distress Heart: regular rate and rhythm Lungs: clear to auscultation bilaterally Abdomen: soft, non-tender; bowel sounds normal; no masses,  no organomegaly Extremities: extremities normal, atraumatic, no cyanosis or edema Wound: clean and dry  Lab Results: CBC: Recent Labs    06/06/17 0528 06/07/17 0402  WBC 7.4 6.1  HGB 10.0* 9.3*  HCT 29.7* 27.9*  PLT 137* 135*   BMET:  Recent Labs    06/06/17 0528 06/07/17 0402  NA 135 134*  K 4.4 4.2  CL 100* 98*  CO2 27 28  GLUCOSE 116* 121*  BUN 13 13  CREATININE 1.01 0.90  CALCIUM 8.4* 8.4*    CMET: Lab Results  Component Value Date   WBC 6.1 06/07/2017   HGB 9.3 (L) 06/07/2017   HCT 27.9 (L) 06/07/2017  PLT 135 (L) 06/07/2017   GLUCOSE 121 (H) 06/07/2017   ALT 22 05/31/2017   AST 26 05/31/2017   NA 134 (L) 06/07/2017   K 4.2 06/07/2017   CL 98 (L) 06/07/2017   CREATININE 0.90 06/07/2017   BUN 13 06/07/2017   CO2 28 06/07/2017   TSH 2.940 05/24/2017   INR 1.17 06/04/2017   HGBA1C 5.4 05/31/2017   PT/INR:  Recent Labs    06/04/17 1451  LABPROT 14.8  INR 1.17   Radiology: No results found.  Assessment/Plan: S/P Procedure(s) (LRB): CORONARY ARTERY BYPASS GRAFTING (CABG) x 3 WITH ENDOSCOPIC HARVESTING OF RIGHT SAPHENOUS VEIN (N/A) TRANSESOPHAGEAL ECHOCARDIOGRAM (TEE) (N/A)  1. CV- NSR, Hypertension- will continue Lopressor, will restart home Cozaar at reduced dose 2. Pulm- wean oxygen as tolerated, continue IS 3. Renal- creatinine is stable, weight is at 165, some edema on exam, on IV diuresis 4. Expected post operative  blood loss anemia, mild at 9.3 5. CBGs controlled, patient is not a diabetic, will d/c SSIP 6. Dispo- patient stable, start low dose Cozaar for HTN, continue diuretics, awaiting transfer to telemetry unit, will d/c EPW in AM if rhythm remains stable     Ellwood Handler 06/07/2017 8:39 AM    patient examined and medical record reviewed,agree with above note. [name] 06/07/2017

## 2017-06-07 NOTE — Plan of Care (Signed)
Patient ambulated in hallway x1 today. Sat up in chair for 2.5 hours. Using IS with encouragement and is on RA during the day. Voided in urinal after having foley catheter removed. Tolerating PO pain meds and stating improvement in pain level. Continues to have a poor appetite, usually eats <50% of meals. Will be transferred to 4E today.   Joellen Jersey, RN

## 2017-06-08 LAB — GLUCOSE, CAPILLARY
Glucose-Capillary: 102 mg/dL — ABNORMAL HIGH (ref 65–99)
Glucose-Capillary: 128 mg/dL — ABNORMAL HIGH (ref 65–99)
Glucose-Capillary: 101 mg/dL — ABNORMAL HIGH (ref 65–99)

## 2017-06-08 LAB — BASIC METABOLIC PANEL
Anion gap: 9 (ref 5–15)
BUN: 11 mg/dL (ref 6–20)
CO2: 28 mmol/L (ref 22–32)
Calcium: 8.6 mg/dL — ABNORMAL LOW (ref 8.9–10.3)
Chloride: 99 mmol/L — ABNORMAL LOW (ref 101–111)
Creatinine, Ser: 0.82 mg/dL (ref 0.61–1.24)
GFR calc Af Amer: >60 mL/min (ref >60)
GFR calc non Af Amer: >60 mL/min (ref >60)
Glucose, Bld: 127 mg/dL — ABNORMAL HIGH (ref 65–99)
Potassium: 3.6 mmol/L (ref 3.5–5.1)
Sodium: 136 mmol/L (ref 135–145)

## 2017-06-08 LAB — CBC
HCT: 27.1 % — ABNORMAL LOW (ref 39.0–52.0)
Hemoglobin: 8.9 g/dL — ABNORMAL LOW (ref 13.0–17.0)
MCH: 31.4 pg (ref 26.0–34.0)
MCHC: 32.8 g/dL (ref 30.0–36.0)
MCV: 95.8 fL (ref 78.0–100.0)
Platelets: 177 10*3/uL (ref 150–400)
RBC: 2.83 MIL/uL — ABNORMAL LOW (ref 4.22–5.81)
RDW: 13 % (ref 11.5–15.5)
WBC: 5.3 10*3/uL (ref 4.0–10.5)

## 2017-06-08 MED ORDER — METOPROLOL TARTRATE 25 MG PO TABS
37.5000 mg | ORAL_TABLET | Freq: Two times a day (BID) | ORAL | Status: DC
  Administered 2017-06-08 (×2): 37.5 mg via ORAL
  Filled 2017-06-08 (×3): qty 1

## 2017-06-08 MED ORDER — POTASSIUM CHLORIDE CRYS ER 20 MEQ PO TBCR
20.0000 meq | EXTENDED_RELEASE_TABLET | Freq: Once | ORAL | Status: AC
  Administered 2017-06-08: 20 meq via ORAL
  Filled 2017-06-08: qty 1

## 2017-06-08 MED ORDER — FUROSEMIDE 40 MG PO TABS
40.0000 mg | ORAL_TABLET | Freq: Every day | ORAL | Status: DC
  Administered 2017-06-08 – 2017-06-10 (×3): 40 mg via ORAL
  Filled 2017-06-08 (×4): qty 1

## 2017-06-08 MED ORDER — METOPROLOL TARTRATE 50 MG PO TABS: 50.0000 mg | ORAL_TABLET | Freq: Two times a day (BID) | ORAL | Status: DC

## 2017-06-08 MED ORDER — FERROUS SULFATE 325 (65 FE) MG PO TABS
325.0000 mg | ORAL_TABLET | Freq: Every day | ORAL | Status: DC
  Administered 2017-06-08 – 2017-06-10 (×3): 325 mg via ORAL
  Filled 2017-06-08 (×3): qty 1

## 2017-06-08 MED ORDER — LOSARTAN POTASSIUM 50 MG PO TABS
50.0000 mg | ORAL_TABLET | Freq: Every day | ORAL | Status: DC
  Administered 2017-06-08: 50 mg via ORAL
  Filled 2017-06-08 (×2): qty 1

## 2017-06-08 MED ORDER — LACTULOSE 10 GM/15ML PO SOLN
20.0000 g | Freq: Once | ORAL | Status: AC
  Administered 2017-06-08: 20 g via ORAL
  Filled 2017-06-08: qty 30

## 2017-06-08 MED FILL — Sodium Bicarbonate IV Soln 8.4%: INTRAVENOUS | Qty: 50 | Status: AC

## 2017-06-08 MED FILL — Lidocaine HCl IV Inj 20 MG/ML: INTRAVENOUS | Qty: 5 | Status: AC

## 2017-06-08 MED FILL — Sodium Chloride IV Soln 0.9%: INTRAVENOUS | Qty: 3000 | Status: AC

## 2017-06-08 MED FILL — Heparin Sodium (Porcine) Inj 1000 Unit/ML: INTRAMUSCULAR | Qty: 10 | Status: AC

## 2017-06-08 MED FILL — Mannitol IV Soln 20%: INTRAVENOUS | Qty: 500 | Status: AC

## 2017-06-08 MED FILL — Electrolyte-R (PH 7.4) Solution: INTRAVENOUS | Qty: 4000 | Status: AC

## 2017-06-08 NOTE — Plan of Care (Signed)
  Problem: Activity: Goal: Risk for activity intolerance will decrease Outcome: Progressing   Problem: Respiratory: Goal: Respiratory status will improve Outcome: Progressing   Problem: Skin Integrity: Goal: Risk for impaired skin integrity will decrease Outcome: Progressing   Problem: Coping: Goal: Level of anxiety will decrease Outcome: Progressing

## 2017-06-08 NOTE — Care Management Note (Signed)
Case Management Note Marvetta Gibbons RN, BSN Unit 4E-Case Manager [phone]  Patient Details  Name: Elijah Knapp MRN: [id number] Date of Birth: [birth year]  Subjective/Objective:   Pt admitted s/p CABGx3                 Action/Plan:  PTA pt lived at home alone, per cardiac rehab note pt needs RW for home- will need DME order placed prior to discharge for RW- CM will assist with DME once order placed- pt also does not plan on having anyone stay with him at discharge- has daughter and Sig.other- has been educated by Cardiac rehab of there importance of having supervision arranged for discharge-pt would not qualify for skilled placement- insurance would not cover pt is  ambulating 300 ft-   CM to follow for transition of care needs  Expected Discharge Date:                  Expected Discharge Plan:  Home/Self Care  In-House Referral:  NA  Discharge planning Services  CM Consult  Post Acute Care Choice:  Durable Medical Equipment Choice offered to:  Patient  DME Arranged:  [facility] DME Agency:  [place] Arranged:    [facility] Agency:     Status of Service:  In process, will continue to follow  If discussed at Long Length of Stay Meetings, dates discussed:    Discharge Disposition:   Additional Comments:  Dawayne Patricia, RN 06/08/2017, 1:46 PM

## 2017-06-08 NOTE — Op Note (Signed)
NAMEHERNANDO, REALI NO.:  [id number]  MEDICAL RECORD NO.:  [id number]  LOCATION:  [id number]                        FACILITY:  [facility]  PHYSICIAN:  Ivin Poot, M.D.  DATE OF BIRTH:  Jan 24, 1956  DATE OF PROCEDURE:  06/04/2017 DATE OF DISCHARGE:                              OPERATIVE REPORT   OPERATIONS: 1. Coronary artery bypass grafting x3 (left internal mammary artery to     left anterior descending artery, saphenous vein graft to diagonal,     saphenous vein graft to circumflex marginal). 2. Endoscopic harvest of right leg greater saphenous vein.  PREOPERATIVE DIAGNOSIS:  Class IV progressive angina with multivessel coronary artery disease.  POSTOPERATIVE DIAGNOSIS:  Class IV progressive angina with multivessel coronary artery disease.  SURGEON:  Ivin Poot, M.D.  ASSISTANT:  Lars Pinks, PA-C.  ANESTHESIA:  General.  CLINICAL NOTE:  The patient is a 62 year old nondiabetic male with hypertension, hyperlipidemia and positive family history of CAD.  The patient had several weeks of chest pain consistent with angina.  He was seen by his cardiologist, Dr. Gwenlyn Found, who recommended cardiac catheterization.  This demonstrated a high-grade 90% stenosis of the LAD, diagonal and also 90% stenosis of the circumflex marginal.  The RCA had minimal disease and ejection fraction was 55 to 60%.  Because of the coronary anatomy, the patient was recommended for CABG.  I examined the patient in the office in consultation after reviewing the coronary angiograms.  I examined the patient and felt that CABG was the best long- term therapy of this patient and agreed with his cardiologist for scheduling CABG.  I discussed the major details of surgery including the use of general anesthesia and cardiopulmonary bypass, the location of the surgical incisions, and the expected recovery.  I also discussed the potential risks of CABG with the patient including the risk  of stroke, bleeding, blood transfusion, infection, postoperative pulmonary problems including pleural effusion, organ failure and death.  After reviewing these issues, he demonstrated his understanding and agreed to proceed with surgery under what I felt was an informed consent.  OPERATIVE FINDINGS: 1. Severe coronary artery disease. 2. Adequate targets, although, there is diffuse disease in the LAD     with soft plaque. 3. No blood products required. 4. Preservation of global LV function after separation from     cardiopulmonary bypass.  DESCRIPTION OF PROCEDURE:  The patient was brought from preop holding to the OR and placed supine on the operating table.  General anesthesia was induced.  A transesophageal echo probe was placed by the Anesthesia Team.  The patient was prepped and draped as a sterile field.  A proper time-out was performed.  A sternal incision was made as the saphenous vein was harvested endoscopically from the right leg.  The mammary artery was harvested as a pedicle graft from its origin at the subclavian vessels.  The sternal retractor was placed.  Pursestrings were placed in the ascending aorta and right atrium, and heparin was administered.  When the ACT was documented as being therapeutic, the patient was cannulated and placed on cardiopulmonary bypass.  The coronaries were then identified for grafting and the conduit was prepared for the distal  anastomoses.  Cardioplegia cannulas were then placed for both antegrade and retrograde cold blood cardioplegia.  The patient was cooled to 32 degrees.  Aortic crossclamp was applied.  One liter of cold blood cardioplegia was delivered in split doses between the antegrade aortic and retrograde coronary sinus catheters.  There was good cardioplegic arrest and septal temperature dropped less than 12 degrees.  Cardioplegia was delivered at every 20 minutes.  The distal coronary anastomoses were then performed.  The first  distal anastomosis was to the OM branch of the circumflex.  This was intramyocardial and had a tight proximal 90% stenosis.  A reversed saphenous vein was sewn end-to-side with running 7-0 Prolene with good flow through the graft.  Cardioplegia was redosed.  The second distal anastomosis was to the diagonal branch of the LAD. This had an ostial 90% stenosis.  A reversed saphenous vein was sewn end- to-side with running 7-0 Prolene with good flow through the graft. Cardioplegia was redosed.  The third distal anastomosis was to the mid distal third of the LAD. This had a proximal 90% stenosis.  The left IMA pedicle was brought through an opening and then left lateral pericardium was brought down onto the LAD and sewn end-to-side with running 8-0 Prolene.  There was good flow through the anastomosis after briefly releasing the pedicle bulldog on the mammary artery.  The bulldog was reapplied and the pedicle was secured to the epicardium.  Cardioplegia was redosed.  While the crossclamp was still in place, two proximal vein anastomoses were performed on the ascending aorta using a 4.5-mm punch with running 6-0 Prolene.  Prior to tying down the final proximal anastomosis, the air was vented from the coronaries with a dose of retrograde warm blood cardioplegia.  The crossclamp was removed.  The heart resumed a spontaneous rhythm.  The vein grafts were de-aired and opened, each had good flow and hemostasis was documented at the proximal and distal anastomoses.  The cardioplegia cannulas were removed.  The patient was rewarmed and reperfused.  Temporary pacing wires were applied.  The lungs were expanded.  The patient was then prepared to wean off bypass.  This occurred without difficulty and very smoothly.  Hemodynamics remained normal.  Echo showed preserved normal global LV function.  Protamine was administered without adverse reaction.  The cannulas were removed.  The mediastinum was  irrigated. The superior pericardial fat was closed over the aorta.  Anterior mediastinal and left pleural chest tubes were placed and brought out through separate incisions.  The sternum was closed with wire.  The pectoralis fascia was closed with a running #1 Vicryl.  The subcutaneous and skin layers were closed with in running Vicryl and sterile dressings were applied.  Total cardiopulmonary bypass time was 129 minutes.     Ivin Poot, M.D.     PV/MEDQ  D:  06/07/2017  T:  06/08/2017  Job:  [id number]  cc:   Quay Burow, M.D.

## 2017-06-08 NOTE — Progress Notes (Signed)
Removed pacing wires per provider order. Patient VS are stable, patient tolerated removal well. Wire ends were intact when removed. Site is clean dry and intact. Patient will remain on bedrest for one hour. Will continue to monitor patient for any complications.

## 2017-06-08 NOTE — Progress Notes (Signed)
CARDIAC REHAB PHASE I   PRE:  Rate/Rhythm: 110 ST    BP: sitting     SaO2:   MODE:  Ambulation: 300 ft   POST:  Rate/Rhythm: 118 ST    BP: sitting 150/91     SaO2: 100 RA  Pt sitting on edge of chair. Seems uncomfortable but I think he is anxious. Sts he is fine. Discussed sternal precautions/Move in the Tube. Pt has been walking with RW numerous times today. I walked short walk with him without RW. He had knee surgery last fall and hesitates on that leg. Suggest RW for home for when he is alone. Pt tells me he lives alone and is not planning on anyone being with him at d/c. I discussed with him talking with family about coming in for supervision and assist with household responsibilities. HR and BP up with walking. Pt seems quite anxious or in pain but when I ask him, he denies needing anything (pain meds, etc). Likes to sit on edge of chair. Ed completed and will send referral to [facility]. Gave video to watch. [place], ACSM 06/08/2017 11:44 AM

## 2017-06-08 NOTE — Progress Notes (Signed)
      [address]       [place] [address]             [phone]        4 Days Post-Op Procedure(s) (LRB): CORONARY ARTERY BYPASS GRAFTING (CABG) x 3 WITH ENDOSCOPIC HARVESTING OF RIGHT SAPHENOUS VEIN (N/A) TRANSESOPHAGEAL ECHOCARDIOGRAM (TEE) (N/A)  Subjective: Patient passing flatus but no bowel movement yet.  Objective: Vital signs in last 24 hours: Temp:  [97.6 F (36.4 C)-99.3 F (37.4 C)] 99.2 F (37.3 C) (04/05 0539) Pulse Rate:  [90-114] 114 (04/05 0539) Cardiac Rhythm: Normal sinus rhythm (04/04 2130) Resp:  [17-26] 26 (04/05 0539) BP: (109-161)/(59-87) 161/81 (04/05 0539) SpO2:  [92 %-97 %] 96 % (04/04 1120) Weight:  [161 lb 1.6 oz (73.1 kg)] 161 lb 1.6 oz (73.1 kg) (04/05 0500)  Pre op weight 74.4 kg Current Weight  06/08/17 161 lb 1.6 oz (73.1 kg)       Intake/Output from previous day: 04/04 0701 - 04/05 0700 In: 540 [P.O.:540] Out: 550 [Urine:550]   Physical Exam:  Cardiovascular: RRR Pulmonary: Clear to auscultation bilaterally Abdomen: Soft, non tender, bowel sounds present. Extremities: Trace bilateral lower extremity edema. Wounds: Clean and dry.  No erythema or signs of infection.  Lab Results: CBC: Recent Labs    06/07/17 0402 06/08/17 0303  WBC 6.1 5.3  HGB 9.3* 8.9*  HCT 27.9* 27.1*  PLT 135* 177   BMET:  Recent Labs    06/07/17 0402 06/08/17 0303  NA 134* 136  K 4.2 3.6  CL 98* 99*  CO2 28 28  GLUCOSE 121* 127*  BUN 13 11  CREATININE 0.90 0.82  CALCIUM 8.4* 8.6*    PT/INR:  Lab Results  Component Value Date   INR 1.17 06/04/2017   INR 0.82 05/31/2017   INR 0.9 05/24/2017   ABG:  INR: Will add last result for INR, ABG once components are confirmed Will add last 4 CBG results once components are confirmed  Assessment/Plan: 1. CV- ST in the 110's and hypertensive.On Lopressor 25 mg bid and Losartan 25 mg daily. Will increase both Lopressor and Losartan. 2. Pulm- On room air this  am.Encourage incentive spirometer. 3. Volume overload-will continue with Lasix 40 mg daily. May not need at discharge 4. Expected post operative blood loss anemia-H and H this am 8.9 and 27.1 5. CBGs 130/111/102. Pre op HGA1C 5.4. Stop accu checks and SS PRN 6. Thrombocytopenia resolved-platelets up to 177,000 7. Remove EPW 8. LOC constipation 9. Likely home 1-2 days.  Jowanda Heeg M ZimmermanPA-C 06/08/2017,7:09 AM

## 2017-06-08 NOTE — Plan of Care (Signed)
  Problem: Activity: Goal: Risk for activity intolerance will decrease Outcome: Progressing   Problem: Respiratory: Goal: Respiratory status will improve Outcome: Progressing   Problem: Clinical Measurements: Goal: Respiratory complications will improve Outcome: Progressing Goal: Cardiovascular complication will be avoided Outcome: Progressing   Problem: Urinary Elimination: Goal: Ability to achieve and maintain adequate renal perfusion and functioning will improve Outcome: Completed/Met   Problem: Pain Managment: Goal: General experience of comfort will improve Outcome: Completed/Met

## 2017-06-09 MED ORDER — LOSARTAN POTASSIUM 50 MG PO TABS
100.0000 mg | ORAL_TABLET | Freq: Every day | ORAL | Status: DC
  Administered 2017-06-09 – 2017-06-10 (×2): 100 mg via ORAL
  Filled 2017-06-09 (×2): qty 2

## 2017-06-09 MED ORDER — METOPROLOL TARTRATE 50 MG PO TABS
50.0000 mg | ORAL_TABLET | Freq: Two times a day (BID) | ORAL | Status: DC
  Administered 2017-06-09 (×2): 50 mg via ORAL
  Filled 2017-06-09 (×2): qty 1

## 2017-06-09 MED ORDER — GUAIFENESIN ER 600 MG PO TB12
600.0000 mg | ORAL_TABLET | Freq: Two times a day (BID) | ORAL | Status: DC
  Administered 2017-06-09 – 2017-06-10 (×3): 600 mg via ORAL
  Filled 2017-06-09 (×3): qty 1

## 2017-06-09 NOTE — Progress Notes (Addendum)
Patient c/o having "phelgm balls". Patient stated that he could not get them to clear. Sputum was clear and thin. Patient c/o being a little trouble breathing. Patient's O2 sats were 96% on RA. Placed patient on 2L O2 for comfort,  O2 sats 97-100%. Lung sounds diminished. Patient stated he was a little anxious. Gave 0.5 mg xanax and ask the patient to minimize his walking until MD rounds in the morning. Encourage patient to use incentive. Patient was only able to get to 500 on IS. Asked the patient to use IS every hour that he was awake at least 10 times.   Patient c/o being anxious because of his surgery and the state that he is in. This RN reassured the patient that it will get easier as the days progress. Will continue to monitor.

## 2017-06-09 NOTE — Progress Notes (Addendum)
      [address]       [place] [address]             [phone]        5 Days Post-Op Procedure(s) (LRB): CORONARY ARTERY BYPASS GRAFTING (CABG) x 3 WITH ENDOSCOPIC HARVESTING OF RIGHT SAPHENOUS VEIN (N/A) TRANSESOPHAGEAL ECHOCARDIOGRAM (TEE) (N/A)  Subjective: Patient had "phlegm balls" earlier this am. Has a lot of anxiety.  Objective: Vital signs in last 24 hours: Temp:  [98 F (36.7 C)-98.8 F (37.1 C)] 98 F (36.7 C) (04/06 0817) Pulse Rate:  [95-106] 102 (04/06 0817) Cardiac Rhythm: Normal sinus rhythm (04/06 0701) Resp:  [17-20] 20 (04/06 0300) BP: (118-150)/(62-83) 150/83 (04/06 0817) SpO2:  [95 %-100 %] 100 % (04/06 0817) Weight:  [159 lb 3.2 oz (72.2 kg)] 159 lb 3.2 oz (72.2 kg) (04/06 0300)  Pre op weight 74.4 kg Current Weight  06/09/17 159 lb 3.2 oz (72.2 kg)      Intake/Output from previous day: 04/05 0701 - 04/06 0700 In: 480 [P.O.:480] Out: 300 [Urine:300]   Physical Exam:  Cardiovascular: Slightly tachycardic Pulmonary: Slightly diminished at bases Abdomen: Soft, non tender, bowel sounds present. Extremities: Mild bilateral lower extremity edema. Ecchymosis right thigh. Wounds: Clean and dry.  No erythema or signs of infection.  Lab Results: CBC: Recent Labs    06/07/17 0402 06/08/17 0303  WBC 6.1 5.3  HGB 9.3* 8.9*  HCT 27.9* 27.1*  PLT 135* 177   BMET:  Recent Labs    06/07/17 0402 06/08/17 0303  NA 134* 136  K 4.2 3.6  CL 98* 99*  CO2 28 28  GLUCOSE 121* 127*  BUN 13 11  CREATININE 0.90 0.82  CALCIUM 8.4* 8.6*    PT/INR:  Lab Results  Component Value Date   INR 1.17 06/04/2017   INR 0.82 05/31/2017   INR 0.9 05/24/2017   ABG:  INR: Will add last result for INR, ABG once components are confirmed Will add last 4 CBG results once components are confirmed  Assessment/Plan: 1. CV- ST in the 110's and hypertensive.On Lopressor 37.5 mg bid and Losartan 50 mg daily. Will increase Lopressor for  better HR control and Losartan for better BP control. 2. Pulm- He was put on 2 liters of oxygen via  last night when coughing a lot. Mucinex for cough. Encourage incentive spirometer. 3. Volume overload-on Lasix 40 mg daily.  4. Expected post operative blood loss anemia-H and H this am 8.9 and 27.1 5. Anxiety-Xanax PRN 6. Probably home in am. He states he can stay with girlfriend for a few days.  Donielle M ZimmermanPA-C 06/09/2017,9:03 AM Patient seen and examined, agree with above Probably home in AM  Bentley C. Roxan Hockey, MD [facility] [phone]

## 2017-06-10 MED ORDER — GUAIFENESIN ER 600 MG PO TB12: 600.0000 mg | ORAL_TABLET | Freq: Two times a day (BID) | ORAL | Status: DC | PRN

## 2017-06-10 MED ORDER — FERROUS SULFATE 325 (65 FE) MG PO TABS: 325.0000 mg | ORAL_TABLET | Freq: Every day | ORAL | 3 refills | Status: DC

## 2017-06-10 MED ORDER — ASPIRIN 325 MG PO TBEC: 325.0000 mg | DELAYED_RELEASE_TABLET | Freq: Every day | ORAL | 0 refills | Status: DC

## 2017-06-10 MED ORDER — METOPROLOL TARTRATE 50 MG PO TABS
50.0000 mg | ORAL_TABLET | Freq: Two times a day (BID) | ORAL | Status: DC
  Administered 2017-06-10: 50 mg via ORAL
  Filled 2017-06-10: qty 1

## 2017-06-10 MED ORDER — ALPRAZOLAM 0.5 MG PO TABS: 0.5000 mg | ORAL_TABLET | Freq: Two times a day (BID) | ORAL | 0 refills | Status: DC | PRN

## 2017-06-10 MED ORDER — OXYCODONE HCL 5 MG PO TABS: ORAL_TABLET | ORAL | 0 refills | Status: DC

## 2017-06-10 NOTE — Progress Notes (Addendum)
      [address]       [place] [address]             [phone]        6 Days Post-Op Procedure(s) (LRB): CORONARY ARTERY BYPASS GRAFTING (CABG) x 3 WITH ENDOSCOPIC HARVESTING OF RIGHT SAPHENOUS VEIN (N/A) TRANSESOPHAGEAL ECHOCARDIOGRAM (TEE) (N/A)  Subjective: Patient eating breakfast. He states he has less phlegm since being given Mucinex. He really wants to go home.  Objective: Vital signs in last 24 hours: Temp:  [98 F (36.7 C)-98.7 F (37.1 C)] 98.7 F (37.1 C) (04/07 0410) Pulse Rate:  [100-102] 100 (04/06 2048) Cardiac Rhythm: Normal sinus rhythm (04/07 0701) Resp:  [13-18] 15 (04/07 0410) BP: (120-150)/(60-83) 123/69 (04/07 0410) SpO2:  [94 %-100 %] 98 % (04/07 0410)  Pre op weight 74.4 kg Current Weight  06/09/17 159 lb 3.2 oz (72.2 kg)      Intake/Output from previous day: 04/06 0701 - 04/07 0700 In: 720 [P.O.:720] Out: -    Physical Exam:  Cardiovascular:Tachycardic Pulmonary: Slightly diminished at bases Abdomen: Soft, non tender, bowel sounds present. Extremities: Trace bilateral lower extremity edema. Ecchymosis right thigh. Wounds: Clean and dry.  No erythema or signs of infection.  Lab Results: CBC: Recent Labs    06/08/17 0303  WBC 5.3  HGB 8.9*  HCT 27.1*  PLT 177   BMET:  Recent Labs    06/08/17 0303  NA 136  K 3.6  CL 99*  CO2 28  GLUCOSE 127*  BUN 11  CREATININE 0.82  CALCIUM 8.6*    PT/INR:  Lab Results  Component Value Date   INR 1.17 06/04/2017   INR 0.82 05/31/2017   INR 0.9 05/24/2017   ABG:  INR: Will add last result for INR, ABG once components are confirmed Will add last 4 CBG results once components are confirmed  Assessment/Plan: 1. CV- ST in the 105-110's this time.On Lopressor 50 mg bid and Losartan 100 mg daily. Will give first dose of Lopressor now. 2. Pulm- On room air. Mucinex for cough. Encourage incentive spirometer. 3. Volume overload-on Lasix 40 mg daily.  4. Expected  post operative blood loss anemia-Last H and H  8.9 and 27.1 5. Anxiety-Xanax PRN. At patient request, will give a few Xanax to take PRN but no refills. He was instructed to follow up with his medical doctor. 6. Remove sutures 7. As discussed with Dr. Roxan Hockey, will discharge  Elijah Knapp 06/10/2017,8:04 AM

## 2017-06-10 NOTE — Care Management Note (Signed)
Case Management Note Marvetta Gibbons RN, BSN Unit 4E-Case Manager [phone]  Patient Details  Name: Elijah Knapp MRN: [id number] Date of Birth: [birth year]  Subjective/Objective:   Pt admitted s/p CABGx3                 Action/Plan:  PTA pt lived at home alone, per cardiac rehab note pt needs RW for home- will need DME order placed prior to discharge for RW- CM will assist with DME once order placed- pt also does not plan on having anyone stay with him at discharge- has daughter and Sig.other- has been educated by Cardiac rehab of there importance of having supervision arranged for discharge-pt would not qualify for skilled placement- insurance would not cover pt is  ambulating 300 ft-   CM to follow for transition of care needs  Expected Discharge Date:  06/10/17               Expected Discharge Plan:  Home/Self Care  In-House Referral:  NA  Discharge planning Services  CM Consult  Post Acute Care Choice:  Durable Medical Equipment Choice offered to:  Patient  DME Arranged:  Gilford Rile rolling DME Agency:  [place]:    [facility] Agency:     Status of Service:  Completed, signed off  If discussed at [place] of Stay Meetings, dates discussed:    Discharge Disposition: home/self care   Additional Comments:  06/10/17- 1020- Heston Widener RN, CM- pt for discharge today home- call made to patients room to check on RW for home- per conversation with pt via TC- pt states he is not using on now and does not feel like he needs one for home at this time- he does report that he has a cane at home if needed. No DME needs for discharge.   Dawayne Patricia, RN 06/10/2017, 10:27 AM

## 2017-06-10 NOTE — Progress Notes (Signed)
Patient is ready for discharge., He has all of his belongings. Patient is alert and oriented and has all of his questions regarding his discharge answered. Patient will be transported home by his friend Lelon Frohlich. Patient will leave unit in wheelchair.Patient IV has been DC'd without complication and catheter intact. Patient has been taken off of telemetry.

## 2017-06-10 NOTE — Progress Notes (Signed)
Removed chest tube sutures per provider order. Patient tolerated well. Area is clean dry and intact. Patient is resting comfortably

## 2017-06-11 ENCOUNTER — Telehealth (HOSPITAL_COMMUNITY): Payer: Self-pay

## 2017-06-11 NOTE — Telephone Encounter (Signed)
Referral received. Called and spoke with patient in regards to Cardiac Rehab. Patient is a [place] patient and he stated he is not mentally or physically ready to make a decision. Patient stated he has anxiety and needs to get some things in place before making a decision. I explained to patient that when and if he is ready to begin CR he would need to give the [place] a call to get authorization. Patient stated he understands. Closed referral.

## 2017-06-13 ENCOUNTER — Ambulatory Visit: Payer: Managed Care, Other (non HMO) | Admitting: Cardiovascular Disease

## 2017-06-18 ENCOUNTER — Telehealth: Payer: Self-pay

## 2017-06-18 NOTE — Telephone Encounter (Signed)
Mr. Sivley called this morning concerned about medication that [facility] wanted [facility] on after discharge from the [facility].  Patient stated that he is unsure whether he should be starting cholesterol medication.  Patient stated that he wanted to get a second opinion.  I explained to him that I could not see his labs done by the [place] and that he would need to get that information before concerns addressed.  I told him that he does have an appointment with his Cardiologist next week and that he could discuss those results with them and determine whether or not he would need to start a new medication for cholesterol.  Patient acknowledged receipt.

## 2017-06-26 ENCOUNTER — Ambulatory Visit (INDEPENDENT_AMBULATORY_CARE_PROVIDER_SITE_OTHER): Payer: 59 | Admitting: Cardiology

## 2017-06-26 ENCOUNTER — Encounter: Payer: Self-pay | Admitting: Cardiology

## 2017-06-26 VITALS — BP 122/60 | HR 71 | Ht 67.0 in | Wt 157.0 lb

## 2017-06-26 DIAGNOSIS — Z951 Presence of aortocoronary bypass graft: Secondary | ICD-10-CM

## 2017-06-26 DIAGNOSIS — F419 Anxiety disorder, unspecified: Secondary | ICD-10-CM | POA: Insufficient documentation

## 2017-06-26 DIAGNOSIS — I10 Essential (primary) hypertension: Secondary | ICD-10-CM

## 2017-06-26 DIAGNOSIS — E785 Hyperlipidemia, unspecified: Secondary | ICD-10-CM

## 2017-06-26 DIAGNOSIS — I2510 Atherosclerotic heart disease of native coronary artery without angina pectoris: Secondary | ICD-10-CM

## 2017-06-26 MED ORDER — ATORVASTATIN CALCIUM 80 MG PO TABS: 80.0000 mg | ORAL_TABLET | Freq: Every day | ORAL | 6 refills | Status: DC

## 2017-06-26 NOTE — Patient Instructions (Addendum)
Medication Instructions:  START Lipitor 80mg  take 1 tablet once a day  Labwork: Your physician recommends that you return for lab work in: 3 months FASTING LIPIDS, CMET   Testing/Procedures: None   Follow-Up: Your physician wants you to follow-up in: 6 months with Dr Gwenlyn Found. You will receive a reminder letter in the mail two months in advance. If you don't receive a letter, please call our office to schedule the follow-up appointment.  Any Other Special Instructions Will Be Listed Below (If Applicable).  If you need a refill on your cardiac medications before your next appointment, please call your pharmacy.

## 2017-06-26 NOTE — Assessment & Plan Note (Signed)
Controlled.  

## 2017-06-26 NOTE — Assessment & Plan Note (Signed)
S/P CABG x 3 06/04/17- LIMA-LAD, SVG-Dx, SVG-OM

## 2017-06-26 NOTE — Assessment & Plan Note (Signed)
Pt says his LDL was 124 in Oct 2018- Lipitor 80 mg added today, check f/u CMET and lipids in 3 months.

## 2017-06-26 NOTE — Progress Notes (Signed)
06/26/2017 Elijah Knapp   06-14-1955  [id number]  Primary Physician Clinic, [facility] Primary Cardiologist: Dr Gwenlyn Found  HPI:  62 y/o veteran followed by the [place], referred to Dr Gwenlyn Found in March 2019 after the patient had an ED visit with chest pain. Based on his symptoms he set up for OP angiogram. This was done 05/28/17 and revealed high grade CFX, OM, LAD, and Dx disease. His RCA had a 40% narrowing. LVF was normal. He was seen in consul;t by Dr Nils Pyle and underwent CABG x 3 on 06/04/17. His hospital course was complicated by anxiety and he was treated with Xanax. He otherwise had an unremarkable post operative course.  He is seen in the office today for follow up. He says he has had some trouble sleeping and with his appetite but he feels this is getting better. He denies any tachycardia or unusual dyspnea. He asked about more Xanax saying he thinks it has helped him sleep better, I suggested he discuss this with his psychiatrist when he he sees him.    Current Outpatient Medications  Medication Sig Dispense Refill  . ALPRAZolam (XANAX) 0.5 MG tablet Take 1 tablet (0.5 mg total) by mouth 2 (two) times daily as needed for anxiety. 6 tablet 0  . aspirin EC 325 MG EC tablet Take 1 tablet (325 mg total) by mouth daily. 30 tablet 0  . budesonide-formoterol (SYMBICORT) 80-4.5 MCG/ACT inhaler Inhale 2 puffs into the lungs daily.    . Cholecalciferol (VITAMIN D3) 1000 units CAPS Take 1,000 Units by mouth daily.    . ferrous sulfate 325 (65 FE) MG tablet Take 1 tablet (325 mg total) by mouth daily with breakfast. For one month then stop.  3  . guaiFENesin (MUCINEX) 600 MG 12 hr tablet Take 1 tablet (600 mg total) by mouth 2 (two) times daily as needed for cough or to loosen phlegm.    [name] losartan (COZAAR) 100 MG tablet Take 100 mg by mouth daily.    . metoprolol tartrate (LOPRESSOR) 50 MG tablet Take 50 mg by mouth 2 (two) times daily.    . montelukast (SINGULAIR) 10 MG tablet Take 10 mg by  mouth daily as needed.    [name] omeprazole (PRILOSEC) 20 MG capsule Take 1 capsule (20 mg total) by mouth daily.    [name] oxyCODONE (OXY IR/ROXICODONE) 5 MG immediate release tablet Take 5 mg by mouth every 4-6 hours PRN severe pain. 28 tablet 0  . atorvastatin (LIPITOR) 80 MG tablet Take 1 tablet (80 mg total) by mouth daily. 30 tablet 6   No current facility-administered medications for this visit.     No Known Allergies  Past Medical History:  Diagnosis Date  . Arthritis   . Asthma   . Chronic lung disease   . Complication of anesthesia    states he "stopped breathing" during knee surgery.   [name] COPD (chronic obstructive pulmonary disease) ([place])   . Coronary artery disease   . GERD (gastroesophageal reflux disease)   . Hypertension   . Leg cramps   . PTSD (post-traumatic stress disorder)   . Sleep apnea    uses cpap    Social History   Socioeconomic History  . Marital status: Single    Spouse name: Not on file  . Number of children: Not on file  . Years of education: Not on file  . Highest education level: Not on file  Occupational History  . Not on file  Social Needs  .  Financial resource strain: Not on file  . Food insecurity:    Worry: Not on file    Inability: Not on file  . Transportation needs:    Medical: Not on file    Non-medical: Not on file  Tobacco Use  . Smoking status: Never Smoker  . Smokeless tobacco: Never Used  Substance and Sexual Activity  . Alcohol use: Yes    Alcohol/week: 12.6 oz    Types: 7 Glasses of wine, 14 Cans of beer per week  . Drug use: Never  . Sexual activity: Not on file  Lifestyle  . Physical activity:    Days per week: Not on file    Minutes per session: Not on file  . Stress: Not on file  Relationships  . Social connections:    Talks on phone: Not on file    Gets together: Not on file    Attends religious service: Not on file    Active member of club or organization: Not on file    Attends meetings of clubs or  organizations: Not on file    Relationship status: Not on file  . Intimate partner violence:    Fear of current or ex partner: Not on file    Emotionally abused: Not on file    Physically abused: Not on file    Forced sexual activity: Not on file  Other Topics Concern  . Not on file  Social History Narrative  . Not on file     Family History  Problem Relation Age of Onset  . Alzheimer's disease Mother   . Heart disease Father      Review of Systems: General: negative for chills, fever, night sweats or weight changes.  Cardiovascular: negative for chest pain, dyspnea on exertion, edema, orthopnea, palpitations, paroxysmal nocturnal dyspnea or shortness of breath Dermatological: negative for rash Respiratory: negative for cough or wheezing Urologic: negative for hematuria Abdominal: negative for nausea, vomiting, diarrhea, bright red blood per rectum, melena, or hematemesis Neurologic: negative for visual changes, syncope, or dizziness All other systems reviewed and are otherwise negative except as noted above.    Blood pressure 122/60, pulse 71, height 5\' 7"  (1.702 m), weight 157 lb (71.2 kg).  General appearance: alert, cooperative and no distress Neck: no carotid bruit and no JVD Lungs: clear to auscultation bilaterally Heart: regular rate and rhythm Extremities: extremities normal, atraumatic, no cyanosis or edema Skin: Skin color, texture, turgor normal. No rashes or lesions Neurologic: Grossly normal  EKG NSR, inferior TWI  ASSESSMENT AND PLAN:   Hx of CABG S/P CABG x 3 06/04/17- LIMA-LAD, SVG-Dx, SVG-OM  Essential hypertension Controlled  Dyslipidemia, goal LDL below 70 Pt says his LDL was 124 in Oct 2018- Lipitor 80 mg added today, check f/u CMET and lipids in 3 months.   Anxiety He has been referred to psychiatry by his PCP   PLAN  [name] seems to be recovering well post CABG. I added a stain, f/u CMET and lipids in 3 months, f/u Dr Gwenlyn Found in 6  months.   Kerin Ransom PA-C 06/26/2017 11:31 AM

## 2017-06-26 NOTE — Assessment & Plan Note (Signed)
He has been referred to psychiatry by his PCP

## 2017-07-11 ENCOUNTER — Ambulatory Visit
Admission: RE | Admit: 2017-07-11 | Discharge: 2017-07-11 | Disposition: A | Discharge status: Home / Self Care | Payer: 59 | Source: Ambulatory Visit | Attending: Cardiothoracic Surgery | Admitting: Cardiothoracic Surgery

## 2017-07-11 ENCOUNTER — Other Ambulatory Visit: Payer: Self-pay

## 2017-07-11 ENCOUNTER — Other Ambulatory Visit: Payer: Self-pay | Admitting: Cardiothoracic Surgery

## 2017-07-11 ENCOUNTER — Encounter: Payer: Self-pay | Admitting: Cardiothoracic Surgery

## 2017-07-11 ENCOUNTER — Ambulatory Visit (INDEPENDENT_AMBULATORY_CARE_PROVIDER_SITE_OTHER): Payer: Self-pay | Admitting: Cardiothoracic Surgery

## 2017-07-11 VITALS — BP 126/79 | HR 53 | Resp 16 | Ht 67.0 in | Wt 154.0 lb

## 2017-07-11 DIAGNOSIS — Z951 Presence of aortocoronary bypass graft: Secondary | ICD-10-CM

## 2017-07-11 DIAGNOSIS — I2510 Atherosclerotic heart disease of native coronary artery without angina pectoris: Secondary | ICD-10-CM

## 2017-07-11 NOTE — Progress Notes (Signed)
PCP is [facility] Referring Provider is Lorretta Harp, MD  Chief Complaint  Patient presents with  . Routine Post Op    s/p CABG X 3...06/04/17 with a CXR    HPI: 1 month follow-up after urgent CABG x3 for unstable angina.  Patient doing well without recurrent symptoms of angina or heart failure.  Surgical incisions are well-healed.  Patient is anxious to start cardiac rehab.  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  Patient has high blood pressure for which he takes Cozaar and metoprolol.  However his heart rate now is 58 with well-controlled systolic blood pressure so his metoprolol will be reduced to 25 mg twice daily.  He was started on Lipitor at his postop cardiology visit.  Patient knows he can now lift up to 20 pounds and is ready to resume driving and in light activities but no heavy lifting or exertional activity.   Past Medical History:  Diagnosis Date  . Arthritis   . Asthma   . Chronic lung disease   . Complication of anesthesia    states he "stopped breathing" during knee surgery.   [name] COPD (chronic obstructive pulmonary disease) ([place])   . Coronary artery disease   . GERD (gastroesophageal reflux disease)   . Hypertension   . Leg cramps   . PTSD (post-traumatic stress disorder)   . Sleep apnea    uses cpap    Past Surgical History:  Procedure Laterality Date  . CORONARY ARTERY BYPASS GRAFT N/A 06/04/2017   Procedure: CORONARY ARTERY BYPASS GRAFTING (CABG) x 3 WITH ENDOSCOPIC HARVESTING OF RIGHT SAPHENOUS VEIN;  Surgeon: Ivin Poot, MD;  Location: [facility];  Service: Open Heart Surgery;  Laterality: N/A;  . KNEE ARTHROSCOPY W/ MENISCAL REPAIR Right   . KNEE SURGERY Left   . LEFT HEART CATH AND CORONARY ANGIOGRAPHY N/A 05/28/2017   Procedure: LEFT HEART CATH AND CORONARY ANGIOGRAPHY;  Surgeon: Lorretta Harp, MD;  Location: [facility];  Service: Cardiovascular;  Laterality:  N/A;  . TEE WITHOUT CARDIOVERSION N/A 06/04/2017   Procedure: TRANSESOPHAGEAL ECHOCARDIOGRAM (TEE);  Surgeon: Prescott Gum, Collier Salina, MD;  Location: [facility];  Service: Open Heart Surgery;  Laterality: N/A;    Family History  Problem Relation Age of Onset  . Alzheimer's disease Mother   . Heart disease Father     Social History Social History   Tobacco Use  . Smoking status: Never Smoker  . Smokeless tobacco: Never Used  Substance Use Topics  . Alcohol use: Yes    Alcohol/week: 12.6 oz    Types: 7 Glasses of wine, 14 Cans of beer per week  . Drug use: Never    Current Outpatient Medications  Medication Sig Dispense Refill  . ALPRAZolam (XANAX) 0.5 MG tablet Take 1 tablet (0.5 mg total) by mouth 2 (two) times daily as needed for anxiety. 6 tablet 0  . aspirin EC 325 MG EC tablet Take 1 tablet (325 mg total) by mouth daily. 30 tablet 0  . atorvastatin (LIPITOR) 80 MG tablet Take 1 tablet (80 mg total) by mouth daily. 30 tablet 6  . budesonide-formoterol (SYMBICORT) 80-4.5 MCG/ACT inhaler Inhale 2 puffs into the lungs daily.    . Cholecalciferol (VITAMIN D3) 1000 units CAPS Take 1,000 Units by mouth daily.    [name] guaiFENesin (MUCINEX) 600 MG 12 hr tablet Take 1 tablet (600 mg total) by mouth 2 (two) times daily as  needed for cough or to loosen phlegm.    [name] losartan (COZAAR) 100 MG tablet Take 100 mg by mouth daily.    . metoprolol tartrate (LOPRESSOR) 50 MG tablet Take 50 mg by mouth 2 (two) times daily.    . montelukast (SINGULAIR) 10 MG tablet Take 10 mg by mouth daily as needed.    [name] omeprazole (PRILOSEC) 20 MG capsule Take 1 capsule (20 mg total) by mouth daily.    [name] oxyCODONE (OXY IR/ROXICODONE) 5 MG immediate release tablet Take 5 mg by mouth every 4-6 hours PRN severe pain. 28 tablet 0   No current facility-administered medications for this visit.     No Known Allergies  Review of Systems No fever Appetite improved Sleeping well at night No sternal clicking or popping  sensation Some mild soreness in sternal incision   BP 126/79 (BP Location: Right Arm, Patient Position: Sitting, Cuff Size: Normal)   Pulse (!) 53   Resp 16   Ht 5\' 7"  (1.702 m)   Wt 154 lb (69.9 kg)   SpO2 93% Comment: ON RA  BMI 24.12 kg/m  Physical Exam      Exam    General- alert and comfortable    Neck- no JVD, no cervical adenopathy palpable, no carotid bruit   Lungs- clear without rales, wheezes.  Sternum well-healed.   Cor- regular rate and rhythm, no murmur , gallop   Abdomen- soft, non-tender   Extremities - warm, non-tender, minimal edema   Neuro- oriented, appropriate, no focal weakness   Diagnostic Tests: Chest x-ray clear.  Impression: Doing well 1 month following urgent CABG x3. We will refer him for outpatient cardiac rehab. 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  Plan: Return for follow-up in 4 to 6 weeks to discuss return to work and to assess progress.  Len Childs, MD [facility] [phone]

## 2017-07-11 NOTE — Addendum Note (Signed)
Addended by: Charlena Cross F on: 07/11/2017 03:58 PM   Modules accepted: Orders

## 2017-07-23 ENCOUNTER — Telehealth: Payer: Self-pay | Admitting: Cardiovascular Disease

## 2017-07-23 NOTE — Telephone Encounter (Signed)
Returned the call to the patient. He stated that the [facility] needs a letter as to why it took a month to get his cardiac catheretization scheduled in order for payment to be received by the office. The patient would like a call back when the letter has been completed.   The patient stated that he had been in the ED for chest pain in February. He was referred to the office as a new patient. The patient stated that he understands that he was given the first available appointment but the [facility] needs a letter stating the reason for the delay.

## 2017-07-23 NOTE — Telephone Encounter (Signed)
New Message   Patient is calling because the [facility] is needing a letter to explain as to why it took so long to schedule a heart catherization. Please call.

## 2017-07-23 NOTE — Telephone Encounter (Signed)
The first visit with this pt was 3/20 and I cathed him 5 days later. Not sure what they arre talking about. That was pretty quick!!!  JJB

## 2017-07-24 ENCOUNTER — Encounter: Payer: Self-pay | Admitting: *Deleted

## 2017-07-24 NOTE — Telephone Encounter (Signed)
Spoke with pt, he is requesting a letter that says we saw him on 05-23-17 and had the cath 05-28-17. Letter generated and placed at the front desk for patient pick up.

## 2017-08-15 ENCOUNTER — Ambulatory Visit (INDEPENDENT_AMBULATORY_CARE_PROVIDER_SITE_OTHER): Payer: Self-pay | Admitting: Cardiothoracic Surgery

## 2017-08-15 ENCOUNTER — Encounter: Payer: Self-pay | Admitting: Cardiothoracic Surgery

## 2017-08-15 VITALS — BP 122/64 | HR 58 | Resp 20 | Ht 67.0 in | Wt 164.0 lb

## 2017-08-15 DIAGNOSIS — Z951 Presence of aortocoronary bypass graft: Secondary | ICD-10-CM

## 2017-08-15 DIAGNOSIS — I2510 Atherosclerotic heart disease of native coronary artery without angina pectoris: Secondary | ICD-10-CM

## 2017-08-15 MED ORDER — ALPRAZOLAM 0.25 MG PO TABS: 0.5000 mg | ORAL_TABLET | Freq: Every day | ORAL | Status: DC

## 2017-08-15 NOTE — Progress Notes (Signed)
[facility] Referring Provider is Lorretta Harp, MD  Chief Complaint  Patient presents with  . Routine Post Op    1 month f/u    HPI: Patient presents almost 3 months after urgent CABG x3, unstable angina.  Since his last visit he has had some difficulty with anxiety and apparently was placed on Seroquel by the [place] clinic which made him feel even worse.  He has had palpitations, anxiety, musculoskeletal discomfort in the shoulders neck and chest, difficulties with sleeping.  Denies angina.  He walks 1.3 miles every day and feels great after his walk.  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  His last chest x-ray is clear and his exam is unremarkable. He is still waiting to start outpatient cardiac rehab due to the backlog in patients with the limited space in the facility.  Past Medical History:  Diagnosis Date  . Arthritis   . Asthma   . Chronic lung disease   . Complication of anesthesia    states he "stopped breathing" during knee surgery.   [name] COPD (chronic obstructive pulmonary disease) ([place])   . Coronary artery disease   . GERD (gastroesophageal reflux disease)   . Hypertension   . Leg cramps   . PTSD (post-traumatic stress disorder)   . Sleep apnea    uses cpap    Past Surgical History:  Procedure Laterality Date  . CORONARY ARTERY BYPASS GRAFT N/A 06/04/2017   Procedure: CORONARY ARTERY BYPASS GRAFTING (CABG) x 3 WITH ENDOSCOPIC HARVESTING OF RIGHT SAPHENOUS VEIN;  Surgeon: Ivin Poot, MD;  Location: [facility];  Service: Open Heart Surgery;  Laterality: N/A;  . KNEE ARTHROSCOPY W/ MENISCAL REPAIR Right   . KNEE SURGERY Left   . LEFT HEART CATH AND CORONARY ANGIOGRAPHY N/A 05/28/2017   Procedure: LEFT HEART CATH AND CORONARY ANGIOGRAPHY;  Surgeon: Lorretta Harp, MD;  Location: [facility];  Service: Cardiovascular;  Laterality: N/A;  . TEE WITHOUT CARDIOVERSION  N/A 06/04/2017   Procedure: TRANSESOPHAGEAL ECHOCARDIOGRAM (TEE);  Surgeon: Prescott Gum, Collier Salina, MD;  Location: [facility];  Service: Open Heart Surgery;  Laterality: N/A;    Family History  Problem Relation Age of Onset  . Alzheimer's disease Mother   . Heart disease Father     Social History Social History   Tobacco Use  . Smoking status: Never Smoker  . Smokeless tobacco: Never Used  Substance Use Topics  . Alcohol use: Yes    Alcohol/week: 12.6 oz    Types: 7 Glasses of wine, 14 Cans of beer per week  . Drug use: Never    Current Outpatient Medications  Medication Sig Dispense Refill  . ALPRAZolam (XANAX) 0.5 MG tablet Take 1 tablet (0.5 mg total) by mouth 2 (two) times daily as needed for anxiety. 6 tablet 0  . aspirin EC 325 MG EC tablet Take 1 tablet (325 mg total) by mouth daily. 30 tablet 0  . atorvastatin (LIPITOR) 80 MG tablet Take 1 tablet (80 mg total) by mouth daily. 30 tablet 6  . budesonide-formoterol (SYMBICORT) 80-4.5 MCG/ACT inhaler Inhale 2 puffs into the lungs daily.    . Cholecalciferol (VITAMIN D3) 1000 units CAPS Take 1,000 Units by mouth daily.    [name] guaiFENesin (MUCINEX) 600 MG 12 hr tablet Take 1 tablet (600 mg total) by mouth 2 (two) times daily as needed for cough or to loosen phlegm.    [name]  losartan (COZAAR) 100 MG tablet Take 100 mg by mouth daily.    . metoprolol tartrate (LOPRESSOR) 50 MG tablet Take 20 mg by mouth daily.     . montelukast (SINGULAIR) 10 MG tablet Take 10 mg by mouth daily as needed.    [name] omeprazole (PRILOSEC) 20 MG capsule Take 1 capsule (20 mg total) by mouth daily.    [name] oxyCODONE (OXY IR/ROXICODONE) 5 MG immediate release tablet Take 5 mg by mouth every 4-6 hours PRN severe pain. 28 tablet 0   Current Facility-Administered Medications  Medication Dose Route Frequency Provider Last Rate Last Dose  . ALPRAZolam Duanne Moron) tablet 0.5 mg  0.5 mg Oral QHS Prescott Gum, Collier Salina, MD        No Known Allergies  Review of Systems  Weight slowly  improving with improved appetite No fever or productive cough No headache dizziness or syncope  BP 122/64   Pulse (!) 58   Resp 20   Ht 5\' 7"  (1.702 m)   Wt 164 lb (74.4 kg)   SpO2 99% Comment: RA  BMI 25.69 kg/m  Physical Exam      Exam    General- alert and comfortable    Neck- no JVD, no cervical adenopathy palpable, no carotid bruit   Lungs- clear without rales, wheezes   Cor- regular rate and rhythm, no murmur , gallop   Abdomen- soft, non-tender   Extremities - warm, non-tender, minimal edema   Neuro- oriented, appropriate, no focal weakness   Diagnostic Tests: Last chest x-ray is clear-last month  Impression: Patient is recovering well but has had issues with anxiety.  I have reassured him that he he is recovering well and his cardiac status is much improved after surgery. I called in a one-time refill for his Xanax to help him rest at night. He is encouraged to continue his daily walking routine at 1.3 miles and to start cardiac rehab when he is called in.  Plan: I will see him back in 2 months to assess his progress in cardiac rehab.   Len Childs, MD [facility] [phone]

## 2017-08-16 ENCOUNTER — Other Ambulatory Visit: Payer: Self-pay | Admitting: *Deleted

## 2017-08-21 ENCOUNTER — Telehealth (HOSPITAL_COMMUNITY): Payer: Self-pay

## 2017-08-21 NOTE — Telephone Encounter (Signed)
Called and spoke with patient in regards to Cardiac Rehab. Patient stated he should have done the program the first time and is super interested now. He does return to work July 8th and he works in [place]. I explained to patient I would not be able to get him into [facility] until late July. I informed patient there is a Cardiac Rehab Program in [place]. Patient gave me permission to send paperwork. Spoke with Card[facility] Rehab in [place] to inform I was faxing over paperwork. Closed referral.

## 2017-10-17 ENCOUNTER — Ambulatory Visit (INDEPENDENT_AMBULATORY_CARE_PROVIDER_SITE_OTHER): Payer: No Typology Code available for payment source | Admitting: Cardiothoracic Surgery

## 2017-10-17 ENCOUNTER — Encounter: Payer: Self-pay | Admitting: Cardiothoracic Surgery

## 2017-10-17 ENCOUNTER — Other Ambulatory Visit: Payer: Self-pay

## 2017-10-17 VITALS — BP 109/61 | HR 52 | Resp 16 | Ht 67.0 in | Wt 164.0 lb

## 2017-10-17 DIAGNOSIS — Z951 Presence of aortocoronary bypass graft: Secondary | ICD-10-CM | POA: Diagnosis not present

## 2017-10-17 DIAGNOSIS — I2510 Atherosclerotic heart disease of native coronary artery without angina pectoris: Secondary | ICD-10-CM | POA: Diagnosis not present

## 2017-10-17 NOTE — Progress Notes (Signed)
[facility] Referring Provider is Lorretta Harp, MD  Chief Complaint  Patient presents with  . Routine Post Op    2 month f/u, HX of CABG 06/04/17    HPI: Final office visit almost 4 months after urgent CABG x4.  He is doing well.  He is back working as a Scientist, product/process development.  He denies angina.  He still has some numbness on the left side of his chest wall.  No symptoms of heart failure.  His primary care is delivered by the [facility].  He is on aspirin, Lipitor, and Lopressor.  He checks blood pressure regularly.   Past Medical History:  Diagnosis Date  . Arthritis   . Asthma   . Chronic lung disease   . Complication of anesthesia    states he "stopped breathing" during knee surgery.   [name] COPD (chronic obstructive pulmonary disease) ([place])   . Coronary artery disease   . GERD (gastroesophageal reflux disease)   . Hypertension   . Leg cramps   . PTSD (post-traumatic stress disorder)   . Sleep apnea    uses cpap    Past Surgical History:  Procedure Laterality Date  . CORONARY ARTERY BYPASS GRAFT N/A 06/04/2017   Procedure: CORONARY ARTERY BYPASS GRAFTING (CABG) x 3 WITH ENDOSCOPIC HARVESTING OF RIGHT SAPHENOUS VEIN;  Surgeon: Ivin Poot, MD;  Location: [facility];  Service: Open Heart Surgery;  Laterality: N/A;  . KNEE ARTHROSCOPY W/ MENISCAL REPAIR Right   . KNEE SURGERY Left   . LEFT HEART CATH AND CORONARY ANGIOGRAPHY N/A 05/28/2017   Procedure: LEFT HEART CATH AND CORONARY ANGIOGRAPHY;  Surgeon: Lorretta Harp, MD;  Location: [facility];  Service: Cardiovascular;  Laterality: N/A;  . TEE WITHOUT CARDIOVERSION N/A 06/04/2017   Procedure: TRANSESOPHAGEAL ECHOCARDIOGRAM (TEE);  Surgeon: Prescott Gum, Collier Salina, MD;  Location: [facility];  Service: Open Heart Surgery;  Laterality: N/A;    Family History  Problem Relation Age of Onset  . Alzheimer's disease Mother   . Heart disease Father     Social History Social History   Tobacco Use  . Smoking status: Never  Smoker  . Smokeless tobacco: Never Used  Substance Use Topics  . Alcohol use: Yes    Alcohol/week: 21.0 standard drinks    Types: 7 Glasses of wine, 14 Cans of beer per week  . Drug use: Never    Current Outpatient Medications  Medication Sig Dispense Refill  . aspirin EC 325 MG EC tablet Take 1 tablet (325 mg total) by mouth daily. 30 tablet 0  . atorvastatin (LIPITOR) 80 MG tablet Take 1 tablet (80 mg total) by mouth daily. 30 tablet 6  . budesonide-formoterol (SYMBICORT) 80-4.5 MCG/ACT inhaler Inhale 2 puffs into the lungs daily.    . Cholecalciferol (VITAMIN D3) 1000 units CAPS Take 1,000 Units by mouth daily.    [name] guaiFENesin (MUCINEX) 600 MG 12 hr tablet Take 1 tablet (600 mg total) by mouth 2 (two) times daily as needed for cough or to loosen phlegm.    [name] losartan (COZAAR) 100 MG tablet Take 100 mg by mouth daily.    . metoprolol tartrate (LOPRESSOR) 50 MG tablet Take 20 mg by mouth daily.     . montelukast (SINGULAIR) 10 MG tablet Take 10 mg by mouth daily as needed.    [name] omeprazole (PRILOSEC) 20 MG capsule Take 1 capsule (20 mg total) by mouth daily.     No current facility-administered medications for this  visit.     No Known Allergies  Review of Systems  Weight stable No fever  BP 109/61 (BP Location: Right Arm, Patient Position: Sitting, Cuff Size: Normal)   Pulse (!) 52   Resp 16   Ht 5\' 7"  (1.702 m)   Wt 164 lb (74.4 kg)   SpO2 97% Comment: RA  BMI 25.69 kg/m  Physical Exam      Exam    General- alert and comfortable    Neck- no JVD, no cervical adenopathy palpable, no carotid bruit   Lungs- clear without rales, wheezes   Cor- regular rate and rhythm, no murmur , gallop   Abdomen- soft, non-tender   Extremities - warm, non-tender, minimal edema   Neuro- oriented, appropriate, no focal weakness  Diagnostic Tests:   Impression: Excellent recovery after emergency CABG   Plan: Patient understands importance of heart healthy lifestyle and diet and  compliance with his current medications.  Return as needed.  Len Childs, MD [facility] [phone]

## 2018-02-26 ENCOUNTER — Encounter: Payer: Self-pay | Admitting: Cardiovascular Disease

## 2018-02-26 ENCOUNTER — Ambulatory Visit (INDEPENDENT_AMBULATORY_CARE_PROVIDER_SITE_OTHER): Payer: 59 | Admitting: Cardiovascular Disease

## 2018-02-26 VITALS — BP 116/64 | HR 91 | Ht 67.0 in | Wt 160.0 lb

## 2018-02-26 DIAGNOSIS — I10 Essential (primary) hypertension: Secondary | ICD-10-CM

## 2018-02-26 DIAGNOSIS — E785 Hyperlipidemia, unspecified: Secondary | ICD-10-CM

## 2018-02-26 MED ORDER — ATORVASTATIN CALCIUM 40 MG PO TABS: 40.0000 mg | ORAL_TABLET | Freq: Every day | ORAL | 3 refills | Status: DC

## 2018-02-26 NOTE — Assessment & Plan Note (Signed)
History of hyperlipidemia on high-dose statin therapy with some statin related side effects.  I am going to decrease his Lipitor from 80 to 40 mg a day and we will recheck a lipid liver profile in 2 months.

## 2018-02-26 NOTE — Progress Notes (Signed)
02/26/2018 Charlena Cross Billing   Aug 10, 1955  [id number]  Primary Physician [facility] Primary Cardiologist: Lorretta Harp MD Lupe Carney, [place]  HPI:  Elijah Knapp is a 62 y.o.  thin-appearing divorced Caucasian male father of 2 children who currently works as a Scientist, product/process development. He is cared for by the [facility] in [place].  I last saw him in the office 05/23/2017.  His only cardiac risk factors family history father died of myocardial infarction. He's never had a heart attack or stroke.He has had chest pain for last year worse recently now occurring on a daily basis. The pain is worse with exertion and occurred at night as well.   Based on this I performed outpatient cardiac catheterization on him 05/28/2017 revealing severe two-vessel disease.  Several days later on 06/04/2017 he underwent CABG x3 by Dr. Darcey Nora with a LIMA to his LAD, vein to diagonal branch and obtuse marginal branch.  He did complete cardiac rehab and has done well since.   Current Meds  Medication Sig  . aspirin 81 MG tablet Take 81 mg by mouth daily.  [name] atorvastatin (LIPITOR) 80 MG tablet Take 1 tablet (80 mg total) by mouth daily. (Patient taking differently: Take 80 mg by mouth every other day. )  . guaiFENesin (MUCINEX) 600 MG 12 hr tablet Take 1 tablet (600 mg total) by mouth 2 (two) times daily as needed for cough or to loosen phlegm.  [name] losartan (COZAAR) 100 MG tablet Take 100 mg by mouth daily.  . metoprolol tartrate (LOPRESSOR) 50 MG tablet Take 20 mg by mouth daily.   [name] omeprazole (PRILOSEC) 20 MG capsule Take 1 capsule (20 mg total) by mouth daily.  . [DISCONTINUED] montelukast (SINGULAIR) 10 MG tablet Take 10 mg by mouth daily as needed.     No Known Allergies  Social History   Socioeconomic History  . Marital status: Single    Spouse name: Not on file  . Number of children: Not on file  . Years of education: Not on file  . Highest education level: Not on file    Occupational History  . Not on file  Social Needs  . Financial resource strain: Not on file  . Food insecurity:    Worry: Not on file    Inability: Not on file  . Transportation needs:    Medical: Not on file    Non-medical: Not on file  Tobacco Use  . Smoking status: Never Smoker  . Smokeless tobacco: Never Used  Substance and Sexual Activity  . Alcohol use: Yes    Alcohol/week: 21.0 standard drinks    Types: 7 Glasses of wine, 14 Cans of beer per week  . Drug use: Never  . Sexual activity: Not on file  Lifestyle  . Physical activity:    Days per week: Not on file    Minutes per session: Not on file  . Stress: Not on file  Relationships  . Social connections:    Talks on phone: Not on file    Gets together: Not on file    Attends religious service: Not on file    Active member of club or organization: Not on file    Attends meetings of clubs or organizations: Not on file    Relationship status: Not on file  . Intimate partner violence:    Fear of current or ex partner: Not on file    Emotionally abused: Not on file  Physically abused: Not on file    Forced sexual activity: Not on file  Other Topics Concern  . Not on file  Social History Narrative  . Not on file     Review of Systems: General: negative for chills, fever, night sweats or weight changes.  Cardiovascular: negative for chest pain, dyspnea on exertion, edema, orthopnea, palpitations, paroxysmal nocturnal dyspnea or shortness of breath Dermatological: negative for rash Respiratory: negative for cough or wheezing Urologic: negative for hematuria Abdominal: negative for nausea, vomiting, diarrhea, bright red blood per rectum, melena, or hematemesis Neurologic: negative for visual changes, syncope, or dizziness All other systems reviewed and are otherwise negative except as noted above.    Blood pressure 116/64, pulse 91, height 5\' 7"  (1.702 m), weight 160 lb (72.6 kg).  General appearance: alert  and no distress Neck: no adenopathy, no carotid bruit, no JVD, supple, symmetrical, trachea midline and thyroid not enlarged, symmetric, no tenderness/mass/nodules Lungs: clear to auscultation bilaterally Heart: regular rate and rhythm, S1, S2 normal, no murmur, click, rub or gallop Extremities: extremities normal, atraumatic, no cyanosis or edema Pulses: 2+ and symmetric Skin: Skin color, texture, turgor normal. No rashes or lesions Neurologic: Alert and oriented X 3, normal strength and tone. Normal symmetric reflexes. Normal coordination and gait  EKG sinus rhythm at 91 without ST or T wave changes.  Personally reviewed this EKG.  ASSESSMENT AND PLAN:   Hx of CABG History of CAD status post CABG x3 by Dr. Darcey Nora 06/04/2016 with a LIMA to the LAD, vein to an obtuse marginal branch and diagonal branch.  This was done after cardiac catheterization which I performed 05/28/2017 showed severe two-vessel disease.  He did complete cardiac rehab successfully.  He denies chest pain or shortness of breath.  Dyslipidemia, goal LDL below 70 History of hyperlipidemia on high-dose statin therapy with some statin related side effects.  I am going to decrease his Lipitor from 80 to 40 mg a day and we will recheck a lipid liver profile in 2 months.  Essential hypertension History of essential hypertension blood pressure measured today at 116/64.  He is on metoprolol and losartan.      Lorretta Harp MD FACP,FACC,FAHA, [facility] 02/26/2018 11:13 AM

## 2018-02-26 NOTE — Assessment & Plan Note (Signed)
History of essential hypertension blood pressure measured today at 116/64.  He is on metoprolol and losartan.

## 2018-02-26 NOTE — Assessment & Plan Note (Signed)
History of CAD status post CABG x3 by Dr. Darcey Nora 06/04/2016 with a LIMA to the LAD, vein to an obtuse marginal branch and diagonal branch.  This was done after cardiac catheterization which I performed 05/28/2017 showed severe two-vessel disease.  He did complete cardiac rehab successfully.  He denies chest pain or shortness of breath.

## 2018-02-26 NOTE — Patient Instructions (Signed)
Medication Instructions:  Your physician has recommended you make the following change in your medication:   [place] TO 40 MG BY MOUTH DAILY  If you need a refill on your cardiac medications before your next appointment, please call your pharmacy.   Lab work: Your physician recommends that you return for lab work in 2 MONTHS: LIPID/LIVER  If you have labs (blood work) drawn today and your tests are completely normal, you will receive your results only by: [name] MyChart Message (if you have MyChart) OR . A paper copy in the mail If you have any lab test that is abnormal or we need to change your treatment, we will call you to review the results.  Testing/Procedures: NONE  Follow-Up: At [facility], you and your health needs are our priority.  As part of our continuing mission to provide you with exceptional heart care, we have created designated Provider Care Teams.  These Care Teams include your primary Cardiologist (physician) and Advanced Practice Providers (APPs -  Physician Assistants and Nurse Practitioners) who all work together to provide you with the care you need, when you need it. You will need a follow up appointment in 12 months.  Please call our office 2 months in advance to schedule this appointment.  You may see DR. BERRY or one of the following Advanced Practice Providers on your designated Care Team:   Kerin Ransom, PA-C [place], [place] . Sande Rives, PA-C

## 2018-11-05 IMAGING — DX DG CHEST 1V PORT
1 series · 1 of 1 positions shown · non-contrast
Comparison: 06/05/2017

CLINICAL DATA: Chest tube in place

EXAM:
PORTABLE CHEST 1 VIEW

[chest ap]
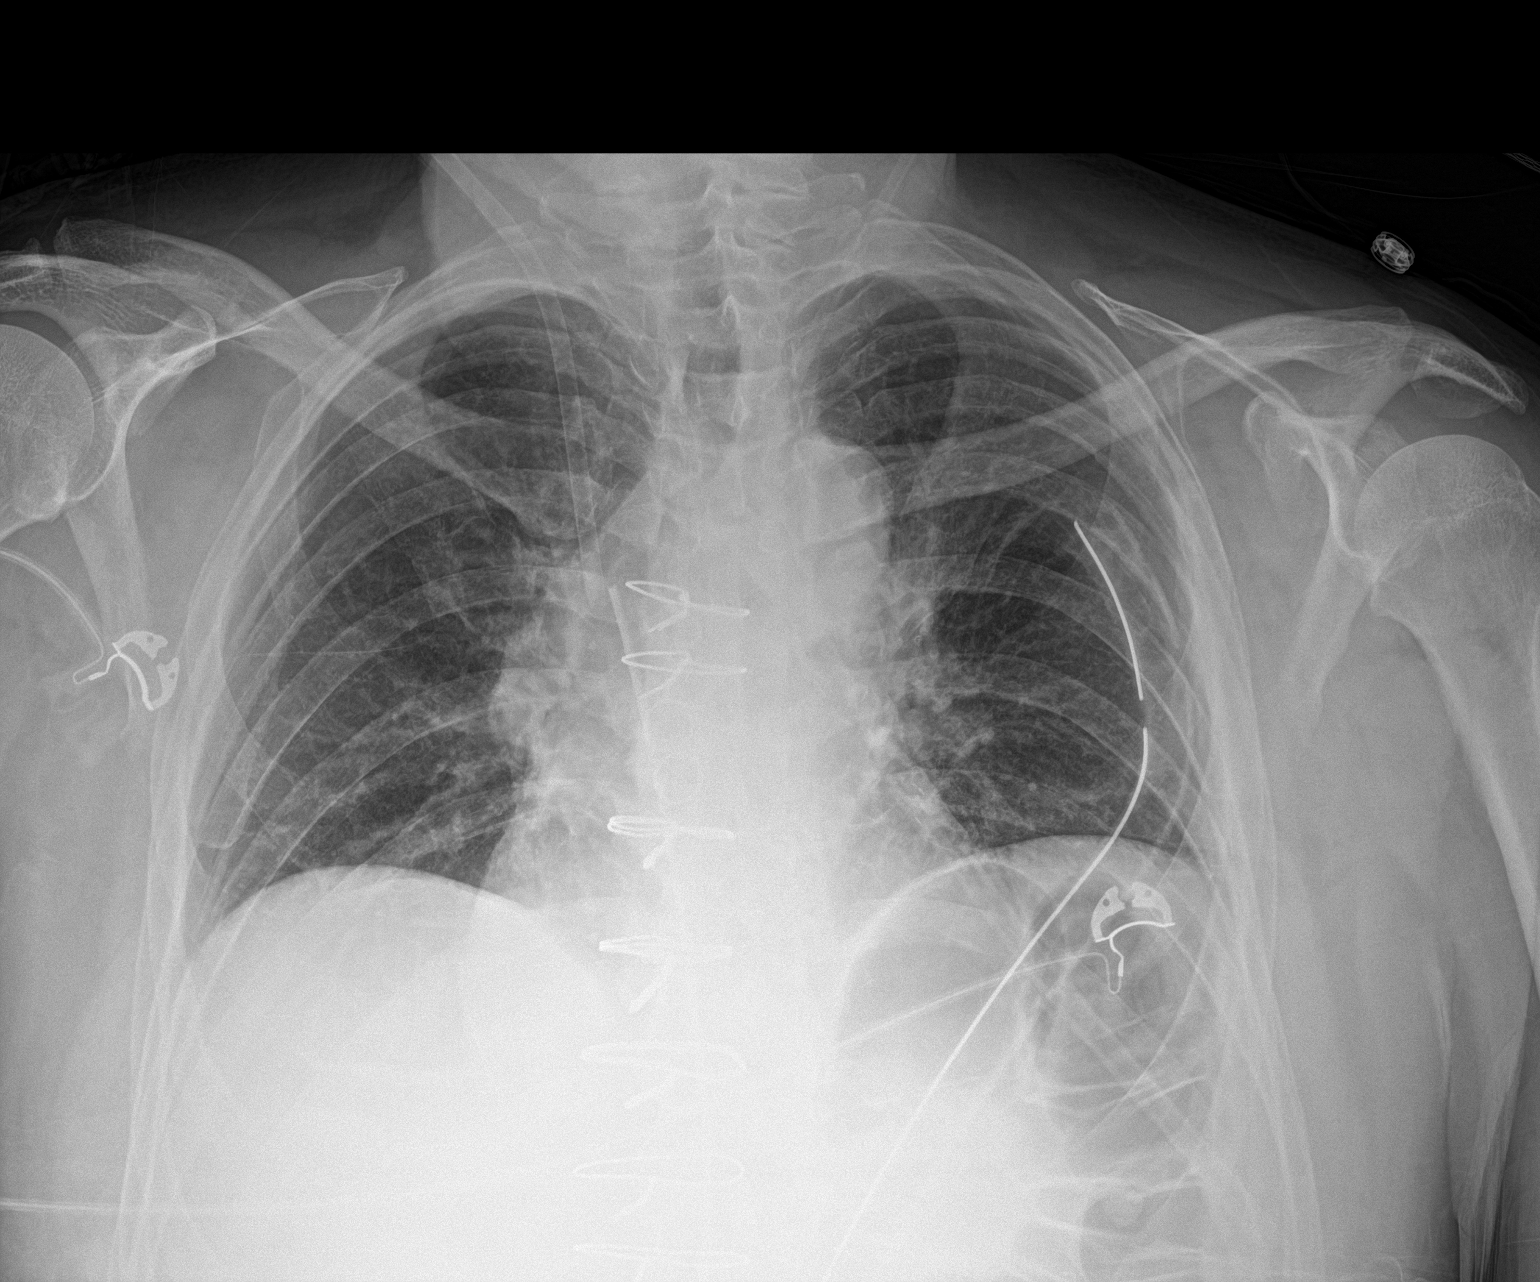

[1 of 1 positions shown; findings below may reference images not displayed]

FINDINGS: Swan-Ganz catheter is been removed with the introducer left in
place. Chest and mediastinal tubes are stable. Normal heart size.
Low lung volumes. Normal vascularity. No pneumothorax. No sign of
pulmonary edema.
IMPRESSION: Swan-Ganz catheter removed.

No sign of CHF or pneumothorax.

## 2018-11-06 IMAGING — CR DG CHEST 2V
2 series · 2 of 2 positions shown · non-contrast
Comparison: Chest x-ray from yesterday

CLINICAL DATA: Postop.  Pneumothorax.

EXAM:
CHEST - 2 VIEW

[chest pa]
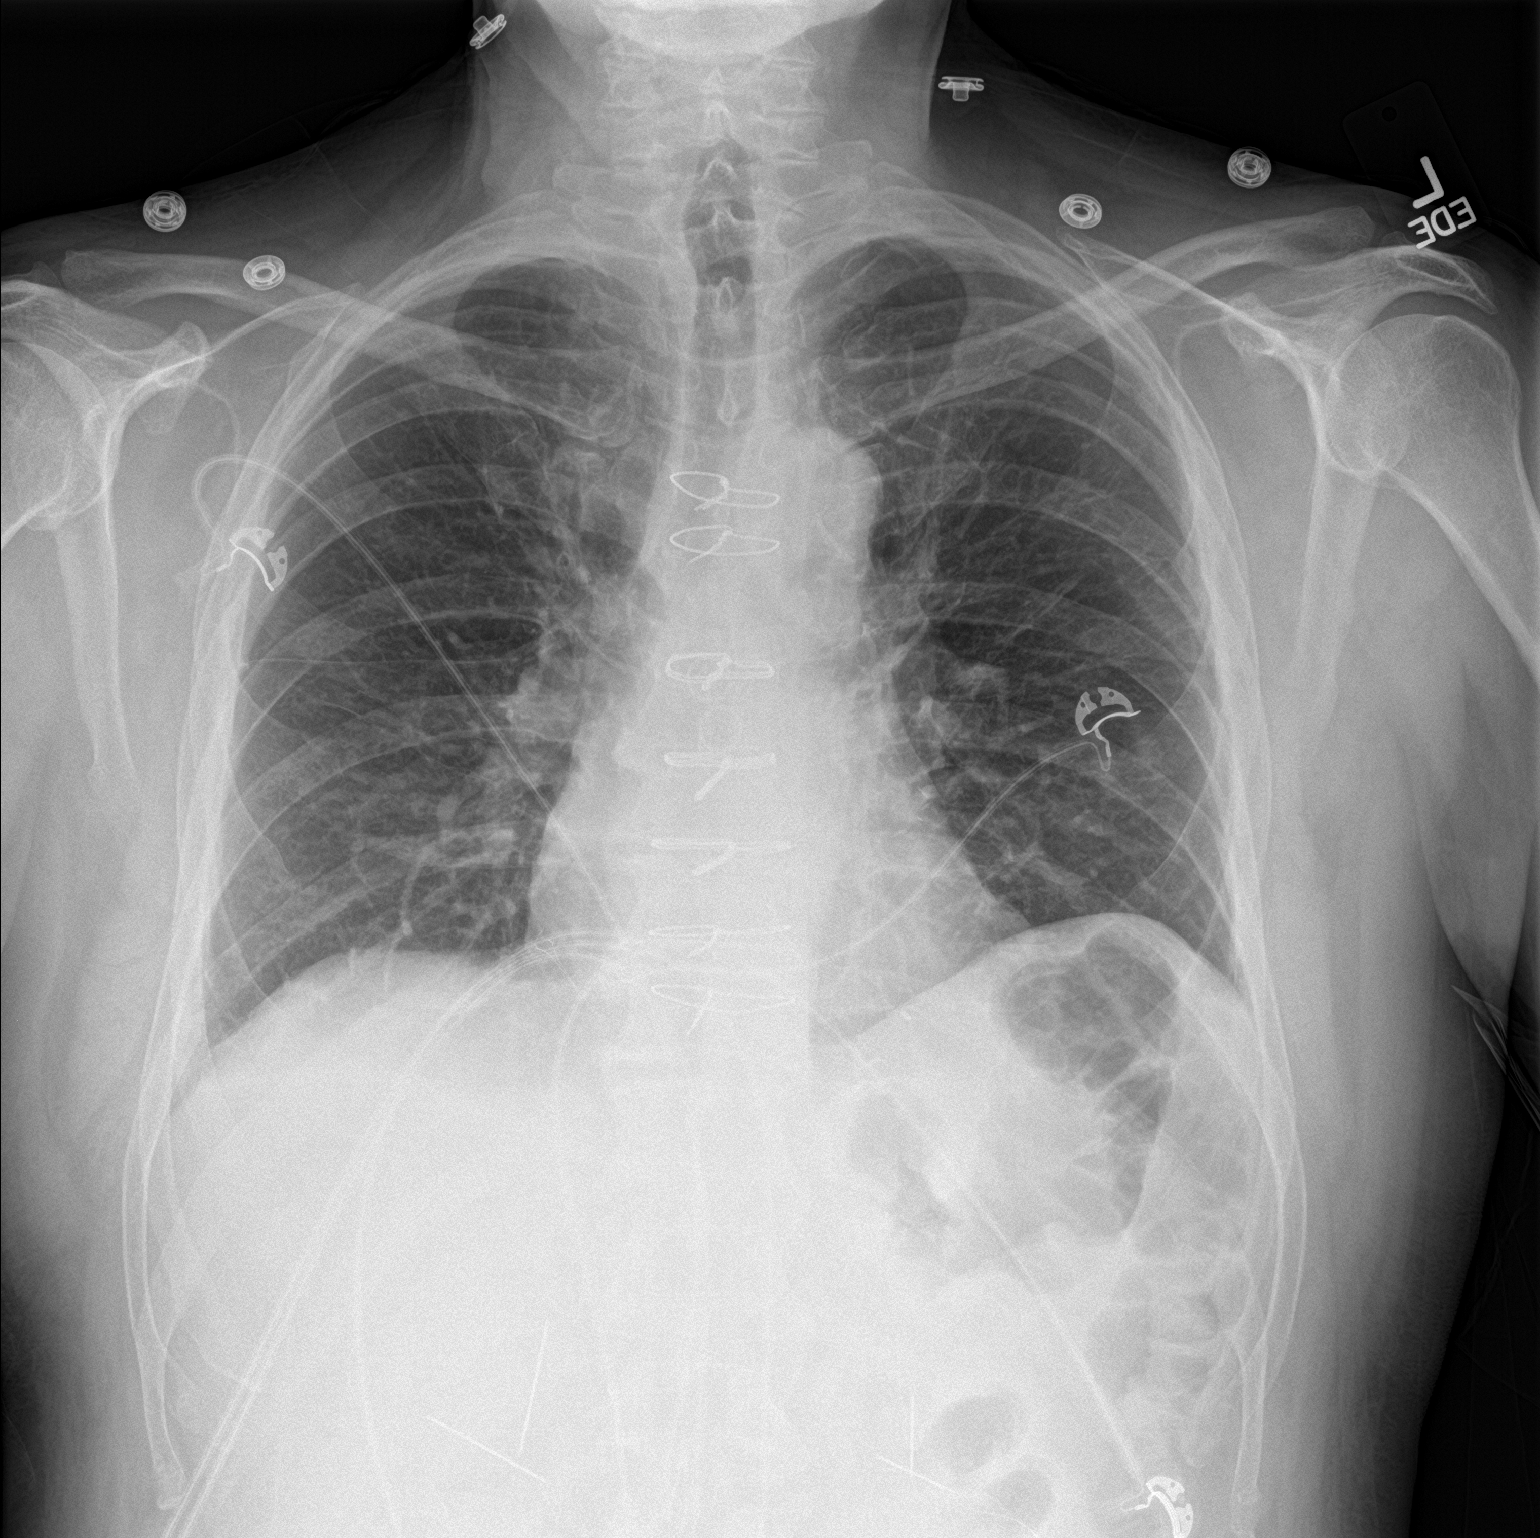

[chest lat]
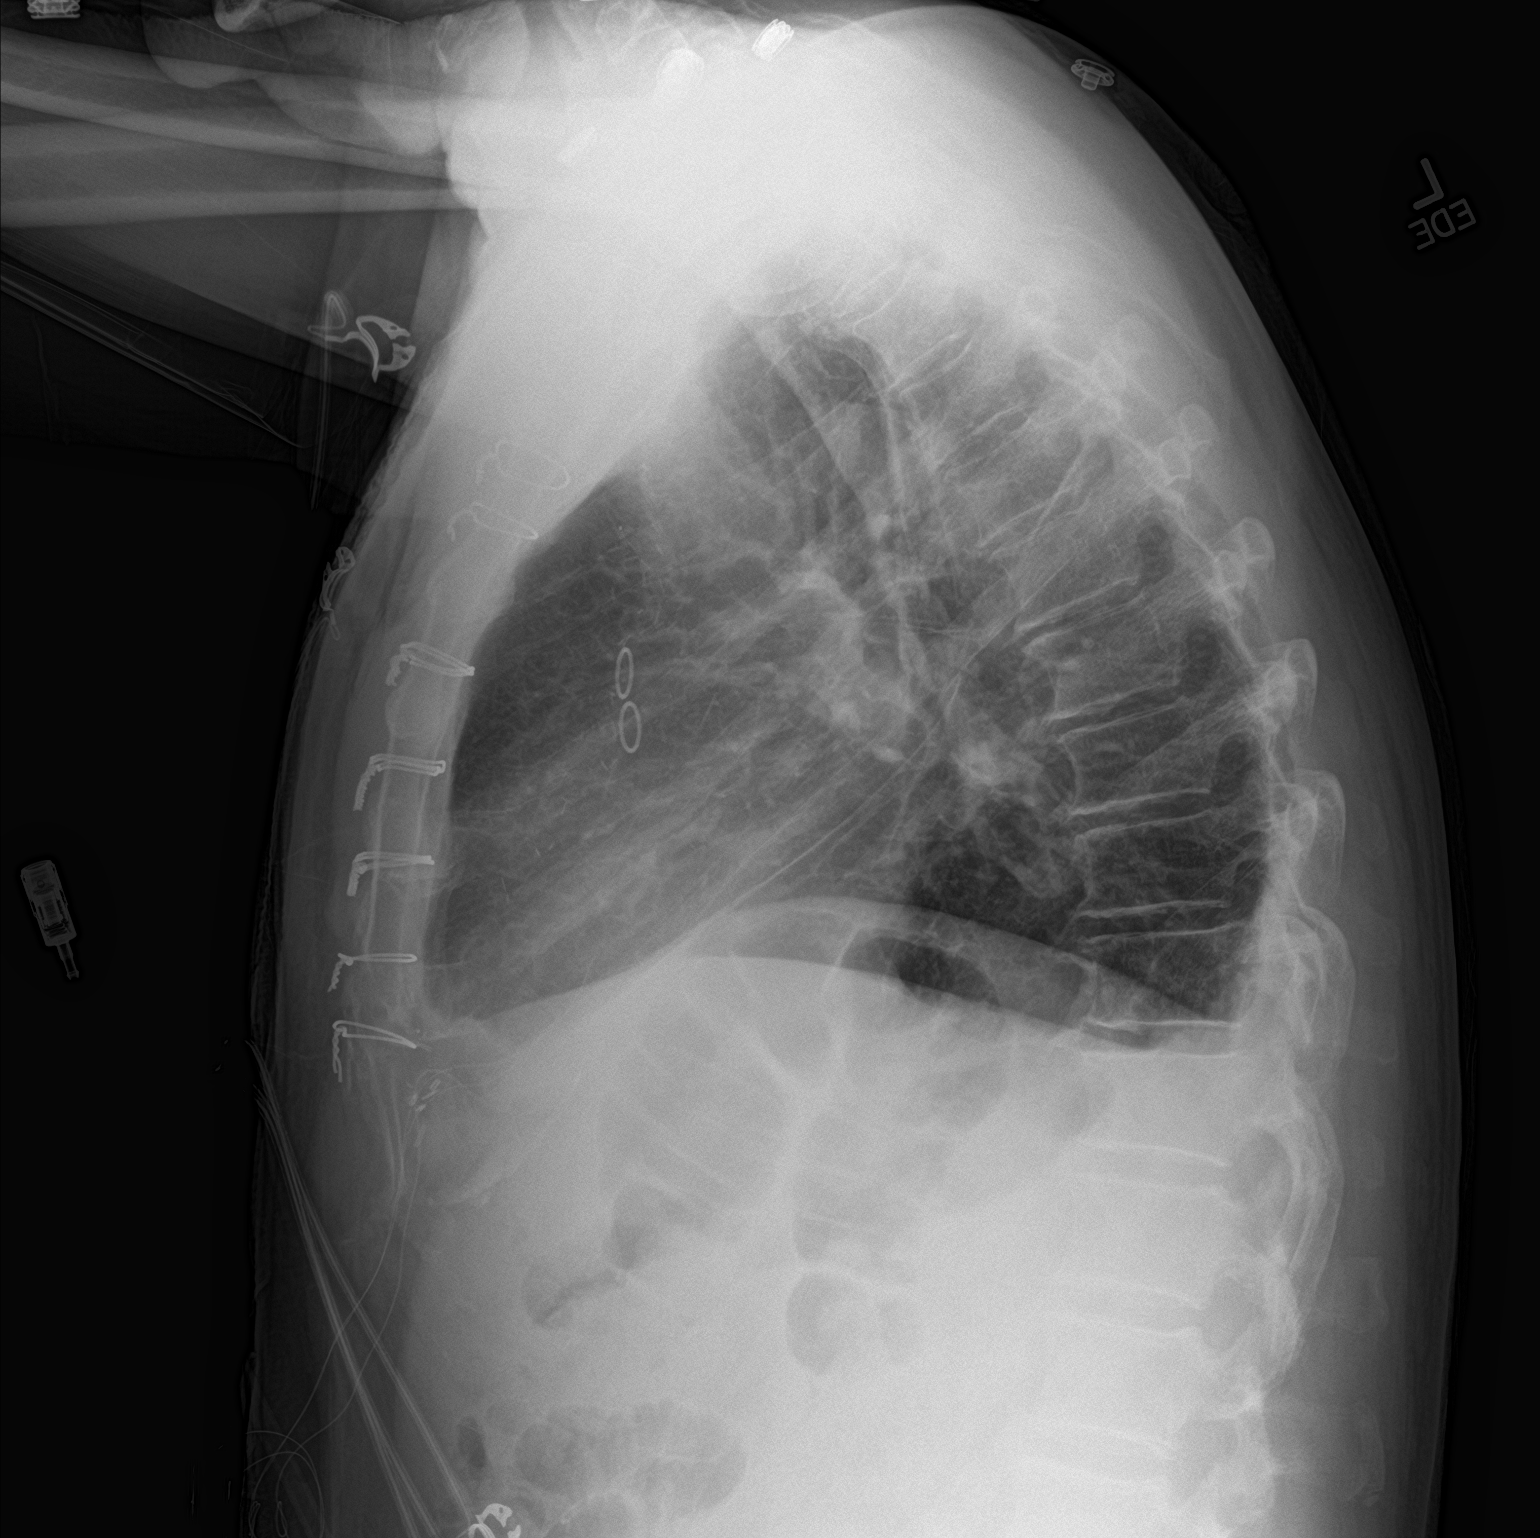

[2 of 2 positions shown; findings below may reference images not displayed]

FINDINGS: Status post CABG. IJ sheath and thoracic drains have been removed.
No visible pneumothorax or pneumomediastinum. Trace pleural
effusions.
IMPRESSION: 1. No pneumothorax after thoracic drain removal.
2. Trace pleural fluid.

## 2019-03-11 ENCOUNTER — Other Ambulatory Visit: Payer: Self-pay

## 2019-03-11 ENCOUNTER — Ambulatory Visit: Payer: No Typology Code available for payment source | Attending: Otolaryngology | Admitting: Physical Therapy

## 2019-03-11 ENCOUNTER — Encounter: Payer: Self-pay | Admitting: Physical Therapy

## 2019-03-11 VITALS — BP 118/80 | HR 71

## 2019-03-11 DIAGNOSIS — R42 Dizziness and giddiness: Secondary | ICD-10-CM | POA: Insufficient documentation

## 2019-03-11 DIAGNOSIS — R2681 Unsteadiness on feet: Secondary | ICD-10-CM | POA: Diagnosis present

## 2019-03-11 DIAGNOSIS — R2689 Other abnormalities of gait and mobility: Secondary | ICD-10-CM | POA: Diagnosis present

## 2019-03-12 NOTE — Therapy (Addendum)
[facility] [address] [place], [place], [address] Phone: [phone]   Fax:  [phone]  Physical Therapy Evaluation  Patient Details  Name: Elijah Knapp MRN: [id number] Date of Birth: [birth year] Referring Provider (PT): Dr. Karna Dupes   Encounter Date: 03/11/2019  PT End of Session - 03/12/19 1542    Visit Number  1    Number of Visits  9   VA has approved 15 visits til 06-07-19   Date for PT Re-Evaluation  05/09/19    Authorization Type  VA    Authorization Time Period  02-07-19 - 06-07-19:  Referral # [id number]    Authorization - Visit Number  1    Authorization - Number of Visits  15    PT Start Time  1102    PT Stop Time  1150    PT Time Calculation (min)  48 min    Activity Tolerance  Patient tolerated treatment well    Behavior During Therapy  [facility] for tasks assessed/performed       Past Medical History:  Diagnosis Date  . Arthritis   . Asthma   . Chronic lung disease   . Complication of anesthesia    states he "stopped breathing" during knee surgery.   [name] COPD (chronic obstructive pulmonary disease) ([employer])   . Coronary artery disease   . GERD (gastroesophageal reflux disease)   . Hypertension   . Leg cramps   . PTSD (post-traumatic stress disorder)   . Sleep apnea    uses cpap    Past Surgical History:  Procedure Laterality Date  . CORONARY ARTERY BYPASS GRAFT N/A 06/04/2017   Procedure: CORONARY ARTERY BYPASS GRAFTING (CABG) x 3 WITH ENDOSCOPIC HARVESTING OF RIGHT SAPHENOUS VEIN;  Surgeon: Ivin Poot, MD;  Location: [facility];  Service: Open Heart Surgery;  Laterality: N/A;  . KNEE ARTHROSCOPY W/ MENISCAL REPAIR Right   . KNEE SURGERY Left   . LEFT HEART CATH AND CORONARY ANGIOGRAPHY N/A 05/28/2017   Procedure: LEFT HEART CATH AND CORONARY ANGIOGRAPHY;  Surgeon: Lorretta Harp, MD;  Location: [place];  Service: Cardiovascular;  Laterality: N/A;  . TEE WITHOUT CARDIOVERSION N/A  06/04/2017   Procedure: TRANSESOPHAGEAL ECHOCARDIOGRAM (TEE);  Surgeon: Prescott Gum, Collier Salina, MD;  Location: [facility];  Service: Open Heart Surgery;  Laterality: N/A;    Vitals:   03/11/19 1121 03/11/19 1122  BP: 106/78 118/80  Pulse: 68 71     Subjective Assessment - 03/11/19 1041    Subjective  Pt states he woke up from a nap (in August 2019) with nausea and vomiting and severe vertigo; pt states his motor skills "have kind of taking it over"; pt states he didn't drive for about a week but had to drive himself to the MD as he lives alone - states he was prescribed something for motion sickness but it didn't help    Pertinent History  Panic disorder    Patient Stated Goals  resolve the vertigo    Currently in Pain?  Other (Comment)   pt reports he would have throbbing/numbness in his brain if he turns too quickly        [facility] PT Assessment - 03/12/19 0001      Assessment   Medical Diagnosis  Vertigo    Referring Provider (PT)  Dr. Karna Dupes    Onset Date/Surgical Date  --   August 2019   Next MD Visit  pt states he is scheduled to  return to him in a few months     Prior Therapy  none      Precautions   Precautions  Other (comment)   vertigo     Balance Screen   Has the patient fallen in the past 6 months  Yes    How many times?  3-4 times in past 6 months    Has the patient had a decrease in activity level because of a fear of falling?   No    Is the patient reluctant to leave their home because of a fear of falling?   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 Assessment - 03/12/19 0001      Vestibular Assessment   General Observation  pt is a 64 yr old gentleman with c/o vertigo that started in August 2019      Symptom Behavior   Type of Dizziness   Imbalance;Spinning;Unsteady with head/body turns;Lightheadedness;"Funny  feeling in head";Vertigo    Duration of Dizziness  seconds now; pt states when it first happened it lasted weeks    Symptom Nature  Motion provoked;Intermittent    Aggravating Factors  Turning body quickly;Turning head quickly;Sit to stand;Looking up to the ceiling;Mornings;Sitting with head tilted back;Driving;Rolling to right;Forward bending    Relieving Factors  Rest;Comments;Closing eyes;Slow movements   looking down helps   Progression of Symptoms  Better      Oculomotor Exam   Oculomotor Alignment  Normal    Ocular ROM  normal    Spontaneous  Absent    Smooth Pursuits  Intact    Saccades  Intact      Oculomotor Exam-Fixation Suppressed    Ocular Alignment  normal    Ocular ROM  normal      Positional Testing   Dix-Hallpike  Dix-Hallpike Right;Dix-Hallpike Left    Sidelying Test  Sidelying Right;Sidelying Left      Dix-Hallpike Right   Dix-Hallpike Right Duration  none    Dix-Hallpike Right Symptoms  No nystagmus      Dix-Hallpike Left   Dix-Hallpike Left Duration  none    Dix-Hallpike Left Symptoms  No nystagmus      Sidelying Right   Sidelying Right Duration  none    Sidelying Right Symptoms  No nystagmus      Sidelying Left   Sidelying Left Duration  none    Sidelying Left Symptoms  No nystagmus          Objective measurements completed on examination: See above findings.      Romberg EO 30 secs Romberg EC 30 secs Sharpened Romberg EO 30 secs Sharpened Romberg EC - pt unable to perform due to LOB upon attempt         PT Education - 03/12/19 1540    Education Details  pt instructed to continue with walking program on regular basis and increase water intake for good hydration    Person(s) Educated  Patient    Methods  Explanation    Comprehension  Verbalized understanding       PT Short Term Goals - 03/12/19 1553      PT SHORT TERM GOAL #1   Title  Pt will improve DHI by at least 10 points to indicate improvement in dizziness and less  disability due to vertigo.    Baseline  To be completed next session    Time  4    Period  Weeks    Status  New    Target Date  04/11/19      PT SHORT TERM GOAL #2   Title  Pt will subjectively report at least 25% improvement in vertigo.    Time  4    Period  Weeks    Status  New    Target Date  04/11/19      PT SHORT TERM GOAL #3   Title  Pt will amb. 30' with horizontal head turns without LOB with c/o dizziness </= 3/10 intensity.    Time  4    Period  Weeks    Status  New    Target Date  04/11/19      PT SHORT TERM GOAL #4   Title  Independent in HEP for balance and vestibular exercises.    Time  4    Period  Weeks    Status  New    Target Date  04/11/19        PT Long Term Goals - 03/12/19 1558      PT LONG TERM GOAL #1   Title  Pt will improve DHI score by at least 20 points to indicate improvement in vertigo and less disability due to dizziness.    Time  8    Period  Weeks    Status  New    Target Date  05/09/19      PT LONG TERM GOAL #2   Title  Pt will improve SOT composite score by at least 20 points to indicate improved balance and vestibular input.    Time  8    Period  Weeks    Status  New    Target Date  05/09/19      PT LONG TERM GOAL #3   Title  Pt will have </= 2 line difference for DVA for improved VOR and gaze stabilization.    Time  8    Period  Weeks    Status  New    Target Date  05/09/19      PT LONG TERM GOAL #4   Title  Pt will report at least 50% improvement in vertigo.    Time  8    Period  Weeks    Status  New    Target Date  05/09/19      PT LONG TERM GOAL #5   Title  Independent in updated HEP for balance and vestibular exercises.    Time  8    Period  Weeks    Status  New    Target Date  05/09/19             Plan - 03/12/19 1543    Clinical Impression Statement  Pt presents with no signs or symptoms of BPPV as per the referring diagnosis.  No nystagmus noted with any positional testing and no c/o spinning  vertigo reported with any positional testing.  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  Pt does have some unsteadiness with standing with EC and has c/o discomfort in his head/brain with certain movements.  Pt will benefit from skilled PT to address vestibular and balance deficits and c/o dizziness.    Personal Factors and Comorbidities  Behavior Pattern;Past/Current Experience;Comorbidity 1;Time since onset of injury/illness/exacerbation  Comorbidities  panic disorder, acute recurrent sinusitis, HTN, asthma    Examination-Activity Limitations  Transfers;Locomotion Level;Bend;Stand;Squat    Examination-Participation Restrictions  Community Activity;Driving;Shop;Yard Work;Cleaning;Other   carpentry projects - building a Scientist, research (medical)  Evolving/Moderate complexity    Clinical Decision Making  Moderate    Rehab Potential  Good    PT Frequency  1x / week    PT Duration  8 weeks    PT Treatment/Interventions  ADLs/Self Care Home Management;Patient/family education;Vestibular;Therapeutic exercise;Therapeutic activities;Balance training;Neuromuscular re-education;Gait training;Stair training    PT Next Visit Plan  do SOT - issue balance on foam exercises as appropriate based on SOT score; give x1 viewing exercise - can start seated position    Consulted and Agree with Plan of Care  Patient       Patient will benefit from skilled therapeutic intervention in order to improve the following deficits and impairments:  Dizziness, Decreased balance  Visit Diagnosis: Dizziness and giddiness - Plan: PT plan of care cert/re-cert  Other abnormalities of gait and mobility - Plan: PT plan of care cert/re-cert  Unsteadiness on feet - Plan: PT plan of care cert/re-cert     Problem List Patient Active Problem List   Diagnosis Date Noted  . Hx of CABG 06/26/2017  . Dyslipidemia, goal LDL below 70  06/26/2017  . Anxiety 06/26/2017  . Essential hypertension 06/26/2017  . Coronary artery disease 06/04/2017  . Chest pain 05/23/2017    Alda Lea, PT 03/12/2019, 4:11 PM  [facility] [address] [place], [place], [address] Phone: [phone]   Fax:  [phone]  Name: SHAHID AMBURN MRN: [id number] Date of Birth: 08/25/1955

## 2019-03-18 ENCOUNTER — Ambulatory Visit: Payer: No Typology Code available for payment source | Admitting: Physical Therapy

## 2019-03-18 ENCOUNTER — Other Ambulatory Visit: Payer: Self-pay

## 2019-03-18 DIAGNOSIS — R2681 Unsteadiness on feet: Secondary | ICD-10-CM

## 2019-03-18 DIAGNOSIS — R42 Dizziness and giddiness: Secondary | ICD-10-CM | POA: Diagnosis not present

## 2019-03-19 ENCOUNTER — Encounter: Payer: Self-pay | Admitting: Physical Therapy

## 2019-03-19 NOTE — Therapy (Signed)
[facility] [address] [place], [place], [address] Phone: [phone]   Fax:  [phone]  Physical Therapy Treatment  Patient Details  Name: Elijah Knapp MRN: [id number] Date of Birth: 1955/03/27 Referring Provider (PT): Dr. Karna Dupes   Encounter Date: 03/18/2019  PT End of Session - 03/19/19 2055    Visit Number  2    Number of Visits  9   VA has approved 15 visits til 06-07-19   Date for PT Re-Evaluation  05/09/19    Authorization Type  VA    Authorization Time Period  02-07-19 - 06-07-19:  Referral # [id number]    Authorization - Visit Number  2    Authorization - Number of Visits  15    PT Start Time  1016    PT Stop Time  1100    PT Time Calculation (min)  44 min    Activity Tolerance  Patient tolerated treatment well    Behavior During Therapy  [facility] for tasks assessed/performed       Past Medical History:  Diagnosis Date  . Arthritis   . Asthma   . Chronic lung disease   . Complication of anesthesia    states he "stopped breathing" during knee surgery.   [name] COPD (chronic obstructive pulmonary disease) ([facility])   . Coronary artery disease   . GERD (gastroesophageal reflux disease)   . Hypertension   . Leg cramps   . PTSD (post-traumatic stress disorder)   . Sleep apnea    uses cpap    Past Surgical History:  Procedure Laterality Date  . CORONARY ARTERY BYPASS GRAFT N/A 06/04/2017   Procedure: CORONARY ARTERY BYPASS GRAFTING (CABG) x 3 WITH ENDOSCOPIC HARVESTING OF RIGHT SAPHENOUS VEIN;  Surgeon: Ivin Poot, MD;  Location: [facility];  Service: Open Heart Surgery;  Laterality: N/A;  . KNEE ARTHROSCOPY W/ MENISCAL REPAIR Right   . KNEE SURGERY Left   . LEFT HEART CATH AND CORONARY ANGIOGRAPHY N/A 05/28/2017   Procedure: LEFT HEART CATH AND CORONARY ANGIOGRAPHY;  Surgeon: Lorretta Harp, MD;  Location: [place] [facility];  Service: Cardiovascular;  Laterality: N/A;  . TEE WITHOUT CARDIOVERSION N/A  06/04/2017   Procedure: TRANSESOPHAGEAL ECHOCARDIOGRAM (TEE);  Surgeon: Prescott Gum, Collier Salina, MD;  Location: [facility];  Service: Open Heart Surgery;  Laterality: N/A;    There were no vitals filed for this visit.  Subjective Assessment - 03/19/19 2049    Subjective  Pt reports no problems or changes since eval last week    Pertinent History  Panic disorder    Patient Stated Goals  resolve the vertigo    Currently in Pain?  No/denies        Sensory Organization Test - composite score 65/100 with N= 68/100  Condition 1 - all 3 trials below N slightly Condition 2 - all 3 trials below N slightly Condition 3 - trials 1 and 2 below N: trial 3 WNL's Condition 4 - trials 1 & 2 below N:  Trial 3 WNL's Condition 5 - trials 1 and 3 below N:  Trial 2 WNL's Condition 6 - trials 1 and 2 WNL's; trial 3 below N slightly  Somatosensory and visual inputs WNL's; vestibular input decreased at 49/100 with N= 55/100     DVA - 4 line difference; static visual acuity line 10; dynamic visual acuity line 6              Balance Exercises - 03/19/19 2050  Balance Exercises: Standing   Standing Eyes Opened  Narrow base of support (BOS);Wide (BOA);Head turns;Foam/compliant surface;5 reps    Standing Eyes Closed  Narrow base of support (BOS);Wide (BOA);Head turns;Foam/compliant surface;5 reps        PT Education - 03/19/19 2054    Education Details  pt was given balance on foam - EO and EC - feet apart and together - and x1 viewing in standing for HEP    Person(s) Educated  Patient    Methods  Explanation;Demonstration;Handout    Comprehension  Verbalized understanding;Returned demonstration       PT Short Term Goals - 03/12/19 1553      PT SHORT TERM GOAL #1   Title  Pt will improve DHI by at least 10 points to indicate improvement in dizziness and less disability due to vertigo.    Baseline  To be completed next session    Time  4    Period  Weeks    Status  New    Target Date   04/11/19      PT SHORT TERM GOAL #2   Title  Pt will subjectively report at least 25% improvement in vertigo.    Time  4    Period  Weeks    Status  New    Target Date  04/11/19      PT SHORT TERM GOAL #3   Title  Pt will amb. 30' with horizontal head turns without LOB with c/o dizziness </= 3/10 intensity.    Time  4    Period  Weeks    Status  New    Target Date  04/11/19      PT SHORT TERM GOAL #4   Title  Independent in HEP for balance and vestibular exercises.    Time  4    Period  Weeks    Status  New    Target Date  04/11/19        PT Long Term Goals - 03/19/19 2059      PT LONG TERM GOAL #1   Title  Pt will improve DHI score by at least 20 points to indicate improvement in vertigo and less disability due to dizziness.    Time  8    Period  Weeks    Status  New      PT LONG TERM GOAL #2   Title  Pt will improve SOT composite score by at least 20 points to indicate improved balance and vestibular input.    Time  8    Period  Weeks    Status  New      PT LONG TERM GOAL #3   Title  Pt will have </= 2 line difference for DVA for improved VOR and gaze stabilization.    Time  8    Period  Weeks    Status  New      PT LONG TERM GOAL #4   Title  Pt will report at least 50% improvement in vertigo.    Time  8    Period  Weeks    Status  New      PT LONG TERM GOAL #5   Title  Independent in updated HEP for balance and vestibular exercises.    Time  8    Period  Weeks    Status  New            Plan - 03/19/19 2055    Clinical Impression Statement  Pt's SOT  composite score only slightly decreased from N with his score 65/100 and N=68/100;  somatosensory and visual inputs WNL's with vestibular input decreased at 49/100 with N= 56/100.    Personal Factors and Comorbidities  Behavior Pattern;Past/Current Experience;Comorbidity 1;Time since onset of injury/illness/exacerbation    Comorbidities  panic disorder, acute recurrent sinusitis, HTN, asthma     Examination-Activity Limitations  Transfers;Locomotion Level;Bend;Stand;Squat    Examination-Participation Restrictions  Community Activity;Driving;Shop;Yard Work;Cleaning;Other   carpentry projects - building a Scientist, research (medical)  Evolving/Moderate complexity    Rehab Potential  Good    PT Frequency  1x / week    PT Duration  8 weeks    PT Treatment/Interventions  ADLs/Self Care Home Management;Patient/family education;Vestibular;Therapeutic exercise;Therapeutic activities;Balance training;Neuromuscular re-education;Gait training;Stair training    PT Next Visit Plan  check HEP (balance on foam with EO and EC and x1 viewing)- continue balance and vestibular exercises - gait with head turns, tossing ball, etc.    PT Home Exercise Plan  balance on foam, x1 viewing    Consulted and Agree with Plan of Care  Patient       Patient will benefit from skilled therapeutic intervention in order to improve the following deficits and impairments:  Dizziness, Decreased balance  Visit Diagnosis: Unsteadiness on feet  Dizziness and giddiness     Problem List Patient Active Problem List   Diagnosis Date Noted  . Hx of CABG 06/26/2017  . Dyslipidemia, goal LDL below 70 06/26/2017  . Anxiety 06/26/2017  . Essential hypertension 06/26/2017  . Coronary artery disease 06/04/2017  . Chest pain 05/23/2017    Alda Lea, PT 03/19/2019, 9:00 PM  [facility] [address] [place], [place], [address] Phone: [phone]   Fax:  [phone]  Name: Elijah Knapp MRN: [id number] Date of Birth: 07-21-1955

## 2019-03-19 NOTE — Patient Instructions (Addendum)
  Gaze Stabilization: Standing Feet Apart    Feet shoulder width apart, keeping eyes on target on wall __6__ feet away, tilt head down 15-30 and move head side to side for _60__ seconds. Repeat while moving head up and down for _60___ seconds. Do _3___ sessions per day. Repeat using target on pattern background.   Feet Together (Compliant Surface) Varied Arm Positions - Eyes Closed - DISREGARD ARM MOTIONS    Stand on compliant surface: _pillow_______ with feet together and arms out. Close eyes and visualize upright position. Hold_30___ seconds. Repeat __1__ times per session. Do __1__ sessions per day.  Add head turns side to side and up/down 8-10 reps each direction       Feet Apart (Compliant Surface) Head Motion - Eyes Open    With eyes open, standing on compliant surface: ___pillows_____, feet shoulder width apart, move head slowly: up and down. Repeat __1__ times per session. Do _1___ sessions per day.  Add head turns with targets side to side and up/down 10 reps each direction

## 2019-03-24 ENCOUNTER — Ambulatory Visit: Payer: No Typology Code available for payment source | Admitting: Physical Therapy

## 2019-03-31 ENCOUNTER — Other Ambulatory Visit: Payer: Self-pay

## 2019-03-31 ENCOUNTER — Encounter: Payer: Self-pay | Admitting: Physical Therapy

## 2019-03-31 ENCOUNTER — Ambulatory Visit: Payer: No Typology Code available for payment source | Admitting: Physical Therapy

## 2019-03-31 DIAGNOSIS — R2681 Unsteadiness on feet: Secondary | ICD-10-CM

## 2019-03-31 DIAGNOSIS — R42 Dizziness and giddiness: Secondary | ICD-10-CM | POA: Diagnosis not present

## 2019-04-01 ENCOUNTER — Ambulatory Visit: Payer: No Typology Code available for payment source | Admitting: Physical Therapy

## 2019-04-01 NOTE — Therapy (Signed)
[facility] [address] [place], [place], [address] Phone: [phone]   Fax:  [phone]  Physical Therapy Treatment  Patient Details  Name: Elijah Knapp MRN: [id number] Date of Birth: 1955/04/06 Referring Provider (PT): Dr. Karna Dupes   Encounter Date: 03/31/2019  PT End of Session - 04/01/19 2052    Visit Number  3    Number of Visits  9   VA has approved 15 visits til 06-07-19   Date for PT Re-Evaluation  05/09/19    Authorization Type  VA    Authorization Time Period  02-07-19 - 06-07-19:  Referral # [id number]    Authorization - Visit Number  3    Authorization - Number of Visits  15    PT Start Time  1102    PT Stop Time  1150    PT Time Calculation (min)  48 min    Equipment Utilized During Treatment  Gait belt    Activity Tolerance  Patient tolerated treatment well    Behavior During Therapy  [facility] for tasks assessed/performed       Past Medical History:  Diagnosis Date  . Arthritis   . Asthma   . Chronic lung disease   . Complication of anesthesia    states he "stopped breathing" during knee surgery.   [name] COPD (chronic obstructive pulmonary disease) ([place])   . Coronary artery disease   . GERD (gastroesophageal reflux disease)   . Hypertension   . Leg cramps   . PTSD (post-traumatic stress disorder)   . Sleep apnea    uses cpap    Past Surgical History:  Procedure Laterality Date  . CORONARY ARTERY BYPASS GRAFT N/A 06/04/2017   Procedure: CORONARY ARTERY BYPASS GRAFTING (CABG) x 3 WITH ENDOSCOPIC HARVESTING OF RIGHT SAPHENOUS VEIN;  Surgeon: Ivin Poot, MD;  Location: [place];  Service: Open Heart Surgery;  Laterality: N/A;  . KNEE ARTHROSCOPY W/ MENISCAL REPAIR Right   . KNEE SURGERY Left   . LEFT HEART CATH AND CORONARY ANGIOGRAPHY N/A 05/28/2017   Procedure: LEFT HEART CATH AND CORONARY ANGIOGRAPHY;  Surgeon: Lorretta Harp, MD;  Location: [place] [facility];  Service: Cardiovascular;   Laterality: N/A;  . TEE WITHOUT CARDIOVERSION N/A 06/04/2017   Procedure: TRANSESOPHAGEAL ECHOCARDIOGRAM (TEE);  Surgeon: Prescott Gum, Collier Salina, MD;  Location: [facility];  Service: Open Heart Surgery;  Laterality: N/A;    There were no vitals filed for this visit.                         Balance Exercises - 04/01/19 2049      Balance Exercises: Standing   Standing Eyes Opened  Narrow base of support (BOS);Wide (BOA);Head turns;Foam/compliant surface;5 reps    Standing Eyes Closed  Wide (BOA);Head turns;Foam/compliant surface;5 reps    Rockerboard  Anterior/posterior;EO;EC;Other reps (comment)   10 reps each - EO and EC   Turning  Right;Left;Other (comment)   2 reps each side - standing on blue mat   Other Standing Exercises  pt performed sit to stand with 180 degree turns on mat with CGA;  amb. tossing ball straight up 40' x 2 reps; amb. tossing ball on Rt and Lt sides 40' x 2 reps;  standing on blue mat - making circles clockwise, counterclockwise, "V" and "+" patterns 5 reps each with CGA  for balance          PT Short Term Goals - 04/01/19  2055      PT SHORT TERM GOAL #1   Title  Pt will improve DHI by at least 10 points to indicate improvement in dizziness and less disability due to vertigo.    Baseline  To be completed next session    Time  4    Period  Weeks    Status  New    Target Date  04/11/19      PT SHORT TERM GOAL #2   Title  Pt will subjectively report at least 25% improvement in vertigo.    Time  4    Period  Weeks    Status  New    Target Date  04/11/19      PT SHORT TERM GOAL #3   Title  Pt will amb. 30' with horizontal head turns without LOB with c/o dizziness </= 3/10 intensity.    Time  4    Period  Weeks    Status  New    Target Date  04/11/19      PT SHORT TERM GOAL #4   Title  Independent in HEP for balance and vestibular exercises.    Time  4    Period  Weeks    Status  New    Target Date  04/11/19        PT Long Term Goals  - 04/01/19 2057      PT LONG TERM GOAL #1   Title  Pt will improve DHI score by at least 20 points to indicate improvement in vertigo and less disability due to dizziness.    Time  8    Period  Weeks    Status  New      PT LONG TERM GOAL #2   Title  Pt will improve SOT composite score by at least 20 points to indicate improved balance and vestibular input.    Time  8    Period  Weeks    Status  New      PT LONG TERM GOAL #3   Title  Pt will have </= 2 line difference for DVA for improved VOR and gaze stabilization.    Time  8    Period  Weeks    Status  New      PT LONG TERM GOAL #4   Title  Pt will report at least 50% improvement in vertigo.    Time  8    Period  Weeks    Status  New      PT LONG TERM GOAL #5   Title  Independent in updated HEP for balance and vestibular exercises.    Time  8    Period  Weeks    Status  New            Plan - 04/01/19 2053    Clinical Impression Statement  Pt reported increased dizziness with standing with EC on compliant surface with also with turning 180 degrees on compliant surface.  Pt's symptoms quickly subsided with short rest periods.  Pt is progressing well towards goals.    Personal Factors and Comorbidities  Behavior Pattern;Past/Current Experience;Comorbidity 1;Time since onset of injury/illness/exacerbation    Comorbidities  panic disorder, acute recurrent sinusitis, HTN, asthma    Examination-Activity Limitations  Transfers;Locomotion Level;Bend;Stand;Squat    Examination-Participation Restrictions  Community Activity;Driving;Shop;Yard Work;Cleaning;Other   carpentry projects - building a Scientist, research (medical)  Evolving/Moderate complexity    Rehab Potential  Good    PT Frequency  1x / week    PT Duration  8 weeks    PT Treatment/Interventions  ADLs/Self Care Home Management;Patient/family education;Vestibular;Therapeutic exercise;Therapeutic activities;Balance training;Neuromuscular re-education;Gait  training;Stair training    PT Next Visit Plan  score DHI (gave to pt on 03-31-19); continue balance and vestibular exercises - gait with head turns, tossing ball, etc.    PT Home Exercise Plan  balance on foam, x1 viewing    Consulted and Agree with Plan of Care  Patient       Patient will benefit from skilled therapeutic intervention in order to improve the following deficits and impairments:  Dizziness, Decreased balance  Visit Diagnosis: Unsteadiness on feet  Dizziness and giddiness     Problem List Patient Active Problem List   Diagnosis Date Noted  . Hx of CABG 06/26/2017  . Dyslipidemia, goal LDL below 70 06/26/2017  . Anxiety 06/26/2017  . Essential hypertension 06/26/2017  . Coronary artery disease 06/04/2017  . Chest pain 05/23/2017    Alda Lea, PT 04/01/2019, 8:58 PM  [facility] [address] [place], [place], [address] Phone: [phone]   Fax:  [phone]  Name: Elijah Knapp MRN: [id number] Date of Birth: 01-20-1956

## 2019-04-08 ENCOUNTER — Other Ambulatory Visit: Payer: Self-pay

## 2019-04-08 ENCOUNTER — Ambulatory Visit: Payer: No Typology Code available for payment source | Attending: Otolaryngology | Admitting: Physical Therapy

## 2019-04-08 DIAGNOSIS — R2689 Other abnormalities of gait and mobility: Secondary | ICD-10-CM | POA: Insufficient documentation

## 2019-04-08 DIAGNOSIS — R42 Dizziness and giddiness: Secondary | ICD-10-CM | POA: Diagnosis present

## 2019-04-08 DIAGNOSIS — R2681 Unsteadiness on feet: Secondary | ICD-10-CM | POA: Diagnosis present

## 2019-04-09 ENCOUNTER — Encounter: Payer: Self-pay | Admitting: Physical Therapy

## 2019-04-09 NOTE — Therapy (Signed)
[facility] [address] [place], [place], [address] Phone: [phone]   Fax:  [phone]  Physical Therapy Treatment  Patient Details  Name: Elijah Knapp MRN: [id number] Date of Birth: 07/12/1955 Referring Provider (PT): Dr. Karna Dupes   Encounter Date: 04/08/2019  PT End of Session - 04/09/19 1247    Visit Number  4    Number of Visits  9   VA has approved 15 visits til 06-07-19   Date for PT Re-Evaluation  05/09/19    Authorization Type  VA    Authorization Time Period  02-07-19 - 06-07-19:  Referral # [id number]    Authorization - Visit Number  4    Authorization - Number of Visits  15    PT Start Time  1017    PT Stop Time  1100    PT Time Calculation (min)  43 min    Equipment Utilized During Treatment  Gait belt    Activity Tolerance  Patient tolerated treatment well    Behavior During Therapy  WFL for tasks assessed/performed       Past Medical History:  Diagnosis Date  . Arthritis   . Asthma   . Chronic lung disease   . Complication of anesthesia    states he "stopped breathing" during knee surgery.   [name] COPD (chronic obstructive pulmonary disease) ([place])   . Coronary artery disease   . GERD (gastroesophageal reflux disease)   . Hypertension   . Leg cramps   . PTSD (post-traumatic stress disorder)   . Sleep apnea    uses cpap    Past Surgical History:  Procedure Laterality Date  . CORONARY ARTERY BYPASS GRAFT N/A 06/04/2017   Procedure: CORONARY ARTERY BYPASS GRAFTING (CABG) x 3 WITH ENDOSCOPIC HARVESTING OF RIGHT SAPHENOUS VEIN;  Surgeon: Ivin Poot, MD;  Location: [facility];  Service: Open Heart Surgery;  Laterality: N/A;  . KNEE ARTHROSCOPY W/ MENISCAL REPAIR Right   . KNEE SURGERY Left   . LEFT HEART CATH AND CORONARY ANGIOGRAPHY N/A 05/28/2017   Procedure: LEFT HEART CATH AND CORONARY ANGIOGRAPHY;  Surgeon: Lorretta Harp, MD;  Location: [place] [facility];  Service: Cardiovascular;   Laterality: N/A;  . TEE WITHOUT CARDIOVERSION N/A 06/04/2017   Procedure: TRANSESOPHAGEAL ECHOCARDIOGRAM (TEE);  Surgeon: Prescott Gum, Collier Salina, MD;  Location: [facility];  Service: Open Heart Surgery;  Laterality: N/A;    There were no vitals filed for this visit.  Subjective Assessment - 04/08/19 1017    Subjective  Pt reports about 50% time he is nauseated after doing the exercises; states even just walking can trigger the nausea - states the exercises "must be working"    Pertinent History  Panic disorder    Patient Stated Goals  resolve the vertigo    Currently in Pain?  No/denies        Balance Exercises - 04/01/19 2049      Balance Exercises: Standing   Standing Eyes Opened  Narrow base of support (BOS);Wide (BOA);Head turns;Foam/compliant surface;5 reps    Standing Eyes Closed  Wide (BOA);Head turns;Foam/compliant surface;5 reps    Rockerboard  Anterior/posterior;EO;EC;Other reps (comment)   10 reps each - EO and EC   Turning  Right;Left;Other (comment)   2 reps each side - standing on blue mat   Other Standing Exercises  pt performed sit to stand with 180 degree turns on mat with CGA;  amb. tossing ball straight up 40' x 2 reps; amb. tossing  ball on Rt and Lt sides 40' x 2 reps;  standing on blue mat - making circles clockwise, counterclockwise, "V" and "+" patterns 5 reps each with CGA  for balance        Pt performed marching on incline and on decline - EO with no head movement, progressing to EO horizontal head turns 5 reps, Vertical head turns 5 reps:  EC no head movement marching in place, then added EC marching with horizontal head turns 5 reps and Vertical head turns 5 reps with min to mod assist for recovery of LOB                          PT Short Term Goals - 04/08/19 1019      PT SHORT TERM GOAL #1   Title  Pt will improve DHI by at least 10 points to indicate improvement in dizziness and less disability due to vertigo.    Baseline  To be  completed next session - pt forgot to bring [facility] today - 04-08-19    Time  4    Period  Weeks    Status  New    Target Date  04/11/19      PT SHORT TERM GOAL #2   Title  Pt will subjectively report at least 25% improvement in vertigo.    Baseline  Pt reports dizziness is about the same; states the nausea is provoked with the exercises - 04-08-19    Time  4    Period  Weeks    Status  Not Met    Target Date  04/11/19      PT SHORT TERM GOAL #3   Title  Pt will amb. 30' with horizontal head turns without LOB with c/o dizziness </= 3/10 intensity.    Baseline  3-4/10 - 04-08-19    Time  4    Period  Weeks    Status  Partially Met    Target Date  04/11/19      PT SHORT TERM GOAL #4   Title  Independent in HEP for balance and vestibular exercises.    Time  4    Period  Weeks    Status  Achieved    Target Date  04/11/19        PT Long Term Goals - 04/09/19 1254      PT LONG TERM GOAL #1   Title  Pt will improve DHI score by at least 20 points to indicate improvement in vertigo and less disability due to dizziness.    Time  8    Period  Weeks    Status  New      PT LONG TERM GOAL #2   Title  Pt will improve SOT composite score by at least 20 points to indicate improved balance and vestibular input.    Time  8    Period  Weeks    Status  New      PT LONG TERM GOAL #3   Title  Pt will have </= 2 line difference for DVA for improved VOR and gaze stabilization.    Time  8    Period  Weeks    Status  New      PT LONG TERM GOAL #4   Title  Pt will report at least 50% improvement in vertigo.    Time  8    Period  Weeks    Status  New  PT LONG TERM GOAL #5   Title  Independent in updated HEP for balance and vestibular exercises.    Time  8    Period  Weeks    Status  New            Plan - 04/09/19 1248    Clinical Impression Statement  Pt has met LTG #4 and partially met LTG #3:  goal #2 not met as pt reports no major change in vertigo:  goal #1 is ongoing as pt  forgot to bring completed [place] to session today.  Pt reported highest intensity of dizziness at 4.5/10 intensity with baseline intensity reported as 3/10; pt able to perform challenging activities requiring mod to max vestibular input with minimal to slight increase in vertigo per pt report.  Pt is progressing well towards goals.    Personal Factors and Comorbidities  Behavior Pattern;Past/Current Experience;Comorbidity 1;Time since onset of injury/illness/exacerbation    Comorbidities  panic disorder, acute recurrent sinusitis, HTN, asthma    Examination-Activity Limitations  Transfers;Locomotion Level;Bend;Stand;Squat    Examination-Participation Restrictions  Community Activity;Driving;Shop;Yard Work;Cleaning;Other   carpentry projects - building a Scientist, research (medical)  Evolving/Moderate complexity    Rehab Potential  Good    PT Frequency  1x / week    PT Duration  8 weeks    PT Treatment/Interventions  ADLs/Self Care Home Management;Patient/family education;Vestibular;Therapeutic exercise;Therapeutic activities;Balance training;Neuromuscular re-education;Gait training;Stair training    PT Next Visit Plan  score DHI (gave to pt on 03-31-19); continue balance and vestibular exercises - gait with head turns, tossing ball, etc.    PT Home Exercise Plan  balance on foam, x1 viewing    Consulted and Agree with Plan of Care  Patient       Patient will benefit from skilled therapeutic intervention in order to improve the following deficits and impairments:  Dizziness, Decreased balance  Visit Diagnosis: Dizziness and giddiness  Unsteadiness on feet     Problem List Patient Active Problem List   Diagnosis Date Noted  . Hx of CABG 06/26/2017  . Dyslipidemia, goal LDL below 70 06/26/2017  . Anxiety 06/26/2017  . Essential hypertension 06/26/2017  . Coronary artery disease 06/04/2017  . Chest pain 05/23/2017    Alda Lea, PT 04/09/2019, 12:55 PM  [facility] [address] [place], [place], [address] Phone: [phone]   Fax:  [phone]  Name: MILFORD CILENTO MRN: [id number] Date of Birth: 1955-06-01

## 2019-04-15 ENCOUNTER — Other Ambulatory Visit: Payer: Self-pay

## 2019-04-15 ENCOUNTER — Encounter: Payer: Self-pay | Admitting: Physical Therapy

## 2019-04-15 ENCOUNTER — Ambulatory Visit: Payer: No Typology Code available for payment source | Admitting: Physical Therapy

## 2019-04-15 DIAGNOSIS — R42 Dizziness and giddiness: Secondary | ICD-10-CM

## 2019-04-15 DIAGNOSIS — R2681 Unsteadiness on feet: Secondary | ICD-10-CM

## 2019-04-15 NOTE — Therapy (Signed)
[facility] [address] [place], [place], [address] Phone: [phone]   Fax:  [phone]  Physical Therapy Treatment  Patient Details  Name: Elijah Knapp MRN: [id number] Date of Birth: [birth year] Referring Provider (PT): Dr. Karna Dupes   Encounter Date: 04/15/2019  PT End of Session - 04/15/19 2152    Visit Number  5    Number of Visits  9   VA has approved 15 visits til 06-07-19   Date for PT Re-Evaluation  05/09/19    Authorization Type  VA    Authorization Time Period  02-07-19 - 06-07-19:  Referral # [id number]    Authorization - Visit Number  5    Authorization - Number of Visits  15    PT Start Time  1015    PT Stop Time  1101    PT Time Calculation (min)  46 min    Equipment Utilized During Treatment  Gait belt    Activity Tolerance  Patient tolerated treatment well    Behavior During Therapy  [facility] for tasks assessed/performed       Past Medical History:  Diagnosis Date  . Arthritis   . Asthma   . Chronic lung disease   . Complication of anesthesia    states he "stopped breathing" during knee surgery.   [name] COPD (chronic obstructive pulmonary disease) ([place])   . Coronary artery disease   . GERD (gastroesophageal reflux disease)   . Hypertension   . Leg cramps   . PTSD (post-traumatic stress disorder)   . Sleep apnea    uses cpap    Past Surgical History:  Procedure Laterality Date  . CORONARY ARTERY BYPASS GRAFT N/A 06/04/2017   Procedure: CORONARY ARTERY BYPASS GRAFTING (CABG) x 3 WITH ENDOSCOPIC HARVESTING OF RIGHT SAPHENOUS VEIN;  Surgeon: Ivin Poot, MD;  Location: [facility];  Service: Open Heart Surgery;  Laterality: N/A;  . KNEE ARTHROSCOPY W/ MENISCAL REPAIR Right   . KNEE SURGERY Left   . LEFT HEART CATH AND CORONARY ANGIOGRAPHY N/A 05/28/2017   Procedure: LEFT HEART CATH AND CORONARY ANGIOGRAPHY;  Surgeon: Lorretta Harp, MD;  Location: [place] [facility] [facility];  Service: Cardiovascular;   Laterality: N/A;  . TEE WITHOUT CARDIOVERSION N/A 06/04/2017   Procedure: TRANSESOPHAGEAL ECHOCARDIOGRAM (TEE);  Surgeon: Prescott Gum, Collier Salina, MD;  Location: [facility];  Service: Open Heart Surgery;  Laterality: N/A;    There were no vitals filed for this visit.  Subjective Assessment - 04/15/19 1021    Subjective  Pt states he got a little dizzy when he got up in waiting room to come back; rates dizziness 2/10 intensity; pt has brought the [place] back - completed (40%)    Pertinent History  Panic disorder    Patient Stated Goals  resolve the vertigo    Currently in Pain?  No/denies                  NeuroRe-ed:   Pt amb. 60' x 4 reps reading cards on Rt and Lt sides for improved gaze stabilization - no major LOB occurred with this activity  Standing on blue mat - crossovers front with 180 degree turn 3 reps Marching forward/backward on blue mat with CGA  Standing on blue mat - reaching down toward Lt foot holding medicine ball - lifting up and to the right side and then opposite pattern ("X) 10 reps each side    Balance Exercises - 04/15/19 2151  Balance Exercises: Standing   Standing Eyes Opened  Wide (BOA);Head turns;Foam/compliant surface;5 reps    Standing Eyes Closed  Wide (BOA);Head turns;Foam/compliant surface;5 reps    Rockerboard  Anterior/posterior;EO;EC;Other reps (comment)   10 reps each - EO and EC   Other Standing Exercises  pt performed sit to stand with 180 degree turns on mat with CGA;  amb. tossing ball straight up 40' x 2 reps; amb. tossing ball on Rt and Lt sides 40' x 2 reps;  standing on blue mat - making circles clockwise, counterclockwise, "V" and "+" patterns 5 reps each with CGA  for balance          PT Short Term Goals - 04/15/19 1018      PT SHORT TERM GOAL #1   Title  Pt will improve DHI by at least 10 points to indicate improvement in dizziness and less disability due to vertigo.    Baseline  To be completed next session - pt forgot to  bring [facility] today - 04-08-19;     40% on 04-15-19    Time  4    Period  Weeks    Status  On-going    Target Date  04/11/19      PT SHORT TERM GOAL #2   Title  Pt will subjectively report at least 25% improvement in vertigo.    Baseline  Pt reports dizziness is about the same; states the nausea is provoked with the exercises - 04-08-19; same 04-15-19    Time  4    Period  Weeks    Status  On-going    Target Date  04/11/19      PT SHORT TERM GOAL #3   Title  Pt will amb. 30' with horizontal head turns without LOB with c/o dizziness </= 3/10 intensity.    Baseline  3-4/10 - 04-08-19    Time  4    Period  Weeks    Status  Partially Met    Target Date  04/11/19      PT SHORT TERM GOAL #4   Title  Independent in HEP for balance and vestibular exercises.    Time  4    Period  Weeks    Status  Achieved    Target Date  04/11/19        PT Long Term Goals - 04/15/19 2203      PT LONG TERM GOAL #1   Title  Pt will improve DHI score by at least 20 points to indicate improvement in vertigo and less disability due to dizziness.    Time  8    Period  Weeks    Status  New      PT LONG TERM GOAL #2   Title  Pt will improve SOT composite score by at least 20 points to indicate improved balance and vestibular input.    Time  8    Period  Weeks    Status  New      PT LONG TERM GOAL #3   Title  Pt will have </= 2 line difference for DVA for improved VOR and gaze stabilization.    Time  8    Period  Weeks    Status  New      PT LONG TERM GOAL #4   Title  Pt will report at least 50% improvement in vertigo.    Time  8    Period  Weeks    Status  New  PT LONG TERM GOAL #5   Title  Independent in updated HEP for balance and vestibular exercises.    Time  8    Period  Weeks    Status  New            Plan - 04/15/19 2153    Clinical Impression Statement  Pt tolerated exercises well with highest rating of dizziness 3.5 - 4/10 intensity provoked with dynamic gait/vestibular  activities.  Pt's DHI score 40% (10 yes answers, 0 sometimes answers)    Personal Factors and Comorbidities  Behavior Pattern;Past/Current Experience;Comorbidity 1;Time since onset of injury/illness/exacerbation    Comorbidities  panic disorder, acute recurrent sinusitis, HTN, asthma    Examination-Activity Limitations  Transfers;Locomotion Level;Bend;Stand;Squat    Examination-Participation Restrictions  Community Activity;Driving;Shop;Yard Work;Cleaning;Other   carpentry projects - building a Scientist, research (medical)  Evolving/Moderate complexity    Rehab Potential  Good    PT Frequency  1x / week    PT Duration  8 weeks    PT Treatment/Interventions  ADLs/Self Care Home Management;Patient/family education;Vestibular;Therapeutic exercise;Therapeutic activities;Balance training;Neuromuscular re-education;Gait training;Stair training    PT Next Visit Plan  continue balance and vestibular exercises - gait with head turns, tossing ball, etc.    PT Home Exercise Plan  balance on foam, x1 viewing    Consulted and Agree with Plan of Care  Patient       Patient will benefit from skilled therapeutic intervention in order to improve the following deficits and impairments:  Dizziness, Decreased balance  Visit Diagnosis: Dizziness and giddiness  Unsteadiness on feet     Problem List Patient Active Problem List   Diagnosis Date Noted  . Hx of CABG 06/26/2017  . Dyslipidemia, goal LDL below 70 06/26/2017  . Anxiety 06/26/2017  . Essential hypertension 06/26/2017  . Coronary artery disease 06/04/2017  . Chest pain 05/23/2017    Alda Lea, PT 04/15/2019, 10:05 PM  [facility] [address] [place], [place], [address] Phone: [phone]   Fax:  [phone]  Name: Elijah Knapp MRN: [id number] Date of Birth: 1955-04-21

## 2019-04-22 ENCOUNTER — Ambulatory Visit: Payer: No Typology Code available for payment source | Admitting: Physical Therapy

## 2019-04-25 ENCOUNTER — Other Ambulatory Visit: Payer: Self-pay

## 2019-04-25 ENCOUNTER — Encounter: Payer: Self-pay | Admitting: Physical Therapy

## 2019-04-25 ENCOUNTER — Ambulatory Visit: Payer: No Typology Code available for payment source | Admitting: Physical Therapy

## 2019-04-25 DIAGNOSIS — R2689 Other abnormalities of gait and mobility: Secondary | ICD-10-CM

## 2019-04-25 DIAGNOSIS — R42 Dizziness and giddiness: Secondary | ICD-10-CM | POA: Diagnosis not present

## 2019-04-25 DIAGNOSIS — R2681 Unsteadiness on feet: Secondary | ICD-10-CM

## 2019-04-25 NOTE — Therapy (Signed)
[facility] [address] [place], [place], [address] Phone: [phone]   Fax:  [phone]  Physical Therapy Treatment  Patient Details  Name: Elijah Knapp MRN: [id number] Date of Birth: 05-01-1955 Referring Provider (PT): Dr. Karna Dupes   Encounter Date: 04/25/2019  PT End of Session - 04/25/19 1159    Visit Number  6    Number of Visits  9   VA has approved 15 visits til 06-07-19   Date for PT Re-Evaluation  05/09/19    Authorization Type  VA    Authorization Time Period  02-07-19 - 06-07-19:  Referral # [id number]    Authorization - Visit Number  6    Authorization - Number of Visits  15    PT Start Time  1104    PT Stop Time  1148    PT Time Calculation (min)  44 min    Equipment Utilized During Treatment  Gait belt    Activity Tolerance  Patient tolerated treatment well    Behavior During Therapy  [facility] for tasks assessed/performed       Past Medical History:  Diagnosis Date  . Arthritis   . Asthma   . Chronic lung disease   . Complication of anesthesia    states he "stopped breathing" during knee surgery.   [name] COPD (chronic obstructive pulmonary disease) ([place])   . Coronary artery disease   . GERD (gastroesophageal reflux disease)   . Hypertension   . Leg cramps   . PTSD (post-traumatic stress disorder)   . Sleep apnea    uses cpap    Past Surgical History:  Procedure Laterality Date  . CORONARY ARTERY BYPASS GRAFT N/A 06/04/2017   Procedure: CORONARY ARTERY BYPASS GRAFTING (CABG) x 3 WITH ENDOSCOPIC HARVESTING OF RIGHT SAPHENOUS VEIN;  Surgeon: Ivin Poot, MD;  Location: [facility];  Service: Open Heart Surgery;  Laterality: N/A;  . KNEE ARTHROSCOPY W/ MENISCAL REPAIR Right   . KNEE SURGERY Left   . LEFT HEART CATH AND CORONARY ANGIOGRAPHY N/A 05/28/2017   Procedure: LEFT HEART CATH AND CORONARY ANGIOGRAPHY;  Surgeon: Lorretta Harp, MD;  Location: [facility];  Service: Cardiovascular;   Laterality: N/A;  . TEE WITHOUT CARDIOVERSION N/A 06/04/2017   Procedure: TRANSESOPHAGEAL ECHOCARDIOGRAM (TEE);  Surgeon: Prescott Gum, Collier Salina, MD;  Location: [facility];  Service: Open Heart Surgery;  Laterality: N/A;    There were no vitals filed for this visit.  Subjective Assessment - 04/25/19 1106    Subjective  Has been working on the exercises 3x/week.Rates dizziness 1/10 at present.  States that going up/down stairs makes it increase.    Pertinent History  Panic disorder    Patient Stated Goals  resolve the vertigo    Currently in Pain?  No/denies       NeuroRe-ed:  Standing on blue mat marching forward<>backward x 8' x 6  Standing on blue mat - reaching down toward Lt foot holding medicine ball - lifting up and to the right side and then opposite pattern ("X) 10 reps each side    Balance Exercises - 04/25/19 1203      Balance Exercises: Standing   Standing Eyes Opened  Wide (BOA);Head turns;Foam/compliant surface;5 reps    Standing Eyes Closed  Wide (BOA);Head turns;Foam/compliant surface;5 reps    Rockerboard  Anterior/posterior;Lateral;Head turns;EO;EC;10 seconds;20 seconds;10 reps;Intermittent UE support    Turning  Right;Left;Other (comment)    Other Standing Exercises  pt performed sit to stand  with 180 degree turns on mat with CGA;  amb. tossing ball straight up 110' x 2 reps; amb. tossing ball on Rt and Lt sides 110' x 2 reps;  standing on blue mat - making circles clockwise, counterclockwise, "V" and "+" patterns 10 reps each with CGA  for balance          PT Short Term Goals - 04/15/19 1018      PT SHORT TERM GOAL #1   Title  Pt will improve DHI by at least 10 points to indicate improvement in dizziness and less disability due to vertigo.    Baseline  To be completed next session - pt forgot to bring [facility] today - 04-08-19;     40% on 04-15-19    Time  4    Period  Weeks    Status  On-going    Target Date  04/11/19      PT SHORT TERM GOAL #2   Title  Pt will  subjectively report at least 25% improvement in vertigo.    Baseline  Pt reports dizziness is about the same; states the nausea is provoked with the exercises - 04-08-19; same 04-15-19    Time  4    Period  Weeks    Status  On-going    Target Date  04/11/19      PT SHORT TERM GOAL #3   Title  Pt will amb. 30' with horizontal head turns without LOB with c/o dizziness </= 3/10 intensity.    Baseline  3-4/10 - 04-08-19    Time  4    Period  Weeks    Status  Partially Met    Target Date  04/11/19      PT SHORT TERM GOAL #4   Title  Independent in HEP for balance and vestibular exercises.    Time  4    Period  Weeks    Status  Achieved    Target Date  04/11/19        PT Long Term Goals - 04/15/19 2203      PT LONG TERM GOAL #1   Title  Pt will improve DHI score by at least 20 points to indicate improvement in vertigo and less disability due to dizziness.    Time  8    Period  Weeks    Status  New      PT LONG TERM GOAL #2   Title  Pt will improve SOT composite score by at least 20 points to indicate improved balance and vestibular input.    Time  8    Period  Weeks    Status  New      PT LONG TERM GOAL #3   Title  Pt will have </= 2 line difference for DVA for improved VOR and gaze stabilization.    Time  8    Period  Weeks    Status  New      PT LONG TERM GOAL #4   Title  Pt will report at least 50% improvement in vertigo.    Time  8    Period  Weeks    Status  New      PT LONG TERM GOAL #5   Title  Independent in updated HEP for balance and vestibular exercises.    Time  8    Period  Weeks    Status  New            Plan - 04/25/19 1200  Clinical Impression Statement  Pt continues to dizziness but is reporting decrease in dizziness rating to 1-2/10 this session.  Asking appropriate questions related to vertigo and treatment.  Continue PT per POC.    Personal Factors and Comorbidities  Behavior Pattern;Past/Current Experience;Comorbidity 1;Time since onset of  injury/illness/exacerbation    Comorbidities  panic disorder, acute recurrent sinusitis, HTN, asthma    Examination-Activity Limitations  Transfers;Locomotion Level;Bend;Stand;Squat    Examination-Participation Restrictions  Community Activity;Driving;Shop;Yard Work;Cleaning;Other   carpentry projects - building a Scientist, research (medical)  Evolving/Moderate complexity    Rehab Potential  Good    PT Frequency  1x / week    PT Duration  8 weeks    PT Treatment/Interventions  ADLs/Self Care Home Management;Patient/family education;Vestibular;Therapeutic exercise;Therapeutic activities;Balance training;Neuromuscular re-education;Gait training;Stair training    PT Next Visit Plan  continue balance and vestibular exercises - gait with head turns, tossing ball, etc.    PT Home Exercise Plan  balance on foam, x1 viewing    Consulted and Agree with Plan of Care  Patient       Patient will benefit from skilled therapeutic intervention in order to improve the following deficits and impairments:  Dizziness, Decreased balance  Visit Diagnosis: Dizziness and giddiness  Unsteadiness on feet  Other abnormalities of gait and mobility     Problem List Patient Active Problem List   Diagnosis Date Noted  . Hx of CABG 06/26/2017  . Dyslipidemia, goal LDL below 70 06/26/2017  . Anxiety 06/26/2017  . Essential hypertension 06/26/2017  . Coronary artery disease 06/04/2017  . Chest pain 05/23/2017    Narda Bonds, [place] 04/25/19 12:17 PM Phone: [phone] Fax: [facility] [address] [place], [place], [address] Phone: [phone]   Fax:  [phone]  Name: Elijah Knapp MRN: [id number] Date of Birth: 1955-06-09

## 2019-05-01 ENCOUNTER — Other Ambulatory Visit: Payer: Self-pay

## 2019-05-01 ENCOUNTER — Ambulatory Visit: Payer: No Typology Code available for payment source | Admitting: Physical Therapy

## 2019-05-01 ENCOUNTER — Encounter: Payer: Self-pay | Admitting: Physical Therapy

## 2019-05-01 DIAGNOSIS — R2681 Unsteadiness on feet: Secondary | ICD-10-CM

## 2019-05-01 DIAGNOSIS — R2689 Other abnormalities of gait and mobility: Secondary | ICD-10-CM

## 2019-05-01 DIAGNOSIS — R42 Dizziness and giddiness: Secondary | ICD-10-CM

## 2019-05-01 NOTE — Therapy (Signed)
[facility] [address] [place], [place], [address] Phone: [phone]   Fax:  [phone]  Physical Therapy Treatment  Patient Details  Name: Elijah Knapp MRN: [id number] Date of Birth: 02-14-1956 Referring Provider (PT): Dr. Karna Dupes   Encounter Date: 05/01/2019  PT End of Session - 05/01/19 1307    Visit Number  7    Number of Visits  9   VA has approved 15 visits til 06-07-19   Date for PT Re-Evaluation  05/09/19    Authorization Type  VA    Authorization Time Period  02-07-19 - 06-07-19:  Referral # [id number]    Authorization - Visit Number  7    Authorization - Number of Visits  15    PT Start Time  1015    PT Stop Time  1100    PT Time Calculation (min)  45 min    Equipment Utilized During Treatment  Gait belt    Activity Tolerance  Patient tolerated treatment well    Behavior During Therapy  [facility] for tasks assessed/performed       Past Medical History:  Diagnosis Date  . Arthritis   . Asthma   . Chronic lung disease   . Complication of anesthesia    states he "stopped breathing" during knee surgery.   [name] COPD (chronic obstructive pulmonary disease) ([place])   . Coronary artery disease   . GERD (gastroesophageal reflux disease)   . Hypertension   . Leg cramps   . PTSD (post-traumatic stress disorder)   . Sleep apnea    uses cpap    Past Surgical History:  Procedure Laterality Date  . CORONARY ARTERY BYPASS GRAFT N/A 06/04/2017   Procedure: CORONARY ARTERY BYPASS GRAFTING (CABG) x 3 WITH ENDOSCOPIC HARVESTING OF RIGHT SAPHENOUS VEIN;  Surgeon: Ivin Poot, MD;  Location: [facility];  Service: Open Heart Surgery;  Laterality: N/A;  . KNEE ARTHROSCOPY W/ MENISCAL REPAIR Right   . KNEE SURGERY Left   . LEFT HEART CATH AND CORONARY ANGIOGRAPHY N/A 05/28/2017   Procedure: LEFT HEART CATH AND CORONARY ANGIOGRAPHY;  Surgeon: Lorretta Harp, MD;  Location: [facility];  Service: Cardiovascular;   Laterality: N/A;  . TEE WITHOUT CARDIOVERSION N/A 06/04/2017   Procedure: TRANSESOPHAGEAL ECHOCARDIOGRAM (TEE);  Surgeon: Prescott Gum, Collier Salina, MD;  Location: [facility];  Service: Open Heart Surgery;  Laterality: N/A;    There were no vitals filed for this visit.  Subjective Assessment - 05/01/19 1016    Subjective  Reports dizziness is getting better each day and dizziness rarely goes above a 5/10.  Currently a 1/10.  States that he is building a deck and that he notices the dizziness when looking down to ground while on deck floor joists.    Currently in Pain?  No/denies         Balance Exercises - 05/01/19 1300      Balance Exercises: Standing   Standing Eyes Opened  Wide (BOA);Head turns;Foam/compliant surface;5 reps    Standing Eyes Closed  Wide (BOA);Head turns;Foam/compliant surface;5 reps    Rockerboard  Anterior/posterior;Lateral;Head turns;EO;EC;10 seconds;20 seconds;10 reps;Intermittent UE support    Other Standing Exercises  pt amb. tossing ball straight up 240' Pt did have increased dizziness to 5/10 with this activity and c/o mild nausea.  Pt was ambulation around entire clinic with turns and increased visual items in tight spaces which may have lead to increased dizziness.  Discussed pt walking in hallway at home  with head turns/nods for practice.  ;standing on blue mat - making circles clockwise, counterclockwise, "V" and "+" patterns 10 reps each with CGA  for balance    Other Standing Exercises Comments  stepping onto/off of 8' step then stepping onto 8"step with 1 leg then taping chair in front with other foot then back to ground.  Repeated x 10 times each side leading then had pt stand on compliant mat and perform the same.  Standing on ramp facing up then facing down for eyes closed x 10-20 sec, head turns and head nods.        PT Education - 05/01/19 1307    Education Details  walking in hallway at home and performing head turns    Person(s) Educated  Patient    Methods   Explanation;Demonstration    Comprehension  Verbalized understanding       PT Short Term Goals - 04/15/19 1018      PT SHORT TERM GOAL #1   Title  Pt will improve DHI by at least 10 points to indicate improvement in dizziness and less disability due to vertigo.    Baseline  To be completed next session - pt forgot to bring [facility] today - 04-08-19;     40% on 04-15-19    Time  4    Period  Weeks    Status  On-going    Target Date  04/11/19      PT SHORT TERM GOAL #2   Title  Pt will subjectively report at least 25% improvement in vertigo.    Baseline  Pt reports dizziness is about the same; states the nausea is provoked with the exercises - 04-08-19; same 04-15-19    Time  4    Period  Weeks    Status  On-going    Target Date  04/11/19      PT SHORT TERM GOAL #3   Title  Pt will amb. 30' with horizontal head turns without LOB with c/o dizziness </= 3/10 intensity.    Baseline  3-4/10 - 04-08-19    Time  4    Period  Weeks    Status  Partially Met    Target Date  04/11/19      PT SHORT TERM GOAL #4   Title  Independent in HEP for balance and vestibular exercises.    Time  4    Period  Weeks    Status  Achieved    Target Date  04/11/19        PT Long Term Goals - 04/15/19 2203      PT LONG TERM GOAL #1   Title  Pt will improve DHI score by at least 20 points to indicate improvement in vertigo and less disability due to dizziness.    Time  8    Period  Weeks    Status  New      PT LONG TERM GOAL #2   Title  Pt will improve SOT composite score by at least 20 points to indicate improved balance and vestibular input.    Time  8    Period  Weeks    Status  New      PT LONG TERM GOAL #3   Title  Pt will have </= 2 line difference for DVA for improved VOR and gaze stabilization.    Time  8    Period  Weeks    Status  New      PT LONG TERM  GOAL #4   Title  Pt will report at least 50% improvement in vertigo.    Time  8    Period  Weeks    Status  New      PT LONG TERM GOAL  #5   Title  Independent in updated HEP for balance and vestibular exercises.    Time  8    Period  Weeks    Status  New            Plan - 05/01/19 1308    Clinical Impression Statement  Skilled session continues to focus on vestibular/balance activities.  Pt did have increased dizziness to 5/10 with ambulation in tight spaces while tossing ball.  Pt continues to perform his HEP.  Continue per POC.    Personal Factors and Comorbidities  Behavior Pattern;Past/Current Experience;Comorbidity 1;Time since onset of injury/illness/exacerbation    Comorbidities  panic disorder, acute recurrent sinusitis, HTN, asthma    Examination-Activity Limitations  Transfers;Locomotion Level;Bend;Stand;Squat    Examination-Participation Restrictions  Community Activity;Driving;Shop;Yard Work;Cleaning;Other   carpentry projects - building a Scientist, research (medical)  Evolving/Moderate complexity    Rehab Potential  Good    PT Frequency  1x / week    PT Duration  8 weeks    PT Treatment/Interventions  ADLs/Self Care Home Management;Patient/family education;Vestibular;Therapeutic exercise;Therapeutic activities;Balance training;Neuromuscular re-education;Gait training;Stair training    PT Next Visit Plan  continue balance and vestibular exercises - gait with head turns, tossing ball, etc.    PT Home Exercise Plan  balance on foam, x1 viewing    Consulted and Agree with Plan of Care  Patient       Patient will benefit from skilled therapeutic intervention in order to improve the following deficits and impairments:  Dizziness, Decreased balance  Visit Diagnosis: Dizziness and giddiness  Unsteadiness on feet  Other abnormalities of gait and mobility     Problem List Patient Active Problem List   Diagnosis Date Noted  . Hx of CABG 06/26/2017  . Dyslipidemia, goal LDL below 70 06/26/2017  . Anxiety 06/26/2017  . Essential hypertension 06/26/2017  . Coronary artery disease  06/04/2017  . Chest pain 05/23/2017    Narda Bonds, PTA [place] 05/01/19 1:13 PM Phone: [phone] Fax: [facility] [address] [place], [place], [address] Phone: [phone]   Fax:  [phone]  Name: Elijah Knapp MRN: [id number] Date of Birth: [birth year]

## 2019-05-06 ENCOUNTER — Ambulatory Visit: Payer: No Typology Code available for payment source | Admitting: Physical Therapy

## 2019-05-08 ENCOUNTER — Ambulatory Visit: Payer: No Typology Code available for payment source | Attending: Otolaryngology | Admitting: Physical Therapy

## 2019-05-08 ENCOUNTER — Other Ambulatory Visit: Payer: Self-pay

## 2019-05-08 ENCOUNTER — Encounter: Payer: Self-pay | Admitting: Physical Therapy

## 2019-05-08 DIAGNOSIS — R2681 Unsteadiness on feet: Secondary | ICD-10-CM

## 2019-05-08 DIAGNOSIS — R42 Dizziness and giddiness: Secondary | ICD-10-CM

## 2019-05-09 NOTE — Therapy (Signed)
[facility] [address] [place], [place], [address] Phone: [phone]   Fax:  [phone]  Physical Therapy Treatment  Patient Details  Name: Elijah Knapp MRN: [id number] Date of Birth: [birth year] Referring Provider (PT): Dr. Karna Dupes   Encounter Date: 05/08/2019  PT End of Session - 05/09/19 1704    Visit Number  8    Number of Visits  9   VA has approved 15 visits til 06-07-19   Date for PT Re-Evaluation  05/09/19    Authorization Type  VA    Authorization Time Period  02-07-19 - 06-07-19:  Referral # [id number]    Authorization - Visit Number  8    Authorization - Number of Visits  15    PT Start Time  7425    PT Stop Time  1700    PT Time Calculation (min)  43 min    Equipment Utilized During Treatment  Gait belt    Activity Tolerance  Patient tolerated treatment well    Behavior During Therapy  [facility] for tasks assessed/performed       Past Medical History:  Diagnosis Date  . Arthritis   . Asthma   . Chronic lung disease   . Complication of anesthesia    states he "stopped breathing" during knee surgery.   [name] COPD (chronic obstructive pulmonary disease) ([place])   . Coronary artery disease   . GERD (gastroesophageal reflux disease)   . Hypertension   . Leg cramps   . PTSD (post-traumatic stress disorder)   . Sleep apnea    uses cpap    Past Surgical History:  Procedure Laterality Date  . CORONARY ARTERY BYPASS GRAFT N/A 06/04/2017   Procedure: CORONARY ARTERY BYPASS GRAFTING (CABG) x 3 WITH ENDOSCOPIC HARVESTING OF RIGHT SAPHENOUS VEIN;  Surgeon: Ivin Poot, MD;  Location: [facility];  Service: Open Heart Surgery;  Laterality: N/A;  . KNEE ARTHROSCOPY W/ MENISCAL REPAIR Right   . KNEE SURGERY Left   . LEFT HEART CATH AND CORONARY ANGIOGRAPHY N/A 05/28/2017   Procedure: LEFT HEART CATH AND CORONARY ANGIOGRAPHY;  Surgeon: Lorretta Harp, MD;  Location: [facility];  Service: Cardiovascular;   Laterality: N/A;  . TEE WITHOUT CARDIOVERSION N/A 06/04/2017   Procedure: TRANSESOPHAGEAL ECHOCARDIOGRAM (TEE);  Surgeon: Prescott Gum, Collier Salina, MD;  Location: [facility];  Service: Open Heart Surgery;  Laterality: N/A;    There were no vitals filed for this visit.  Subjective Assessment - 05/08/19 1619    Subjective  Pt had Mohs surgery on Tuesday, March 2; pt states dizziness is getting much better - states it is going to take time - rates it 1/10 intensity at current time    Pertinent History  Panic disorder    Patient Stated Goals  resolve the vertigo    Currently in Pain?  No/denies           Neuro Re-ed:     Pt performed marching on incline and on decline - EO with no head movement, progressing to EO horizontal head turns 5 reps, Vertical head turns 5 reps:  EC no head movement marching in place, then added EC marching with horizontal head turns 5 reps and Vertical head turns 5 reps with min to mod assist for recovery of LOB        Pt performed amb. 30' forward - pivot turn and stop x 4 reps - pt reported no major increase in dizziness with this activity  Pt performed amb. 35' making circles clockwise, counterclockwise, "V" and "+" patterns x 1 rep each direction with SBA        Balance Exercises - 05/09/19 1702      Balance Exercises: Standing   Standing Eyes Opened  Wide (BOA);Head turns;Foam/compliant surface;5 reps    Standing Eyes Closed  Wide (BOA);Head turns;Foam/compliant surface;5 reps    Rockerboard  Anterior/posterior;EO;EC;Other reps (comment)   10 reps each - EO and EC   Other Standing Exercises  pt performed sit to stand with 180 degree turns on mat with CGA;  amb. tossing ball straight up 40' x 2 reps; amb. tossing ball on Rt and Lt sides 40' x 2 reps;  standing on blue mat - making circles clockwise, counterclockwise, "V" and "+" patterns 5 reps each with CGA  for balance          PT Short Term Goals - 05/09/19 1712      PT SHORT TERM GOAL #1    Title  Pt will improve DHI by at least 10 points to indicate improvement in dizziness and less disability due to vertigo.    Baseline  To be completed next session - pt forgot to bring [facility] today - 04-08-19;     40% on 04-15-19    Time  4    Period  Weeks    Status  On-going    Target Date  04/11/19      PT SHORT TERM GOAL #2   Title  Pt will subjectively report at least 25% improvement in vertigo.    Baseline  Pt reports dizziness is about the same; states the nausea is provoked with the exercises - 04-08-19; same 04-15-19    Time  4    Period  Weeks    Status  On-going    Target Date  04/11/19      PT SHORT TERM GOAL #3   Title  Pt will amb. 30' with horizontal head turns without LOB with c/o dizziness </= 3/10 intensity.    Baseline  3-4/10 - 04-08-19    Time  4    Period  Weeks    Status  Partially Met    Target Date  04/11/19      PT SHORT TERM GOAL #4   Title  Independent in HEP for balance and vestibular exercises.    Time  4    Period  Weeks    Status  Achieved    Target Date  04/11/19        PT Long Term Goals - 05/09/19 1712      PT LONG TERM GOAL #1   Title  Pt will improve DHI score by at least 20 points to indicate improvement in vertigo and less disability due to dizziness.    Time  8    Period  Weeks    Status  New      PT LONG TERM GOAL #2   Title  Pt will improve SOT composite score by at least 20 points to indicate improved balance and vestibular input.    Time  8    Period  Weeks    Status  New      PT LONG TERM GOAL #3   Title  Pt will have </= 2 line difference for DVA for improved VOR and gaze stabilization.    Time  8    Period  Weeks    Status  New      PT LONG TERM  GOAL #4   Title  Pt will report at least 50% improvement in vertigo.    Time  8    Period  Weeks    Status  New      PT LONG TERM GOAL #5   Title  Independent in updated HEP for balance and vestibular exercises.    Time  8    Period  Weeks    Status  New            Plan  - 05/09/19 1708    Clinical Impression Statement  Pt is progressing well towards goals; pt reported dizziness intensity at start of session 1/10 and reported increase to 3-3.5/10 with vestibular/gait activities.  Plan D/C next session    Personal Factors and Comorbidities  Behavior Pattern;Past/Current Experience;Comorbidity 1;Time since onset of injury/illness/exacerbation    Comorbidities  panic disorder, acute recurrent sinusitis, HTN, asthma    Examination-Activity Limitations  Transfers;Locomotion Level;Bend;Stand;Squat    Examination-Participation Restrictions  Community Activity;Driving;Shop;Yard Work;Cleaning;Other   carpentry projects - building a Scientist, research (medical)  Evolving/Moderate complexity    Rehab Potential  Good    PT Frequency  1x / week    PT Duration  8 weeks    PT Treatment/Interventions  ADLs/Self Care Home Management;Patient/family education;Vestibular;Therapeutic exercise;Therapeutic activities;Balance training;Neuromuscular re-education;Gait training;Stair training    PT Next Visit Plan  D/C next session - check LTG's - continue balance and vestibular exercises - gait with head turns, tossing ball, etc.    PT Home Exercise Plan  balance on foam, x1 viewing    Consulted and Agree with Plan of Care  Patient       Patient will benefit from skilled therapeutic intervention in order to improve the following deficits and impairments:  Dizziness, Decreased balance  Visit Diagnosis: Dizziness and giddiness  Unsteadiness on feet     Problem List Patient Active Problem List   Diagnosis Date Noted  . Hx of CABG 06/26/2017  . Dyslipidemia, goal LDL below 70 06/26/2017  . Anxiety 06/26/2017  . Essential hypertension 06/26/2017  . Coronary artery disease 06/04/2017  . Chest pain 05/23/2017    Alda Lea, PT 05/09/2019, 5:13 PM  [facility] [address] [place],  [place], [address] Phone: [phone]   Fax:  [phone]  Name: OMRAN KEELIN MRN: [id number] Date of Birth: [birth year]

## 2019-05-13 ENCOUNTER — Other Ambulatory Visit: Payer: Self-pay

## 2019-05-13 ENCOUNTER — Ambulatory Visit: Payer: No Typology Code available for payment source | Admitting: Physical Therapy

## 2019-05-13 DIAGNOSIS — R42 Dizziness and giddiness: Secondary | ICD-10-CM

## 2019-05-13 DIAGNOSIS — R2681 Unsteadiness on feet: Secondary | ICD-10-CM

## 2019-05-14 ENCOUNTER — Encounter: Payer: Self-pay | Admitting: Physical Therapy

## 2019-05-14 NOTE — Therapy (Signed)
[facility] [address] [place], [place], [address] Phone: [phone]   Fax:  [phone]  Physical Therapy Treatment & Discharge Summary  Patient Details  Name: Elijah Knapp MRN: [id number] Date of Birth: [birth year] Referring Provider (PT): Dr. Karna Dupes   Encounter Date: 05/13/2019  PT End of Session - 05/14/19 2146    Visit Number  9    Number of Visits  9   VA has approved 15 visits til 06-07-19   Date for PT Re-Evaluation  05/09/19    Authorization Type  VA    Authorization Time Period  02-07-19 - 06-07-19:  Referral # [id number]    Authorization - Visit Number  9    Authorization - Number of Visits  15    PT Start Time  1017    PT Stop Time  1100    PT Time Calculation (min)  43 min    Activity Tolerance  Patient tolerated treatment well    Behavior During Therapy  [facility] for tasks assessed/performed       Past Medical History:  Diagnosis Date  . Arthritis   . Asthma   . Chronic lung disease   . Complication of anesthesia    states he "stopped breathing" during knee surgery.   [name] COPD (chronic obstructive pulmonary disease) ([place])   . Coronary artery disease   . GERD (gastroesophageal reflux disease)   . Hypertension   . Leg cramps   . PTSD (post-traumatic stress disorder)   . Sleep apnea    uses cpap    Past Surgical History:  Procedure Laterality Date  . CORONARY ARTERY BYPASS GRAFT N/A 06/04/2017   Procedure: CORONARY ARTERY BYPASS GRAFTING (CABG) x 3 WITH ENDOSCOPIC HARVESTING OF RIGHT SAPHENOUS VEIN;  Surgeon: Ivin Poot, MD;  Location: [place];  Service: Open Heart Surgery;  Laterality: N/A;  . KNEE ARTHROSCOPY W/ MENISCAL REPAIR Right   . KNEE SURGERY Left   . LEFT HEART CATH AND CORONARY ANGIOGRAPHY N/A 05/28/2017   Procedure: LEFT HEART CATH AND CORONARY ANGIOGRAPHY;  Surgeon: Lorretta Harp, MD;  Location: [place] [facility];  Service: Cardiovascular;  Laterality: N/A;  . TEE  WITHOUT CARDIOVERSION N/A 06/04/2017   Procedure: TRANSESOPHAGEAL ECHOCARDIOGRAM (TEE);  Surgeon: Prescott Gum, Collier Salina, MD;  Location: [place];  Service: Open Heart Surgery;  Laterality: N/A;    There were no vitals filed for this visit.    Sensory Organization Test;  Composite score 69/100 (WNL's) N= 68/100  Somatosensory slightly decr. At 80/100 with N= 90/100 Visual and vestibular inputs WNL's  Condition 1 - trial 1 WNL's: trials 2 & 3 slightly below normal Condition 2 - all 3 trials slightly below N Condition 3 - all 3 trials WNL's Condition 4 - trials 1 & 2 slightly below N:  Trial 3 WNL's Condition 5 - trials 1 & 2 slightly below N: trial 3 WNL's Condition 6 - all 3 trials WNL's  Self Care; discussed LTG's and progress with pt; pt completed DHI - score 16% (mild impairment due to the vertigo); this has increased from his initial score Of 40% on the [facility]; compared results of SOT with previous test results (improvement on today's SOT)   Reviewed HEP - pt verbalized understanding and compliance                        PT Short Term Goals - 05/13/19 1023      PT SHORT  TERM GOAL #1   Title  Pt will improve DHI by at least 10 points to indicate improvement in dizziness and less disability due to vertigo.    Baseline  To be completed next session - pt forgot to bring [facility] today - 04-08-19;     40% on 04-15-19    Time  4    Period  Weeks    Status  On-going    Target Date  04/11/19      PT SHORT TERM GOAL #2   Title  Pt will subjectively report at least 25% improvement in vertigo.    Baseline  Pt reports dizziness is about the same; states the nausea is provoked with the exercises - 04-08-19; same 04-15-19    Time  4    Period  Weeks    Status  On-going    Target Date  04/11/19      PT SHORT TERM GOAL #3   Title  Pt will amb. 30' with horizontal head turns without LOB with c/o dizziness </= 3/10 intensity.    Baseline  3-4/10 - 04-08-19    Time  4    Period  Weeks     Status  Partially Met    Target Date  04/11/19      PT SHORT TERM GOAL #4   Title  Independent in HEP for balance and vestibular exercises.    Time  4    Period  Weeks    Status  Achieved    Target Date  04/11/19        PT Long Term Goals - 05/13/19 1023      PT LONG TERM GOAL #1   Title  Pt will improve DHI score by at least 20 points to indicate improvement in vertigo and less disability due to dizziness.    Baseline  score 16% on 05-13-19 (initial score 40%)    Time  8    Period  Weeks    Status  Achieved      PT LONG TERM GOAL #2   Title  Pt will improve SOT composite score by at least 20 points to indicate improved balance and vestibular input.    Time  8    Period  Weeks    Status  Partially Met      PT LONG TERM GOAL #3   Title  Pt will have </= 2 line difference for DVA for improved VOR and gaze stabilization.    Baseline  3 line difference on 05-13-19    Time  8    Period  Weeks    Status  Partially Met      PT LONG TERM GOAL #4   Title  Pt will report at least 50% improvement in vertigo.    Baseline  met 05-13-19    Time  8    Period  Weeks    Status  Achieved      PT LONG TERM GOAL #5   Title  Independent in updated HEP for balance and vestibular exercises.    Time  8    Period  Weeks    Status  Achieved            Plan - 05/14/19 2149    Clinical Impression Statement  Pt has met LTG's #1,4 and 5; LTG's #2 and 3 partially met as pt has improved but not fully met stated goal.  Significant improvement in vertigo reported by pt and also by [facility] score of increase  from 40% to 16% disability based on the dizziness (self-perceived disability rating scale).  Pt reports he is able to continue with HEP for continued resolution of the vertigo.  Pt is discharged due to goals met.    Personal Factors and Comorbidities  Behavior Pattern;Past/Current Experience;Comorbidity 1;Time since onset of injury/illness/exacerbation    Comorbidities  panic disorder, acute  recurrent sinusitis, HTN, asthma    Examination-Activity Limitations  Transfers;Locomotion Level;Bend;Stand;Squat    Examination-Participation Restrictions  Community Activity;Driving;Shop;Yard Work;Cleaning;Other   carpentry projects - building a Scientist, research (medical)  Evolving/Moderate complexity    Rehab Potential  Good    PT Frequency  1x / week    PT Duration  8 weeks    PT Treatment/Interventions  ADLs/Self Care Home Management;Patient/family education;Vestibular;Therapeutic exercise;Therapeutic activities;Balance training;Neuromuscular re-education;Gait training;Stair training    PT Next Visit Plan  D/C next session - check LTG's - continue balance and vestibular exercises - gait with head turns, tossing ball, etc.    PT Home Exercise Plan  balance on foam, x1 viewing    Consulted and Agree with Plan of Care  Patient       Patient will benefit from skilled therapeutic intervention in order to improve the following deficits and impairments:  Dizziness, Decreased balance  Visit Diagnosis: Dizziness and giddiness  Unsteadiness on feet     Problem List Patient Active Problem List   Diagnosis Date Noted  . Hx of CABG 06/26/2017  . Dyslipidemia, goal LDL below 70 06/26/2017  . Anxiety 06/26/2017  . Essential hypertension 06/26/2017  . Coronary artery disease 06/04/2017  . Chest pain 05/23/2017    PHYSICAL THERAPY DISCHARGE SUMMARY  Visits from Start of Care: 9  Current functional level related to goals / functional outcomes: See above for progress towards goals   Remaining deficits: Pt reports continued vertigo but of mild intensity (rates dizziness 1-2/10)   Education / Equipment: Pt has been instructed in HEP for balance and vestibular/habituation exercises and also for improved gaze stabilization. Plan: Patient agrees to discharge.  Patient goals were met. Patient is being discharged due to meeting the stated rehab goals.  ?????         Alda Lea, PT 05/14/2019, 9:55 PM  [facility] [address] [place], [place], [address] Phone: [phone]   Fax:  [phone]  Name: Elijah Knapp MRN: [id number] Date of Birth: March 09, 1955

## 2019-09-11 ENCOUNTER — Other Ambulatory Visit: Payer: Self-pay | Admitting: Otolaryngology

## 2019-09-17 ENCOUNTER — Telehealth: Payer: Self-pay | Admitting: Cardiovascular Disease

## 2019-09-17 NOTE — Telephone Encounter (Signed)
   [facility] Pre-operative Risk Assessment    HEARTCARE STAFF: - Please ensure there is not already an duplicate clearance open for this procedure. - Under Visit Info/Reason for Call, type in Other and utilize the format Clearance MM/DD/YY or Clearance TBD. Do not use dashes or single digits. - If request is for dental extraction, please clarify the # of teeth to be extracted.  Request for surgical clearance:  1. What type of surgery is being performed? Septoplasty and turbinate reduction   2. When is this surgery scheduled? 10/17/19  3. What type of clearance is required (medical clearance vs. Pharmacy clearance to hold med vs. Both)? Medical   4. Are there any medications that need to be held prior to surgery and how long? TBD by Dr. Gwenlyn Found   5. Practice name and name of physician performing surgery? [facility], Dr. Wilburn Cornelia   6. What is the office phone number? [phone]   7.   What is the office fax number? [phone]  8.   Anesthesia type (None, local, MAC, general) ? General    Trilby Drummer 09/17/2019, 3:43 PM  _________________________________________________________________   (provider comments below)

## 2019-09-17 NOTE — Telephone Encounter (Signed)
Pt has been ascheuled to see Kerin Ransom, NP 10/08/19. Will route back to the requesting surgeon's office to make them aware.

## 2019-09-17 NOTE — Telephone Encounter (Signed)
   Primary Cardiologist:No primary care provider on file.  Chart reviewed as part of pre-operative protocol coverage. Because of Elijah Knapp's past medical history and time since last visit, they will require a follow-up visit in order to better assess preoperative cardiovascular risk.  Pre-op covering staff: - Please schedule appointment and call patient to inform them. If patient already had an upcoming appointment within acceptable timeframe, please add "pre-op clearance" to the appointment notes so provider is aware. - Please contact requesting surgeon's office via preferred method (i.e, phone, fax) to inform them of need for appointment prior to surgery.  If applicable, this message will also be routed to pharmacy pool and/or primary cardiologist for input on holding anticoagulant/antiplatelet agent as requested below so that this information is available to the clearing provider at time of patient's appointment.   Kerin Ransom, PA-C  09/17/2019, 4:31 PM

## 2019-10-08 ENCOUNTER — Ambulatory Visit (INDEPENDENT_AMBULATORY_CARE_PROVIDER_SITE_OTHER): Payer: No Typology Code available for payment source | Admitting: Cardiology

## 2019-10-08 ENCOUNTER — Other Ambulatory Visit: Payer: Self-pay

## 2019-10-08 ENCOUNTER — Encounter: Payer: Self-pay | Admitting: Cardiology

## 2019-10-08 VITALS — BP 132/78 | HR 80 | Ht 67.0 in | Wt 165.0 lb

## 2019-10-08 DIAGNOSIS — I10 Essential (primary) hypertension: Secondary | ICD-10-CM

## 2019-10-08 DIAGNOSIS — Z79899 Other long term (current) drug therapy: Secondary | ICD-10-CM | POA: Diagnosis not present

## 2019-10-08 DIAGNOSIS — Z951 Presence of aortocoronary bypass graft: Secondary | ICD-10-CM

## 2019-10-08 DIAGNOSIS — Z01810 Encounter for preprocedural cardiovascular examination: Secondary | ICD-10-CM

## 2019-10-08 DIAGNOSIS — E785 Hyperlipidemia, unspecified: Secondary | ICD-10-CM

## 2019-10-08 DIAGNOSIS — Z01818 Encounter for other preprocedural examination: Secondary | ICD-10-CM | POA: Insufficient documentation

## 2019-10-08 NOTE — Progress Notes (Signed)
Cardiology Office Note:    Date:  10/08/2019   ID:  Elijah Knapp, DOB 07-21-1955, MRN [id number]  PCP:  [facility]  Cardiologist:  Dr Gwenlyn Found Electrophysiologist:  None   Referring MD: [facility]   No chief complaint on file. Pre op clearance  History of Present Illness:    Elijah Knapp is a 64 y.o. male followed by the [place].  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 He was referred to Dr Gwenlyn Found in March 2019 after the patient had an ED visit with chest pain.   Based on his symptoms he was set up for OP angiogram. This was done 05/28/17 and revealed high grade CFX, OM, LAD, and Dx disease. His RCA had a 40% narrowing. LVF was normal. He was seen in consul;t by Dr Nils Pyle and underwent CABG x 3 on 06/04/17. His hospital course was complicated by anxiety and he was treated with Xanax. He otherwise had an unremarkable post operative course.  We last saw him in Dec 2019.  He has been followed by the [place] but says he hasn't had any cardiac testing.  He has done well from a cardiac standpoint, walking at least three times a week for exercise.  He denies any chest pain, SOB, or NTG use. He has been worked up for "sleep deprivation" and was found to have sinus obstruction.  He needs septoplasty and turbinate reduction.  He was referred her for cardiac clearance.   Past Medical History:  Diagnosis Date  . Arthritis   . Asthma   . Chronic lung disease   . Complication of anesthesia    states he "stopped breathing" during knee surgery.   [name] COPD (chronic obstructive pulmonary disease) ([address])   . Coronary artery disease   . GERD (gastroesophageal reflux disease)   . Hypertension   . Leg cramps   . PTSD (post-traumatic stress disorder)   . Sleep apnea    uses cpap    Past Surgical History:  Procedure Laterality Date  . CORONARY ARTERY BYPASS GRAFT N/A 06/04/2017   Procedure: CORONARY ARTERY BYPASS GRAFTING (CABG) x 3 WITH ENDOSCOPIC HARVESTING OF  RIGHT SAPHENOUS VEIN;  Surgeon: Ivin Poot, MD;  Location: [place];  Service: Open Heart Surgery;  Laterality: N/A;  . KNEE ARTHROSCOPY W/ MENISCAL REPAIR Right   . KNEE SURGERY Left   . LEFT HEART CATH AND CORONARY ANGIOGRAPHY N/A 05/28/2017   Procedure: LEFT HEART CATH AND CORONARY ANGIOGRAPHY;  Surgeon: Lorretta Harp, MD;  Location: [facility];  Service: Cardiovascular;  Laterality: N/A;  . TEE WITHOUT CARDIOVERSION N/A 06/04/2017   Procedure: TRANSESOPHAGEAL ECHOCARDIOGRAM (TEE);  Surgeon: Prescott Gum, Collier Salina, MD;  Location: [facility];  Service: Open Heart Surgery;  Laterality: N/A;    Current Medications: Current Meds  Medication Sig  . albuterol (VENTOLIN HFA) 108 (90 Base) MCG/ACT inhaler Inhale into the lungs.  . ALPRAZolam (XANAX) 0.5 MG tablet Take by mouth.  [name] amoxicillin-clavulanate (AUGMENTIN) 875-125 MG tablet Take by mouth.  [name] atorvastatin (LIPITOR) 20 MG tablet Take 20 mg by mouth daily.  . budesonide-formoterol (SYMBICORT) 80-4.5 MCG/ACT inhaler Inhale into the lungs.  . cefUROXime (CEFTIN) 250 MG tablet Take 250 mg by mouth every morning.  . cholecalciferol (VITAMIN D3) 25 MCG (1000 UNIT) tablet Take 1,000 Units by mouth daily.  . finasteride (PROSCAR) 5 MG tablet Take 2.5 mg by mouth daily.  . fluocinonide ointment (LIDEX) 0.05 %  Apply topically.  . fluticasone (FLONASE) 50 MCG/ACT nasal spray 1 spray by Each Nare route.  . hydrocortisone 2.5 % ointment Apply topically.  [name] losartan (COZAAR) 100 MG tablet   . metoprolol tartrate (LOPRESSOR) 50 MG tablet Take 50 mg by mouth 2 (two) times daily.   [name] omeprazole (PRILOSEC) 20 MG capsule Take 1 capsule (20 mg total) by mouth daily.  . sildenafil (VIAGRA) 100 MG tablet Take by mouth.     Allergies:   Patient has no known allergies.   Social History   Socioeconomic History  . Marital status: Single    Spouse name: Not on file  . Number of children: Not on file  . Years of education: Not on file  . Highest  education level: Not on file  Occupational History  . Not on file  Tobacco Use  . Smoking status: Never Smoker  . Smokeless tobacco: Never Used  Vaping Use  . Vaping Use: Never used  Substance and Sexual Activity  . Alcohol use: Yes    Alcohol/week: 21.0 standard drinks    Types: 7 Glasses of wine, 14 Cans of beer per week  . Drug use: Never  . Sexual activity: Not on file  Other Topics Concern  . Not on file  Social History Narrative  . Not on file   Social Determinants of Health   Financial Resource Strain:   . Difficulty of Paying Living Expenses:   Food Insecurity:   . Worried About Charity fundraiser in the Last Year:   . Arboriculturist in the Last Year:   Transportation Needs:   . Film/video editor (Medical):   [name] Lack of Transportation (Non-Medical):   Physical Activity:   . Days of Exercise per Week:   . Minutes of Exercise per Session:   Stress:   . Feeling of Stress :   Social Connections:   . Frequency of Communication with Friends and Family:   . Frequency of Social Gatherings with Friends and Family:   . Attends Religious Services:   . Active Member of Clubs or Organizations:   . Attends Archivist Meetings:   [name] Marital Status:      Family History: The patient's family history includes Alzheimer's disease in his mother; Heart disease in his father.  ROS:   Please see the history of present illness.     All other systems reviewed and are negative.  EKGs/Labs/Other Studies Reviewed:    The following studies were reviewed today:   EKG:  EKG is ordered today.  The ekg ordered today demonstrates NSR, HR 80  Recent Labs: No results found for requested labs within last 8760 hours.  Recent Lipid Panel No results found for: CHOL, TRIG, HDL, CHOLHDL, VLDL, LDLCALC, LDLDIRECT  Physical Exam:    VS:  BP 132/78   Pulse 80   Ht 5\' 7"  (1.702 m)   Wt 165 lb (74.8 kg)   SpO2 96%   BMI 25.84 kg/m     Wt Readings from Last 3  Encounters:  10/08/19 165 lb (74.8 kg)  02/26/18 160 lb (72.6 kg)  10/17/17 164 lb (74.4 kg)     GEN: Well nourished, well developed in no acute distress HEENT: Normal NECK: No JVD; No carotid bruits LYMPHATICS: No lymphadenopathy CARDIAC: RRR, no murmurs, rubs, gallops RESPIRATORY:  Clear to auscultation without rales, wheezing or rhonchi  ABDOMEN: Soft, non-tender, non-distended MUSCULOSKELETAL:  No edema; No deformity  SKIN: Warm and dry NEUROLOGIC:  Alert and oriented x 3 PSYCHIATRIC:  Normal affect   ASSESSMENT:    Pre-operative cardiovascular examination Pre op clearance for ENT surgery  Hx of CABG S/P CABG x 3 06/04/17- LIMA-LAD, SVG-Dx, SVG-OM No recent angina  Dyslipidemia, goal LDL below 70 Check fasting lipids and CMET (no labs in > 1 year)  Essential hypertension Controlled  PLAN:    The patient is an acceptable risk for the proposed procedure without further cardiac work up. I will forward clearance to the operating surgeon.     F/U Dr Gwenlyn Found in 6 months.    Medication Adjustments/Labs and Tests Ordered: Current medicines are reviewed at length with the patient today.  Concerns regarding medicines are outlined above.  Orders Placed This Encounter  Procedures  . CBC  . Comprehensive Metabolic Panel (CMET)  . Lipid panel  . EKG 12-Lead   No orders of the defined types were placed in this encounter.   Patient Instructions  Medication Instructions:  Continue current medications  *If you need a refill on your cardiac medications before your next appointment, please call your pharmacy*   Lab Work: Fasting Lipid, CMP and CBC  If you have labs (blood work) drawn today and your tests are completely normal, you will receive your results only by: [name] MyChart Message (if you have MyChart) OR . A paper copy in the mail If you have any lab test that is abnormal or we need to change your treatment, we will call you to review the  results.   Testing/Procedures: None Ordered   Follow-Up: At [facility], you and your health needs are our priority.  As part of our continuing mission to provide you with exceptional heart care, we have created designated Provider Care Teams.  These Care Teams include your primary Cardiologist (physician) and Advanced Practice Providers (APPs -  Physician Assistants and Nurse Practitioners) who all work together to provide you with the care you need, when you need it.  We recommend signing up for the patient portal called "MyChart".  Sign up information is provided on this After Visit Summary.  MyChart is used to connect with patients for Virtual Visits (Telemedicine).  Patients are able to view lab/test results, encounter notes, upcoming appointments, etc.  Non-urgent messages can be sent to your provider as well.   To learn more about what you can do with MyChart, go to [email or web address].    Your next appointment:   6 month(s)  The format for your next appointment:   In Person  Provider:   You may see Quay Burow, MD or one of the following Advanced Practice Providers on your designated Care Team:    Kerin Ransom, PA-C  [place], [place]  Coletta Memos, FNP        Signed, Kerin Ransom, [place]  10/08/2019 11:46 AM    [place]

## 2019-10-08 NOTE — Progress Notes (Addendum)
[id number] [address] [place] [address] Phone: [phone] Fax: [phone]      Your procedure is scheduled on 10/17/19.  Report to [facility] Main Entrance "A" at 5:30 A.M., and check in at the Admitting office.  Call this number if you have problems the morning of surgery:  [phone]  Call [phone] if you have any questions prior to your surgery date Monday-Friday 8am-4pm    Remember:  Do not eat or drink after midnight the night before your surgery   Take these medicines the morning of surgery with A SIP OF WATER: albuterol (VENTOLIN HFA) - as needed ALPRAZolam Duanne Moron) -as needed atorvastatin (LIPITOR) budesonide-formoterol (SYMBICORT) -as needed finasteride (PROSCAR) fluticasone (FLONASE) - as needed  metoprolol tartrate (LOPRESSOR) omeprazole (PRILOSEC)  As of today, STOP taking any Aspirin (unless otherwise instructed by your surgeon) Aleve, Naproxen, Ibuprofen, Motrin, Advil, Goody's, BC's, all herbal medications, fish oil, and all vitamins.                      Do not wear jewelry            Do not wear lotions, powders, colognes, or deodorant.            Do not shave 48 hours prior to surgery.  Men may shave face and neck.            Do not bring valuables to the hospital.            [facility] is not responsible for any belongings or valuables.  Do NOT Smoke (Tobacco/Vaping) or drink Alcohol 24 hours prior to your procedure If you use a CPAP at night, you may bring all equipment for your overnight stay.   Contacts, glasses, dentures or bridgework may not be worn into surgery.      For patients admitted to the hospital, discharge time will be determined by your treatment team.   Patients discharged the day of surgery will not be allowed to drive home, and someone needs to stay with them for 24 hours.    Special instructions:   [facility]- Preparing For Surgery  Before surgery, you  can play an important role. Because skin is not sterile, your skin needs to be as free of germs as possible. You can reduce the number of germs on your skin by washing with CHG (chlorahexidine gluconate) Soap before surgery.  CHG is an antiseptic cleaner which kills germs and bonds with the skin to continue killing germs even after washing.    Oral Hygiene is also important to reduce your risk of infection.  Remember - BRUSH YOUR TEETH THE MORNING OF SURGERY WITH YOUR REGULAR TOOTHPASTE  Please do not use if you have an allergy to CHG or antibacterial soaps. If your skin becomes reddened/irritated stop using the CHG.  Do not shave (including legs and underarms) for at least 48 hours prior to first CHG shower. It is OK to shave your face.  Please follow these instructions carefully.   1. Shower the NIGHT BEFORE SURGERY and the MORNING OF SURGERY with CHG Soap.   2. If you chose to wash your hair, wash your hair first as usual with your normal shampoo.  3. After you shampoo, rinse your hair and body thoroughly to remove the shampoo.  4. Use CHG as you would any other liquid soap. You can apply CHG directly to the skin and wash gently with  a scrungie or a clean washcloth.   5. Apply the CHG Soap to your body ONLY FROM THE NECK DOWN.  Do not use on open wounds or open sores. Avoid contact with your eyes, ears, mouth and genitals (private parts). Wash Face and genitals (private parts)  with your normal soap.   6. Wash thoroughly, paying special attention to the area where your surgery will be performed.  7. Thoroughly rinse your body with warm water from the neck down.  8. DO NOT shower/wash with your normal soap after using and rinsing off the CHG Soap.  9. Pat yourself dry with a CLEAN TOWEL.  10. Wear CLEAN PAJAMAS to bed the night before surgery  11. Place CLEAN SHEETS on your bed the night of your first shower and DO NOT SLEEP WITH PETS.   Day of Surgery: Wear Clean/Comfortable  clothing the morning of surgery Do not apply any deodorants/lotions.   Remember to brush your teeth WITH YOUR REGULAR TOOTHPASTE.   Please read over the following fact sheets that you were given.

## 2019-10-08 NOTE — Telephone Encounter (Signed)
   Primary Cardiologist: Dr Gwenlyn Found  Chart reviewed and patient seen and examined in the office today as part of pre-operative protocol coverage. Given past medical history and time since last visit, based on ACC/AHA guidelines, Elijah Knapp would be at acceptable risk for the planned procedure without further cardiovascular testing.   OK to hold aspirin 5-7 days pre op, resume as soon as safe post op.   I will route this recommendation to the requesting party via Epic fax function and remove from pre-op pool.  Please call with questions.  Kerin Ransom, PA-C 10/08/2019, 11:51 AM

## 2019-10-08 NOTE — Assessment & Plan Note (Signed)
Check fasting lipids and CMET (no labs in > 1 year)

## 2019-10-08 NOTE — Assessment & Plan Note (Signed)
Pre op clearance for ENT surgery

## 2019-10-08 NOTE — Assessment & Plan Note (Signed)
Controlled.  

## 2019-10-08 NOTE — Patient Instructions (Signed)
Medication Instructions:  Continue current medications  *If you need a refill on your cardiac medications before your next appointment, please call your pharmacy*   Lab Work: Fasting Lipid, CMP and CBC  If you have labs (blood work) drawn today and your tests are completely normal, you will receive your results only by: [name] MyChart Message (if you have MyChart) OR . A paper copy in the mail If you have any lab test that is abnormal or we need to change your treatment, we will call you to review the results.   Testing/Procedures: None Ordered   Follow-Up: At [facility], you and your health needs are our priority.  As part of our continuing mission to provide you with exceptional heart care, we have created designated Provider Care Teams.  These Care Teams include your primary Cardiologist (physician) and Advanced Practice Providers (APPs -  Physician Assistants and Nurse Practitioners) who all work together to provide you with the care you need, when you need it.  We recommend signing up for the patient portal called "MyChart".  Sign up information is provided on this After Visit Summary.  MyChart is used to connect with patients for Virtual Visits (Telemedicine).  Patients are able to view lab/test results, encounter notes, upcoming appointments, etc.  Non-urgent messages can be sent to your provider as well.   To learn more about what you can do with MyChart, go to [email or web address].    Your next appointment:   6 month(s)  The format for your next appointment:   In Person  Provider:   You may see Quay Burow, MD or one of the following Advanced Practice Providers on your designated Care Team:    Kerin Ransom, PA-C  [place], [place]  Coletta Memos, [place]

## 2019-10-08 NOTE — Assessment & Plan Note (Signed)
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

## 2019-10-09 ENCOUNTER — Encounter (HOSPITAL_COMMUNITY)
Admission: RE | Admit: 2019-10-09 | Discharge: 2019-10-09 | Disposition: A | Discharge status: Home / Self Care | Payer: No Typology Code available for payment source | Source: Ambulatory Visit | Attending: Otolaryngology | Admitting: Otolaryngology

## 2019-10-09 ENCOUNTER — Other Ambulatory Visit: Payer: Self-pay

## 2019-10-09 ENCOUNTER — Encounter (HOSPITAL_COMMUNITY): Payer: Self-pay

## 2019-10-09 DIAGNOSIS — Z01812 Encounter for preprocedural laboratory examination: Secondary | ICD-10-CM | POA: Insufficient documentation

## 2019-10-09 HISTORY — DX: Malignant (primary) neoplasm, unspecified: C80.1

## 2019-10-09 LAB — BASIC METABOLIC PANEL
Anion gap: 9 (ref 5–15)
BUN: 14 mg/dL (ref 8–23)
CO2: 26 mmol/L (ref 22–32)
Calcium: 9.3 mg/dL (ref 8.9–10.3)
Chloride: 104 mmol/L (ref 98–111)
Creatinine, Ser: 1.17 mg/dL (ref 0.61–1.24)
GFR calc Af Amer: >60 mL/min (ref >60)
GFR calc non Af Amer: >60 mL/min (ref >60)
Glucose, Bld: 128 mg/dL — ABNORMAL HIGH (ref 70–99)
Potassium: 4.1 mmol/L (ref 3.5–5.1)
Sodium: 139 mmol/L (ref 135–145)

## 2019-10-09 LAB — CBC
HCT: 40.1 % (ref 39.0–52.0)
Hemoglobin: 13.2 g/dL (ref 13.0–17.0)
MCH: 31.2 pg (ref 26.0–34.0)
MCHC: 32.9 g/dL (ref 30.0–36.0)
MCV: 94.8 fL (ref 80.0–100.0)
Platelets: 236 10*3/uL (ref 150–400)
RBC: 4.23 MIL/uL (ref 4.22–5.81)
RDW: 12.4 % (ref 11.5–15.5)
WBC: 4.8 10*3/uL (ref 4.0–10.5)
nRBC: 0 % (ref 0.0–0.2)

## 2019-10-09 NOTE — Progress Notes (Signed)
PCP:  [facility], [place] Cardiologist:  Lorie Phenix, MD  EKG:  10/08/19 CXR:  07/11/17 ECHO:  06/04/17 Stress Test:  Denies Cardiac Cath:  05/28/17  Anesthesia Review:  Yes, CABG 06/04/17  Covid test 10/15/19  Patient denies shortness of breath, fever, cough, and chest pain at PAT appointment.  Patient verbalized understanding of instructions provided today at the PAT appointment.  Patient asked to review instructions at home and day of surgery.

## 2019-10-10 NOTE — Progress Notes (Signed)
Anesthesia Chart Review:  Case: [id number] Date/Time: 10/17/19 0715   Procedure: NASAL SEPTOPLASTY WITH TURBINATE REDUCTION (Bilateral )   Anesthesia type: General   Pre-op diagnosis: Deviated septum   Location: [facility] OR ROOM 09 / [place] [facility]   Surgeons: [name], MD      DISCUSSION: Patient is a 64 year old male scheduled for the above procedure.  He has OSA and has had difficulty wearing his device because of nasal airway obstruction with right-sided blockage and intermittent left-sided nasal congestion which is worse at night.  History includes never smoker, HTN, CAD (s/p CABG: LIMA-LAD, SVG-DIAG, SVG-CX 06/04/17), COPD, asthma, OSA (CPAP), PTSD, GERD, skin cancer (BCC), knee surgery (right knee arthroscopy 04/29/08). He reported a prior knee surgery at the [facility] in which he "stopped breathing." Per previous anesthesia APP documentation in 2019, it sounded like it may have been a combination of sedation and OSA, as he told his PAT RN that he was told to use his CPAP.   Last cardiology evaluation 10/08/2019 by Kerin Ransom, PA-C for preoperative evaluation.  He had not been seen there since December 2019, but have been doing well from a cardiac standpoint.  He was walking at least 3 times a week for exercise.  No chest pain or shortness of breath.  Patient was felt to be "acceptable risk for proposed procedure without further cardiac work-up".  Preoperative Covid test is scheduled for 10/15/2019.  Anesthesia team to evaluate on the day of surgery.   VS: BP 122/63   Pulse 72   Temp 36.7 C (Oral)   Resp 17   Ht 5\' 7"  (1.702 m)   Wt 74.5 kg   SpO2 97%   BMI 25.72 kg/m     PROVIDERS: [facility] is where he receives primary care Quay Burow, MD is cardiologist   LABS: Labs reviewed: Acceptable for surgery. (all labs ordered are listed, but only abnormal results are displayed)  Labs Reviewed  BASIC METABOLIC PANEL - Abnormal; Notable for the following  components:      Result Value   Glucose, Bld 128 (*)    All other components within normal limits  CBC    PFTs 06/01/17: FVC 3.07 (74%), post 3.55 (85%). FEV1 1.48 (47%), post 1.70 (54%). DLCO unc 24.21 (85%), cor 24.01 (84%).     EKG: 10/08/19: NSR   CV: TEE (Intra-operative TEE for CABG) 06/04/17:  Mitral valve: Trace regurgitation.   Aortic valve: The valve is trileaflet. No stenosis. No regurgitation.   Right ventricle: Normal cavity size and ejection fraction.   Left ventricle: No significant regional wall motion abnormalities. LVEF:  50-55%.  - Tricuspid, Pulmonic, Mitral and Aortic valve unchanged. No significant  regional wall motion abnormalities. CO 4.2 with pressor support. LVEF  unchanged. Aortic cannula removed, no dissection present.    Carotid U/S 06/01/17: - Right Carotid: Velocities in the right ICA are consistent with a 1-39%  stenosis.  - Left Carotid: Velocities in the left ICA are consistent with a 1-39% stenosis.  - Vertebrals: Bilateral vertebral arteries demonstrate antegrade flow.  - Subclavians: Normal flow hemodynamics were seen in bilateral subclavian arteries.    Cardiac cath (Pre-CABG) 05/28/17:  Prox LAD to Mid LAD lesion is 95% stenosed.  Ost 1st Diag to 1st Diag lesion is 80% stenosed.  Mid LAD lesion is 70% stenosed.  Ost 1st Mrg to 1st Mrg lesion is 90% stenosed.  Mid Cx to Dist Cx lesion is 90% stenosed.  Dist RCA lesion  is 40% stenosed.  The left ventricular systolic function is normal.  LV end diastolic pressure is normal.  The left ventricular ejection fraction is 55-65% by visual estimate. - s/p CABG: LIMA-LAD, SVG-DIAG, SVG-CX 06/04/17    Past Medical History:  Diagnosis Date  . Arthritis   . Asthma   . Cancer ([place])    basal cell skin ca  . Chronic lung disease   . Complication of anesthesia    states he "stopped breathing" during knee surgery.   [name] COPD (chronic obstructive pulmonary disease) ([place])   . Coronary  artery disease   . GERD (gastroesophageal reflux disease)   . Hypertension   . Leg cramps   . PTSD (post-traumatic stress disorder)   . Sleep apnea    uses cpap    Past Surgical History:  Procedure Laterality Date  . CORONARY ARTERY BYPASS GRAFT N/A 06/04/2017   Procedure: CORONARY ARTERY BYPASS GRAFTING (CABG) x 3 WITH ENDOSCOPIC HARVESTING OF RIGHT SAPHENOUS VEIN;  Surgeon: Ivin Poot, MD;  Location: [place];  Service: Open Heart Surgery;  Laterality: N/A;  . KNEE ARTHROSCOPY W/ MENISCAL REPAIR Right   . KNEE SURGERY Left   . LEFT HEART CATH AND CORONARY ANGIOGRAPHY N/A 05/28/2017   Procedure: LEFT HEART CATH AND CORONARY ANGIOGRAPHY;  Surgeon: Lorretta Harp, MD;  Location: [place] [facility];  Service: Cardiovascular;  Laterality: N/A;  . TEE WITHOUT CARDIOVERSION N/A 06/04/2017   Procedure: TRANSESOPHAGEAL ECHOCARDIOGRAM (TEE);  Surgeon: Prescott Gum, Collier Salina, MD;  Location: [place];  Service: Open Heart Surgery;  Laterality: N/A;    MEDICATIONS: . albuterol (VENTOLIN HFA) 108 (90 Base) MCG/ACT inhaler  . ALPRAZolam (XANAX) 0.5 MG tablet  . amoxicillin-clavulanate (AUGMENTIN) 875-125 MG tablet  . atorvastatin (LIPITOR) 20 MG tablet  . budesonide-formoterol (SYMBICORT) 80-4.5 MCG/ACT inhaler  . cefUROXime (CEFTIN) 250 MG tablet  . cholecalciferol (VITAMIN D3) 25 MCG (1000 UNIT) tablet  . finasteride (PROSCAR) 5 MG tablet  . fluocinonide ointment (LIDEX) 0.05 %  . fluticasone (FLONASE) 50 MCG/ACT nasal spray  . hydrocortisone 2.5 % ointment  . losartan (COZAAR) 100 MG tablet  . metoprolol tartrate (LOPRESSOR) 50 MG tablet  . omeprazole (PRILOSEC) 20 MG capsule  . sildenafil (VIAGRA) 100 MG tablet   No current facility-administered medications for this encounter.    Myra Gianotti, PA-C Surgical Short Stay/Anesthesiology [facility] Phone [phone] [facility] Phone [phone] 10/10/2019 1:28 PM

## 2019-10-10 NOTE — Anesthesia Preprocedure Evaluation (Addendum)
Anesthesia Evaluation  Patient identified by MRN, date of birth, ID band Patient awake    Reviewed: Allergy & Precautions, NPO status , Patient's Chart, lab work & pertinent test results  Airway Mallampati: I  TM Distance: >3 FB Neck ROM: Full    Dental   Pulmonary sleep apnea , COPD,    Pulmonary exam normal        Cardiovascular hypertension, Pt. on medications Normal cardiovascular exam     Neuro/Psych Anxiety    GI/Hepatic GERD  Medicated and Controlled,  Endo/Other    Renal/GU      Musculoskeletal   Abdominal   Peds  Hematology   Anesthesia Other Findings   Reproductive/Obstetrics                             Anesthesia Physical Anesthesia Plan  ASA: III  Anesthesia Plan: General   Post-op Pain Management:    Induction: Intravenous  PONV Risk Score and Plan: 2 and Ondansetron and Midazolam  Airway Management Planned: Oral ETT  Additional Equipment:   Intra-op Plan:   Post-operative Plan: Extubation in OR  Informed Consent: I have reviewed the patients History and Physical, chart, labs and discussed the procedure including the risks, benefits and alternatives for the proposed anesthesia with the patient or authorized representative who has indicated his/her understanding and acceptance.       Plan Discussed with: CRNA and Surgeon  Anesthesia Plan Comments: (PAT note written 10/10/2019 by Myra Gianotti, PA-C. )       Anesthesia Quick Evaluation

## 2019-10-15 ENCOUNTER — Other Ambulatory Visit: Payer: Self-pay

## 2019-10-15 ENCOUNTER — Other Ambulatory Visit: Payer: Self-pay | Admitting: Cardiothoracic Surgery

## 2019-10-15 ENCOUNTER — Ambulatory Visit
Admission: RE | Admit: 2019-10-15 | Discharge: 2019-10-15 | Disposition: A | Discharge status: Home / Self Care | Payer: Non-veteran care | Source: Ambulatory Visit | Attending: Cardiothoracic Surgery | Admitting: Cardiothoracic Surgery

## 2019-10-15 ENCOUNTER — Ambulatory Visit (INDEPENDENT_AMBULATORY_CARE_PROVIDER_SITE_OTHER): Payer: No Typology Code available for payment source | Admitting: Cardiothoracic Surgery

## 2019-10-15 ENCOUNTER — Encounter: Payer: Self-pay | Admitting: Cardiothoracic Surgery

## 2019-10-15 ENCOUNTER — Other Ambulatory Visit (HOSPITAL_COMMUNITY)
Admission: RE | Admit: 2019-10-15 | Discharge: 2019-10-15 | Disposition: A | Discharge status: Home / Self Care | Payer: No Typology Code available for payment source | Source: Ambulatory Visit | Attending: Otolaryngology | Admitting: Otolaryngology

## 2019-10-15 VITALS — BP 120/76 | HR 77 | Temp 97.7°F | Resp 20 | Ht 67.0 in | Wt 165.0 lb

## 2019-10-15 DIAGNOSIS — Z01812 Encounter for preprocedural laboratory examination: Secondary | ICD-10-CM | POA: Insufficient documentation

## 2019-10-15 DIAGNOSIS — Z951 Presence of aortocoronary bypass graft: Secondary | ICD-10-CM

## 2019-10-15 DIAGNOSIS — Z20822 Contact with and (suspected) exposure to covid-19: Secondary | ICD-10-CM | POA: Insufficient documentation

## 2019-10-15 DIAGNOSIS — I2510 Atherosclerotic heart disease of native coronary artery without angina pectoris: Secondary | ICD-10-CM

## 2019-10-15 DIAGNOSIS — T8189XA Other complications of procedures, not elsewhere classified, initial encounter: Secondary | ICD-10-CM | POA: Insufficient documentation

## 2019-10-15 LAB — SARS CORONAVIRUS 2 (TAT 6-24 HRS): SARS Coronavirus 2: NEGATIVE

## 2019-10-15 NOTE — Progress Notes (Signed)
PCP is [facility] Referring Provider is Lorretta Harp, MD  Chief Complaint  Patient presents with  . Coronary Artery Disease    CXR- today, HX of CABG 06/04/2017, c/o tender knot on sternal incison site   Patient examined and today's PA and lateral chest x-ray personally reviewed HPI: Patient presents for evaluation of tender sternal wires at the upper border of his sternal incision.  The patient had emergency CABG x3 over 18 months ago. His cardiac status is excellent but he has sensitive and tender but intact skin over the upper 2 sternal wires due to a paucity of subcutaneous fat.  The remainder the sternal incision is fine.  On the lateral x-ray the end of the upper sternal wires appear to be somewhat protruding.  I discussed with the patient that he could have this resolved with outpatient removal of the upper 2 sternal wires under anesthesia at [facility] outpatient surgery.  Past Medical History:  Diagnosis Date  . Arthritis   . Asthma   . Cancer ([place])    basal cell skin ca  . Chronic lung disease   . Complication of anesthesia    states he "stopped breathing" during knee surgery.   [name] COPD (chronic obstructive pulmonary disease) ([place])   . Coronary artery disease   . GERD (gastroesophageal reflux disease)   . Hypertension   . Leg cramps   . PTSD (post-traumatic stress disorder)   . Sleep apnea    uses cpap    Past Surgical History:  Procedure Laterality Date  . CORONARY ARTERY BYPASS GRAFT N/A 06/04/2017   Procedure: CORONARY ARTERY BYPASS GRAFTING (CABG) x 3 WITH ENDOSCOPIC HARVESTING OF RIGHT SAPHENOUS VEIN;  Surgeon: Ivin Poot, MD;  Location: [place];  Service: Open Heart Surgery;  Laterality: N/A;  . KNEE ARTHROSCOPY W/ MENISCAL REPAIR Right   . KNEE SURGERY Left   . LEFT HEART CATH AND CORONARY ANGIOGRAPHY N/A 05/28/2017   Procedure: LEFT HEART CATH AND CORONARY ANGIOGRAPHY;  Surgeon: Lorretta Harp, MD;  Location: [place] [facility];   Service: Cardiovascular;  Laterality: N/A;  . TEE WITHOUT CARDIOVERSION N/A 06/04/2017   Procedure: TRANSESOPHAGEAL ECHOCARDIOGRAM (TEE);  Surgeon: Prescott Gum, Collier Salina, MD;  Location: [place];  Service: Open Heart Surgery;  Laterality: N/A;    Family History  Problem Relation Age of Onset  . Alzheimer's disease Mother   . Heart disease Father     Social History Social History   Tobacco Use  . Smoking status: Never Smoker  . Smokeless tobacco: Never Used  Vaping Use  . Vaping Use: Never used  Substance Use Topics  . Alcohol use: Yes    Alcohol/week: 21.0 standard drinks    Types: 7 Glasses of wine, 14 Cans of beer per week  . Drug use: Never    Current Outpatient Medications  Medication Sig Dispense Refill  . atorvastatin (LIPITOR) 20 MG tablet Take 20 mg by mouth daily.    . metoprolol tartrate (LOPRESSOR) 50 MG tablet Take 50 mg by mouth 2 (two) times daily.     [name] omeprazole (PRILOSEC) 20 MG capsule Take 1 capsule (20 mg total) by mouth daily.    [name] albuterol (VENTOLIN HFA) 108 (90 Base) MCG/ACT inhaler Inhale into the lungs. (Patient not taking: Reported on 10/15/2019)    . ALPRAZolam (XANAX) 0.5 MG tablet Take by mouth. (Patient not taking: Reported on 10/15/2019)    . amoxicillin-clavulanate (AUGMENTIN) 875-125 MG tablet Take by mouth.    [name]  cefUROXime (CEFTIN) 250 MG tablet Take 250 mg by mouth every morning. (Patient not taking: Reported on 10/15/2019)    . cholecalciferol (VITAMIN D3) 25 MCG (1000 UNIT) tablet Take 1,000 Units by mouth daily. (Patient not taking: Reported on 10/15/2019)    . finasteride (PROSCAR) 5 MG tablet Take 2.5 mg by mouth daily. (Patient not taking: Reported on 10/15/2019)    . fluocinonide ointment (LIDEX) 0.05 % Apply topically. (Patient not taking: Reported on 10/15/2019)    . fluticasone (FLONASE) 50 MCG/ACT nasal spray 1 spray by Each Nare route. (Patient not taking: Reported on 10/15/2019)    . hydrocortisone 2.5 % ointment Apply topically. (Patient not  taking: Reported on 10/15/2019)    . losartan (COZAAR) 100 MG tablet  (Patient not taking: Reported on 10/15/2019)    . sildenafil (VIAGRA) 100 MG tablet Take by mouth. (Patient not taking: Reported on 10/15/2019)     No current facility-administered medications for this visit.    No Known Allergies  Review of Systems  The patient is scheduled to have elective nasal surgery by Dr. Lovenia Shuck day after tomorrow. No recurrent angina or symptoms of CHF after CABG 2019 No shortness of breath No palpitations Occultly breathing because of his nasal deformity No edema  BP 120/76   Pulse 77   Temp 97.7 F (36.5 C) (Skin)   Resp 20   Ht 5\' 7"  (1.702 m)   Wt 165 lb (74.8 kg)   SpO2 94% Comment: RA  BMI 25.84 kg/m  Physical Exam      Exam    General- alert and comfortable.  Sternal incision well-healed but upper portion with tender sternal wire somewhat protruding into the skin.  The skin is intact.    Neck- no JVD, no cervical adenopathy palpable, no carotid bruit   Lungs- clear without rales, wheezes   Cor- regular rate and rhythm, no murmur , gallop   Abdomen- soft, non-tender   Extremities - warm, non-tender, minimal edema   Neuro- oriented, appropriate, no focal weakness   Diagnostic Tests: Chest x-ray personally viewed with results as noted above  Impression: Tender sternal wires.  These can be removed as an outpatient under anesthesia.  He is having ENT surgery this week so we will set him up to be seen back in the office in about a month to set up his outpatient sternal wire removal  Plan: Return in 1 month for discussion of sternal wire removal and timing of surgery   Len Childs, MD [facility] [phone]

## 2019-10-17 ENCOUNTER — Other Ambulatory Visit: Payer: Self-pay

## 2019-10-17 ENCOUNTER — Ambulatory Visit (HOSPITAL_COMMUNITY)
Admission: RE | Admit: 2019-10-17 | Discharge: 2019-10-17 | Disposition: A | Discharge status: Home / Self Care | Payer: No Typology Code available for payment source | Attending: Otolaryngology | Admitting: Otolaryngology

## 2019-10-17 ENCOUNTER — Encounter (HOSPITAL_COMMUNITY)
Admission: RE | Disposition: A | Discharge status: Home / Self Care | Payer: Self-pay | Source: Home / Self Care | Attending: Otolaryngology

## 2019-10-17 ENCOUNTER — Ambulatory Visit (HOSPITAL_COMMUNITY): Payer: No Typology Code available for payment source | Admitting: Vascular Surgery

## 2019-10-17 ENCOUNTER — Encounter (HOSPITAL_COMMUNITY): Payer: Self-pay | Admitting: Otolaryngology

## 2019-10-17 ENCOUNTER — Ambulatory Visit (HOSPITAL_COMMUNITY): Payer: No Typology Code available for payment source | Admitting: Certified Registered Nurse Anesthetist

## 2019-10-17 DIAGNOSIS — Z79899 Other long term (current) drug therapy: Secondary | ICD-10-CM | POA: Insufficient documentation

## 2019-10-17 DIAGNOSIS — Z85828 Personal history of other malignant neoplasm of skin: Secondary | ICD-10-CM | POA: Insufficient documentation

## 2019-10-17 DIAGNOSIS — M1990 Unspecified osteoarthritis, unspecified site: Secondary | ICD-10-CM | POA: Insufficient documentation

## 2019-10-17 DIAGNOSIS — Z8249 Family history of ischemic heart disease and other diseases of the circulatory system: Secondary | ICD-10-CM | POA: Insufficient documentation

## 2019-10-17 DIAGNOSIS — G4733 Obstructive sleep apnea (adult) (pediatric): Secondary | ICD-10-CM | POA: Diagnosis not present

## 2019-10-17 DIAGNOSIS — Z951 Presence of aortocoronary bypass graft: Secondary | ICD-10-CM | POA: Insufficient documentation

## 2019-10-17 DIAGNOSIS — I2510 Atherosclerotic heart disease of native coronary artery without angina pectoris: Secondary | ICD-10-CM | POA: Diagnosis not present

## 2019-10-17 DIAGNOSIS — J449 Chronic obstructive pulmonary disease, unspecified: Secondary | ICD-10-CM | POA: Diagnosis not present

## 2019-10-17 DIAGNOSIS — K219 Gastro-esophageal reflux disease without esophagitis: Secondary | ICD-10-CM | POA: Diagnosis not present

## 2019-10-17 DIAGNOSIS — I10 Essential (primary) hypertension: Secondary | ICD-10-CM | POA: Diagnosis not present

## 2019-10-17 DIAGNOSIS — Z20822 Contact with and (suspected) exposure to covid-19: Secondary | ICD-10-CM | POA: Diagnosis not present

## 2019-10-17 DIAGNOSIS — F419 Anxiety disorder, unspecified: Secondary | ICD-10-CM | POA: Diagnosis not present

## 2019-10-17 DIAGNOSIS — J343 Hypertrophy of nasal turbinates: Secondary | ICD-10-CM | POA: Diagnosis not present

## 2019-10-17 DIAGNOSIS — J342 Deviated nasal septum: Secondary | ICD-10-CM

## 2019-10-17 HISTORY — PX: NASAL SEPTOPLASTY W/ TURBINOPLASTY: SHX2070

## 2019-10-17 SURGERY — SEPTOPLASTY, NOSE, WITH NASAL TURBINATE REDUCTION
Anesthesia: General | Site: Nose | Laterality: Bilateral

## 2019-10-17 MED ORDER — CEPHALEXIN 500 MG PO CAPS: 500.0000 mg | ORAL_CAPSULE | Freq: Three times a day (TID) | ORAL | 0 refills | Status: AC

## 2019-10-17 MED ORDER — DEXAMETHASONE SODIUM PHOSPHATE 10 MG/ML IJ SOLN
INTRAMUSCULAR | Status: AC
  Filled 2019-10-17: qty 1

## 2019-10-17 MED ORDER — PHENYLEPHRINE 40 MCG/ML (10ML) SYRINGE FOR IV PUSH (FOR BLOOD PRESSURE SUPPORT)
PREFILLED_SYRINGE | INTRAVENOUS | Status: DC | PRN
  Administered 2019-10-17: 120 ug via INTRAVENOUS
  Administered 2019-10-17 (×2): 80 ug via INTRAVENOUS
  Administered 2019-10-17: 120 ug via INTRAVENOUS
  Administered 2019-10-17: 80 ug via INTRAVENOUS

## 2019-10-17 MED ORDER — ACETAMINOPHEN 500 MG PO TABS
ORAL_TABLET | ORAL | Status: AC
  Filled 2019-10-17: qty 2

## 2019-10-17 MED ORDER — EPHEDRINE SULFATE-NACL 50-0.9 MG/10ML-% IV SOSY
PREFILLED_SYRINGE | INTRAVENOUS | Status: DC | PRN
  Administered 2019-10-17: 10 mg via INTRAVENOUS

## 2019-10-17 MED ORDER — CHLORHEXIDINE GLUCONATE CLOTH 2 % EX PADS: 6.0000 | MEDICATED_PAD | Freq: Once | CUTANEOUS | Status: DC

## 2019-10-17 MED ORDER — DEXAMETHASONE SODIUM PHOSPHATE 10 MG/ML IJ SOLN
INTRAMUSCULAR | Status: DC | PRN
  Administered 2019-10-17: 10 mg via INTRAVENOUS

## 2019-10-17 MED ORDER — MUPIROCIN CALCIUM 2 % EX CREA
TOPICAL_CREAM | CUTANEOUS | Status: AC
  Filled 2019-10-17: qty 15

## 2019-10-17 MED ORDER — LIDOCAINE-EPINEPHRINE 1 %-1:100000 IJ SOLN
INTRAMUSCULAR | Status: AC
  Filled 2019-10-17: qty 2

## 2019-10-17 MED ORDER — 0.9 % SODIUM CHLORIDE (POUR BTL) OPTIME
TOPICAL | Status: DC | PRN
  Administered 2019-10-17: 1000 mL

## 2019-10-17 MED ORDER — SUGAMMADEX SODIUM 200 MG/2ML IV SOLN
INTRAVENOUS | Status: DC | PRN
  Administered 2019-10-17: 200 mg via INTRAVENOUS

## 2019-10-17 MED ORDER — LIDOCAINE 2% (20 MG/ML) 5 ML SYRINGE
INTRAMUSCULAR | Status: AC
  Filled 2019-10-17: qty 5

## 2019-10-17 MED ORDER — MIDAZOLAM HCL 2 MG/2ML IJ SOLN
INTRAMUSCULAR | Status: AC
  Filled 2019-10-17: qty 2

## 2019-10-17 MED ORDER — BUPIVACAINE-EPINEPHRINE 0.5% -1:200000 IJ SOLN
INTRAMUSCULAR | Status: AC
  Filled 2019-10-17: qty 1

## 2019-10-17 MED ORDER — GLYCOPYRROLATE PF 0.2 MG/ML IJ SOSY
PREFILLED_SYRINGE | INTRAMUSCULAR | Status: DC | PRN
Start: 2019-10-17 — End: 2019-10-17
  Administered 2019-10-17: .2 mg via INTRAVENOUS

## 2019-10-17 MED ORDER — LIDOCAINE 2% (20 MG/ML) 5 ML SYRINGE
INTRAMUSCULAR | Status: DC | PRN
  Administered 2019-10-17: 100 mg via INTRAVENOUS

## 2019-10-17 MED ORDER — MIDAZOLAM HCL 5 MG/5ML IJ SOLN
INTRAMUSCULAR | Status: DC | PRN
  Administered 2019-10-17: 1 mg via INTRAVENOUS

## 2019-10-17 MED ORDER — LIDOCAINE-EPINEPHRINE 1 %-1:100000 IJ SOLN
INTRAMUSCULAR | Status: DC | PRN
  Administered 2019-10-17: 7 mL

## 2019-10-17 MED ORDER — LACTATED RINGERS IV SOLN: INTRAVENOUS | Status: DC

## 2019-10-17 MED ORDER — ONDANSETRON HCL 4 MG/2ML IJ SOLN
INTRAMUSCULAR | Status: AC
  Filled 2019-10-17: qty 2

## 2019-10-17 MED ORDER — ROCURONIUM BROMIDE 10 MG/ML (PF) SYRINGE
PREFILLED_SYRINGE | INTRAVENOUS | Status: AC
  Filled 2019-10-17: qty 10

## 2019-10-17 MED ORDER — ONDANSETRON HCL 4 MG/2ML IJ SOLN
INTRAMUSCULAR | Status: DC | PRN
  Administered 2019-10-17: 4 mg via INTRAVENOUS

## 2019-10-17 MED ORDER — FENTANYL CITRATE (PF) 250 MCG/5ML IJ SOLN
INTRAMUSCULAR | Status: DC | PRN
  Administered 2019-10-17 (×2): 50 ug via INTRAVENOUS

## 2019-10-17 MED ORDER — FENTANYL CITRATE (PF) 250 MCG/5ML IJ SOLN
INTRAMUSCULAR | Status: AC
  Filled 2019-10-17: qty 5

## 2019-10-17 MED ORDER — MUPIROCIN 2 % EX OINT
TOPICAL_OINTMENT | CUTANEOUS | Status: DC | PRN
  Administered 2019-10-17: 1 via NASAL

## 2019-10-17 MED ORDER — CHLORHEXIDINE GLUCONATE 0.12 % MT SOLN
15.0000 mL | Freq: Once | OROMUCOSAL | Status: AC
  Administered 2019-10-17: 15 mL via OROMUCOSAL
  Filled 2019-10-17: qty 15

## 2019-10-17 MED ORDER — MEPERIDINE HCL 25 MG/ML IJ SOLN: 6.2500 mg | INTRAMUSCULAR | Status: DC | PRN

## 2019-10-17 MED ORDER — PROPOFOL 10 MG/ML IV BOLUS
INTRAVENOUS | Status: AC
  Filled 2019-10-17: qty 20

## 2019-10-17 MED ORDER — OXYMETAZOLINE HCL 0.05 % NA SOLN
NASAL | Status: AC
  Filled 2019-10-17: qty 60

## 2019-10-17 MED ORDER — ONDANSETRON HCL 4 MG/2ML IJ SOLN: 4.0000 mg | Freq: Once | INTRAMUSCULAR | Status: DC | PRN

## 2019-10-17 MED ORDER — HYDROMORPHONE HCL 1 MG/ML IJ SOLN: 0.2500 mg | INTRAMUSCULAR | Status: DC | PRN

## 2019-10-17 MED ORDER — OXYMETAZOLINE HCL 0.05 % NA SOLN
NASAL | Status: DC | PRN
  Administered 2019-10-17: 1 via TOPICAL

## 2019-10-17 MED ORDER — MUPIROCIN 2 % EX OINT
TOPICAL_OINTMENT | CUTANEOUS | Status: AC
  Filled 2019-10-17: qty 22

## 2019-10-17 MED ORDER — PROPOFOL 10 MG/ML IV BOLUS
INTRAVENOUS | Status: DC | PRN
  Administered 2019-10-17: 150 mg via INTRAVENOUS
  Administered 2019-10-17: 50 mg via INTRAVENOUS

## 2019-10-17 MED ORDER — CEFAZOLIN SODIUM-DEXTROSE 2-3 GM-%(50ML) IV SOLR
INTRAVENOUS | Status: DC | PRN
Start: 2019-10-17 — End: 2019-10-17
  Administered 2019-10-17: 2 g via INTRAVENOUS

## 2019-10-17 MED ORDER — CEFAZOLIN SODIUM-DEXTROSE 2-4 GM/100ML-% IV SOLN
INTRAVENOUS | Status: AC
  Filled 2019-10-17: qty 100

## 2019-10-17 MED ORDER — BUPIVACAINE HCL (PF) 0.25 % IJ SOLN
INTRAMUSCULAR | Status: AC
  Filled 2019-10-17: qty 30

## 2019-10-17 MED ORDER — ACETAMINOPHEN 325 MG PO TABS
ORAL_TABLET | ORAL | Status: DC | PRN
Start: 2019-10-17 — End: 2019-10-17
  Administered 2019-10-17: 1000 mg via ORAL

## 2019-10-17 MED ORDER — EPINEPHRINE PF 1 MG/ML IJ SOLN
INTRAMUSCULAR | Status: AC
  Filled 2019-10-17: qty 1

## 2019-10-17 MED ORDER — BACITRACIN ZINC 500 UNIT/GM EX OINT
TOPICAL_OINTMENT | CUTANEOUS | Status: AC
  Filled 2019-10-17: qty 28.35

## 2019-10-17 MED ORDER — ROCURONIUM BROMIDE 10 MG/ML (PF) SYRINGE
PREFILLED_SYRINGE | INTRAVENOUS | Status: DC | PRN
  Administered 2019-10-17: 75 mg via INTRAVENOUS

## 2019-10-17 MED ORDER — ORAL CARE MOUTH RINSE: 15.0000 mL | Freq: Once | OROMUCOSAL | Status: AC

## 2019-10-17 SURGICAL SUPPLY — 27 items
CANISTER SUCT 3000ML PPV (MISCELLANEOUS) ×3 IMPLANT
COAGULATOR SUCT 6 FR SWTCH (ELECTROSURGICAL)
COAGULATOR SUCT SWTCH 10FR 6 (ELECTROSURGICAL) IMPLANT
COVER WAND RF STERILE (DRAPES) ×3 IMPLANT
DRAPE HALF SHEET 40X57 (DRAPES) IMPLANT
ELECT REM PT RETURN 9FT ADLT (ELECTROSURGICAL)
ELECTRODE REM PT RTRN 9FT ADLT (ELECTROSURGICAL) IMPLANT
GAUZE SPONGE 2X2 8PLY STRL LF (GAUZE/BANDAGES/DRESSINGS) ×1 IMPLANT
GLOVE BIOGEL M 7.0 STRL (GLOVE) ×6 IMPLANT
GOWN STRL REUS W/ TWL LRG LVL3 (GOWN DISPOSABLE) ×2 IMPLANT
GOWN STRL REUS W/TWL LRG LVL3 (GOWN DISPOSABLE) ×4
KIT BASIN OR (CUSTOM PROCEDURE TRAY) ×3 IMPLANT
KIT TURNOVER KIT B (KITS) ×3 IMPLANT
NEEDLE HYPO 25GX1X1/2 BEV (NEEDLE) ×3 IMPLANT
NS IRRIG 1000ML POUR BTL (IV SOLUTION) ×3 IMPLANT
PAD ARMBOARD 7.5X6 YLW CONV (MISCELLANEOUS) ×3 IMPLANT
SPLINT NASAL DOYLE BI-VL (GAUZE/BANDAGES/DRESSINGS) ×3 IMPLANT
SPONGE GAUZE 2X2 STER 10/PKG (GAUZE/BANDAGES/DRESSINGS) ×2
SPONGE NEURO XRAY DETECT 1X3 (DISPOSABLE) ×3 IMPLANT
SUT ETHILON 3 0 PS 1 (SUTURE) ×3 IMPLANT
SUT PLAIN 4 0 ~~LOC~~ 1 (SUTURE) ×3 IMPLANT
TOWEL GREEN STERILE FF (TOWEL DISPOSABLE) ×3 IMPLANT
TRAY ENT MC OR (CUSTOM PROCEDURE TRAY) ×3 IMPLANT
TUBE CONNECTING 20'X1/4 (TUBING) ×1
TUBE CONNECTING 20X1/4 (TUBING) ×2 IMPLANT
TUBE SALEM SUMP 16 FR W/ARV (TUBING) ×3 IMPLANT
TUBING EXTENTION W/L.L. (IV SETS) ×3 IMPLANT

## 2019-10-17 NOTE — H&P (Signed)
Elijah Knapp is an 64 y.o. male.   Chief Complaint: Deviated Septum HPI: Hx of nasal obstruction, difficulty with OSA  Past Medical History:  Diagnosis Date  . Arthritis   . Asthma   . Cancer ([place])    basal cell skin ca  . Chronic lung disease   . Complication of anesthesia    states he "stopped breathing" during knee surgery.   [name] COPD (chronic obstructive pulmonary disease) ([place])   . Coronary artery disease   . GERD (gastroesophageal reflux disease)   . Hypertension   . Leg cramps   . PTSD (post-traumatic stress disorder)   . Sleep apnea    uses cpap    Past Surgical History:  Procedure Laterality Date  . CORONARY ARTERY BYPASS GRAFT N/A 06/04/2017   Procedure: CORONARY ARTERY BYPASS GRAFTING (CABG) x 3 WITH ENDOSCOPIC HARVESTING OF RIGHT SAPHENOUS VEIN;  Surgeon: Ivin Poot, MD;  Location: [place];  Service: Open Heart Surgery;  Laterality: N/A;  . KNEE ARTHROSCOPY W/ MENISCAL REPAIR Right   . KNEE SURGERY Left   . LEFT HEART CATH AND CORONARY ANGIOGRAPHY N/A 05/28/2017   Procedure: LEFT HEART CATH AND CORONARY ANGIOGRAPHY;  Surgeon: Lorretta Harp, MD;  Location: [place] [facility];  Service: Cardiovascular;  Laterality: N/A;  . TEE WITHOUT CARDIOVERSION N/A 06/04/2017   Procedure: TRANSESOPHAGEAL ECHOCARDIOGRAM (TEE);  Surgeon: Prescott Gum, Collier Salina, MD;  Location: [place];  Service: Open Heart Surgery;  Laterality: N/A;    Family History  Problem Relation Age of Onset  . Alzheimer's disease Mother   . Heart disease Father    Social History:  reports that he has never smoked. He has never used smokeless tobacco. He reports current alcohol use of about 21.0 standard drinks of alcohol per week. He reports that he does not use drugs.  Allergies: No Known Allergies  Medications Prior to Admission  Medication Sig Dispense Refill  . amoxicillin-clavulanate (AUGMENTIN) 875-125 MG tablet Take by mouth.    [name] atorvastatin (LIPITOR) 20 MG tablet Take 20 mg by mouth daily.     . cefUROXime (CEFTIN) 250 MG tablet Take 250 mg by mouth every morning. (Patient not taking: Reported on 10/15/2019)    . cholecalciferol (VITAMIN D3) 25 MCG (1000 UNIT) tablet Take 1,000 Units by mouth daily. (Patient not taking: Reported on 10/15/2019)    . finasteride (PROSCAR) 5 MG tablet Take 2.5 mg by mouth daily. (Patient not taking: Reported on 10/15/2019)    . metoprolol tartrate (LOPRESSOR) 50 MG tablet Take 50 mg by mouth 2 (two) times daily.     [name] omeprazole (PRILOSEC) 20 MG capsule Take 1 capsule (20 mg total) by mouth daily.    [name] albuterol (VENTOLIN HFA) 108 (90 Base) MCG/ACT inhaler Inhale into the lungs. (Patient not taking: Reported on 10/15/2019)    . ALPRAZolam (XANAX) 0.5 MG tablet Take by mouth. (Patient not taking: Reported on 10/15/2019)    . fluocinonide ointment (LIDEX) 0.05 % Apply topically. (Patient not taking: Reported on 10/15/2019)    . fluticasone (FLONASE) 50 MCG/ACT nasal spray 1 spray by Each Nare route. (Patient not taking: Reported on 10/15/2019)    . hydrocortisone 2.5 % ointment Apply topically. (Patient not taking: Reported on 10/15/2019)    . losartan (COZAAR) 100 MG tablet  (Patient not taking: Reported on 10/15/2019)    . sildenafil (VIAGRA) 100 MG tablet Take by mouth. (Patient not taking: Reported on 10/15/2019)      Results for orders placed or  performed during the hospital encounter of 10/15/19 (from the past 48 hour(s))  SARS CORONAVIRUS 2 (TAT 6-24 HRS) Nasopharyngeal Nasopharyngeal Swab     Status: None   Collection Time: 10/15/19  9:56 AM   Specimen: Nasopharyngeal Swab  Result Value Ref Range   SARS Coronavirus 2 NEGATIVE NEGATIVE    Comment: (NOTE) SARS-CoV-2 target nucleic acids are NOT DETECTED.  The SARS-CoV-2 RNA is generally detectable in upper and lower respiratory specimens during the acute phase of infection. Negative results do not preclude SARS-CoV-2 infection, do not rule out co-infections with other pathogens, and should not be used  as the sole basis for treatment or other patient management decisions. Negative results must be combined with clinical observations, patient history, and epidemiological information. The expected result is Negative.  Fact Sheet for Patients: SugarRoll.be  Fact Sheet for Healthcare Providers: https://www.woods-mathews.com/  This test is not yet approved or cleared by the [place] FDA and  has been authorized for detection and/or diagnosis of SARS-CoV-2 by FDA under an Emergency Use Authorization (EUA). This EUA will remain  in effect (meaning this test can be used) for the duration of the COVID-19 declaration under Se ction 564(b)(1) of the Act, 21 U.S.C. section 360bbb-3(b)(1), unless the authorization is terminated or revoked sooner.  Performed at [facility], [place] [address], [place], [place] [address]    DG Chest 2 View  Result Date: 10/15/2019 CLINICAL DATA:  Status post CABG. EXAM: CHEST - 2 VIEW COMPARISON:  Jul 11, 2017 FINDINGS: The patient is status post prior CABG. The heart size is stable. Aortic calcifications are noted. There is no pneumothorax. No large pleural effusion. No focal infiltrate. No acute displaced fracture. IMPRESSION: No active cardiopulmonary disease. Electronically Signed   By: Constance Holster M.D.   On: 10/15/2019 16:31    Review of Systems  Constitutional: Negative.   HENT: Positive for congestion.   Respiratory: Negative.   Cardiovascular: Negative.     Blood pressure (!) 149/82, pulse 60, temperature 97.8 F (36.6 C), temperature source Temporal, resp. rate 18, height 5\' 7"  (1.702 m), weight 74.8 kg, SpO2 100 %. Physical Exam HENT:     Nose: Congestion present.  Cardiovascular:     Rate and Rhythm: Normal rate.  Pulmonary:     Effort: Pulmonary effort is normal.  Musculoskeletal:     Cervical back: Normal range of motion.      Assessment/Plan Adm for OP septoplasty and IT  reduction  [name], MD 10/17/2019, 7:39 AM

## 2019-10-17 NOTE — Op Note (Signed)
Operative Note: SEPTOPLASTY AND INFERIOR TURBINATE REDUCTION  Patient: Elijah Knapp  Medical record number: [id number]  Date:10/17/2019  Pre-operative Indications: 1. Deviated nasal septum with nasal airway obstruction     2.  Bilateral inferior turbinate hypertrophy  Postoperative Indications: Same  Surgical Procedure: 1.  Nasal Septoplasty    2.  Bilateral Inferior Turbinate Reduction  Anesthesia: GET  Surgeon: Delsa Bern, M.D.  Complications: None  EBL: 50 cc  Findings: Severely deviated nasal septum with airway obstruction and bilateral inferior turbinate hypertrophy.   Brief History: The patient is a 64 y.o. male with a history of progressive nasal airway obstruction. The patient has been on medical therapy to reduce nasal mucosal edema including saline nasal spray and topical nasal steroids. Despite appropriate medical therapy the patient continues to have ongoing symptoms. Given the patient's history and findings, the above surgical procedures were recommended, risks and benefits were discussed in detail with the patient may understand and agree with our plan for surgery which is scheduled at [facility] under general anesthesia as an outpatient.  Surgical Procedure: The patient is brought to the operating room on 10/17/2019 and placed in supine position on the operating table. General endotracheal anesthesia was established without difficulty. When the patient was adequately anesthetized, surgical timeout was performed and correct identification of the patient and the surgical procedure. The patient's nose was then injected with  7 cc of 1% lidocaine 1 100,000 dilution epinephrine which was injected in a submucosal fashion. The patient's nose was then packed with Afrin-soaked cottonoid pledgets were left in place for approximately 10 minutes lateral vasoconstriction and hemostasis. .  With the patient prepped draped and prepared for surgery, nasal septoplasty was  begun.  A left anterior hemitransfixion incision was created and a mucoperichondrial flap was elevated from anterior to posterior on the left-hand side. The anterior cartilaginous septum was crossed at the midline and a mucoperichondrial flap was elevated on the patient's right.  Swivel knife was then used to resect the anterior and mid cartilaginous portion of the nasal septum.  Resected cartilage was morcellized and returned to the mucoperichondrial pocket at the occlusion of the surgical procedure.  Dissection was then carried out from anterior to posterior removing deviated bone and cartilage including a large septal spur the overlying mucosa was preserved.  With the septum brought to good midline position, the morselized cartilage was returned to the mucoperichondrial pocket and the soft tissue/mucosal flaps were reapproximated with interrupted 4-0 gut suture on a Keith needle in a horizontal mattressing fashion.  Anterior hemitransfixion incision was closed with the same stitch.  Bilateral Doyle nasal septal splints were then placed after the application of Bactroban ointment and sutured in position with a 3-0 Ethilon suture.  Attention was then turned to the inferior turbinates, bilateral inferior turbinate intramural cautery was performed with cautery setting at 33 W.  2 submucosal passes were made in each inferior turbinate.  After completing cautery, anterior vertical incisions were created and overlying soft tissue was elevated, a small amount of turbinate bone was resected.  The turbinates were then outfractured to create a more patent nasal passageway.  Surgical sponge count was correct. An oral gastric tube was passed and the stomach contents were aspirated. Patient was awakened from anesthetic and transferred from the operating room to the recovery room in stable condition. There were no complications and blood loss was 50cc.   Delsa Bern, M.D. [facility] 10/17/2019

## 2019-10-17 NOTE — Anesthesia Procedure Notes (Signed)
Procedure Name: Intubation Performed by: Milford Cage, CRNA Pre-anesthesia Checklist: Patient identified, Emergency Drugs available, Suction available and Patient being monitored Patient Re-evaluated:Patient Re-evaluated prior to induction Oxygen Delivery Method: Circle System Utilized Preoxygenation: Pre-oxygenation with 100% oxygen Induction Type: IV induction Ventilation: Mask ventilation without difficulty Laryngoscope Size: Mac and 3 Grade View: Grade I Tube type: Oral Tube size: 7.0 mm Number of attempts: 1 Airway Equipment and Method: Stylet and Oral airway Placement Confirmation: ETT inserted through vocal cords under direct vision,  positive ETCO2 and breath sounds checked- equal and bilateral Secured at: 22 cm Tube secured with: Tape Dental Injury: Teeth and Oropharynx as per pre-operative assessment

## 2019-10-17 NOTE — Transfer of Care (Signed)
Immediate Anesthesia Transfer of Care Note  Patient: Elijah Knapp  Procedure(s) Performed: NASAL SEPTOPLASTY WITH TURBINATE REDUCTION (Bilateral Nose)  Patient Location: PACU  Anesthesia Type:General  Level of Consciousness: awake  Airway & Oxygen Therapy: Patient Spontanous Breathing  Post-op Assessment: Report given to RN and Post -op Vital signs reviewed and stable  Post vital signs: Reviewed and stable  Last Vitals:  Vitals Value Taken Time  BP 127/66 10/17/19 0841  Temp    Pulse 67 10/17/19 0841  Resp 12 10/17/19 0841  SpO2 96 % 10/17/19 0841  Vitals shown include unvalidated device data.  Last Pain:  Vitals:   10/17/19 0638  TempSrc:   PainSc: 0-No pain      Patients Stated Pain Goal: 6 (05/24/20 4825)  Complications: No complications documented.

## 2019-10-20 ENCOUNTER — Encounter (HOSPITAL_COMMUNITY): Payer: Self-pay | Admitting: Otolaryngology

## 2019-10-29 NOTE — Anesthesia Postprocedure Evaluation (Signed)
Anesthesia Post Note  Patient: Elijah Knapp  Procedure(s) Performed: NASAL SEPTOPLASTY WITH TURBINATE REDUCTION (Bilateral Nose)     Patient location during evaluation: PACU Anesthesia Type: General Level of consciousness: awake and alert Pain management: pain level controlled Vital Signs Assessment: post-procedure vital signs reviewed and stable Respiratory status: spontaneous breathing, nonlabored ventilation, respiratory function stable and patient connected to nasal cannula oxygen Cardiovascular status: blood pressure returned to baseline and stable Postop Assessment: no apparent nausea or vomiting Anesthetic complications: no   No complications documented.  Last Vitals:  Vitals:   10/17/19 0906 10/17/19 0907  BP: 126/71 126/71  Pulse: 74 69  Resp: 16 13  Temp: 36.4 C 37 C  SpO2: 95% 97%    Last Pain:  Vitals:   10/17/19 0907  TempSrc:   PainSc: 0-No pain                 Cherryl Babin DAVID

## 2019-11-12 ENCOUNTER — Ambulatory Visit (INDEPENDENT_AMBULATORY_CARE_PROVIDER_SITE_OTHER): Payer: No Typology Code available for payment source | Admitting: Cardiothoracic Surgery

## 2019-11-12 ENCOUNTER — Other Ambulatory Visit: Payer: Self-pay

## 2019-11-12 ENCOUNTER — Encounter: Payer: Self-pay | Admitting: Cardiothoracic Surgery

## 2019-11-12 ENCOUNTER — Encounter: Payer: Self-pay | Admitting: *Deleted

## 2019-11-12 ENCOUNTER — Other Ambulatory Visit: Payer: Self-pay | Admitting: *Deleted

## 2019-11-12 VITALS — BP 106/56 | HR 80 | Temp 98.1°F | Resp 20 | Ht 67.0 in | Wt 168.0 lb

## 2019-11-12 DIAGNOSIS — T8189XA Other complications of procedures, not elsewhere classified, initial encounter: Secondary | ICD-10-CM

## 2019-11-12 DIAGNOSIS — Z09 Encounter for follow-up examination after completed treatment for conditions other than malignant neoplasm: Secondary | ICD-10-CM | POA: Diagnosis not present

## 2019-11-12 DIAGNOSIS — Z951 Presence of aortocoronary bypass graft: Secondary | ICD-10-CM

## 2019-11-12 MED ORDER — ALPRAZOLAM 0.5 MG PO TABS
0.5000 mg | ORAL_TABLET | Freq: Every evening | ORAL | 0 refills | Status: AC | PRN
Start: 2019-11-12 — End: ?

## 2019-11-12 NOTE — Progress Notes (Signed)
PCP is [facility] Referring Provider is [facility]  Chief Complaint  Patient presents with  . 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 The upper sternal wires are protruding and are uncomfortable.  He wishes to have them removed.  We will set him up as an outpatient to perform this under MAC anesthesia.  Sternal wound is completely healed there is no evidence of cellulitis.  It is a matter of discomfort from the 2 top sternal wires protruding out of the skin.  The patient recently had nasal septoplasty by Dr. Wilburn Cornelia and is completely recovered. Past Medical History:  Diagnosis Date  . Arthritis   . Asthma   . Cancer ([place])    basal cell skin ca  . Chronic lung disease   . Complication of anesthesia    states he "stopped breathing" during knee surgery.   [name] COPD (chronic obstructive pulmonary disease) ([place])   . Coronary artery disease   . GERD (gastroesophageal reflux disease)   . Hypertension   . Leg cramps   . PTSD (post-traumatic stress disorder)   . Sleep apnea    uses cpap    Past Surgical History:  Procedure Laterality Date  . CORONARY ARTERY BYPASS GRAFT N/A 06/04/2017   Procedure: CORONARY ARTERY BYPASS GRAFTING (CABG) x 3 WITH ENDOSCOPIC HARVESTING OF RIGHT SAPHENOUS VEIN;  Surgeon: Ivin Poot, MD;  Location: [facility];  Service: Open Heart Surgery;  Laterality: N/A;  . KNEE ARTHROSCOPY W/ MENISCAL REPAIR Right   . KNEE SURGERY Left   . LEFT HEART CATH AND CORONARY ANGIOGRAPHY N/A 05/28/2017   Procedure: LEFT HEART CATH AND CORONARY ANGIOGRAPHY;  Surgeon: Lorretta Harp, MD;  Location: [facility];  Service: Cardiovascular;  Laterality: N/A;  . NASAL SEPTOPLASTY W/ TURBINOPLASTY Bilateral 10/17/2019   Procedure: NASAL SEPTOPLASTY WITH TURBINATE REDUCTION;  Surgeon: [name], MD;  Location: [facility];  Service: ENT;  Laterality: Bilateral;  NASAL  SEPTOPLASTY WITH TURBINATE REDUCTION  . TEE WITHOUT CARDIOVERSION N/A 06/04/2017   Procedure: TRANSESOPHAGEAL ECHOCARDIOGRAM (TEE);  Surgeon: Prescott Gum, Collier Salina, MD;  Location: [facility];  Service: Open Heart Surgery;  Laterality: N/A;    Family History  Problem Relation Age of Onset  . Alzheimer's disease Mother   . Heart disease Father     Social History Social History   Tobacco Use  . Smoking status: Never Smoker  . Smokeless tobacco: Never Used  Vaping Use  . Vaping Use: Never used  Substance Use Topics  . Alcohol use: Yes    Alcohol/week: 21.0 standard drinks    Types: 7 Glasses of wine, 14 Cans of beer per week  . Drug use: Never    Current Outpatient Medications  Medication Sig Dispense Refill  . albuterol (VENTOLIN HFA) 108 (90 Base) MCG/ACT inhaler Inhale into the lungs.     [name] amoxicillin-clavulanate (AUGMENTIN) 875-125 MG tablet Take by mouth.    [name] atorvastatin (LIPITOR) 20 MG tablet Take 20 mg by mouth daily.    . cefUROXime (CEFTIN) 250 MG tablet Take 250 mg by mouth every morning.     . cholecalciferol (VITAMIN D3) 25 MCG (1000 UNIT) tablet Take 1,000 Units by mouth daily.     . finasteride (PROSCAR) 5 MG tablet Take 2.5 mg by mouth daily.     . fluocinonide ointment (LIDEX) 0.05 % Apply topically.     . fluticasone (FLONASE) 50  MCG/ACT nasal spray 1 spray by Each Nare route.    . hydrocortisone 2.5 % ointment Apply topically.     [name] losartan (COZAAR) 100 MG tablet     . metoprolol tartrate (LOPRESSOR) 50 MG tablet Take 50 mg by mouth 2 (two) times daily.     [name] omeprazole (PRILOSEC) 20 MG capsule Take 1 capsule (20 mg total) by mouth daily.    . sildenafil (VIAGRA) 100 MG tablet Take by mouth.      No current facility-administered medications for this visit.    No Known Allergies  Review of Systems  No angina No shortness of breath Patient doubly vaccinated against COVID-19 No orthopnea No fever No cough chest pain or loss of taste or smell  BP (!) 106/56  (BP Location: Left Arm, Patient Position: Sitting, Cuff Size: Large)   Pulse 80   Temp 98.1 F (36.7 C)   Resp 20   Ht _0  (1.702 m)   Wt 168 lb (76.2 kg)   SpO2 96% Comment: RA  BMI 26.31 kg/m  Physical Exam      Exam    General- alert and comfortable    Neck- no JVD, no cervical adenopathy palpable, no carotid bruit   Lungs- clear without rales, wheezes   Cor- regular rate and rhythm, no murmur , gallop   Abdomen- soft, non-tender   Extremities - warm, non-tender, minimal edema   Neuro- oriented, appropriate, no focal weakness   Diagnostic Tests: None  Impression: Significant discomfort from protruding the upper 2 sternal wires now over 5 months postop.  The wires can be removed as the sternum is now completely bonded.  Plan: Patient be scheduled for sternal wire removal as an outpatient on September 16 at [facility].   Len Childs, MD [facility] [phone]

## 2019-11-17 NOTE — Pre-Procedure Instructions (Signed)
[id number] [address], [place] [address] Phone: [phone] Fax: [phone]      Your procedure is scheduled on Thursday September 16th.  Report to [facility] Main Entrance "A" at 5:30 A.M., and check in at the Admitting office.  Call this number if you have problems the morning of surgery:  [phone]  Call [phone] if you have any questions prior to your surgery date Monday-Friday 8am-4pm    Remember:  Do not eat after midnight the night before your surgery  You may drink clear liquids until 4:30 the morning of your surgery.   Clear liquids allowed are: Water, Non-Citrus Juices (without pulp), Carbonated Beverages, Clear Tea, Black Coffee Only, and Gatorade    Take these medicines the morning of surgery with A SIP OF WATER  budesonide-formoterol (SYMBICORT) finasteride (PROSCAR)  metoprolol tartrate (LOPRESSOR)  omeprazole (PRILOSEC) fluticasone (FLONASE)-if needed  Follow your surgeon's instructions on when to stop Asprin.  If no instructions were given by your surgeon then you will need to call the office to get those instructions.     As of today, STOP taking any Aspirin (unless otherwise instructed by your surgeon) Aleve, Naproxen, Ibuprofen, Motrin, Advil, Goody's, BC's, all herbal medications, fish oil, and all vitamins.                      Do not wear jewelry, make up, or nail polish            Do not wear lotions, powders, perfumes/colognes, or deodorant.            Do not shave 48 hours prior to surgery.  Men may shave face and neck.            Do not bring valuables to the hospital.            [facility] is not responsible for any belongings or valuables.  Do NOT Smoke (Tobacco/Vaping) or drink Alcohol 24 hours prior to your procedure If you use a CPAP at night, you may bring all equipment for your overnight stay.   Contacts, glasses, dentures or bridgework may not be worn into surgery.       For patients admitted to the hospital, discharge time will be determined by your treatment team.   Patients discharged the day of surgery will not be allowed to drive home, and someone needs to stay with them for 24 hours.    Special instructions:   [facility]- Preparing For Surgery  Before surgery, you can play an important role. Because skin is not sterile, your skin needs to be as free of germs as possible. You can reduce the number of germs on your skin by washing with CHG (chlorahexidine gluconate) Soap before surgery.  CHG is an antiseptic cleaner which kills germs and bonds with the skin to continue killing germs even after washing.    Oral Hygiene is also important to reduce your risk of infection.  Remember - BRUSH YOUR TEETH THE MORNING OF SURGERY WITH YOUR REGULAR TOOTHPASTE  Please do not use if you have an allergy to CHG or antibacterial soaps. If your skin becomes reddened/irritated stop using the CHG.  Do not shave (including legs and underarms) for at least 48 hours prior to first CHG shower. It is OK to shave your face.  Please follow these instructions carefully.   1. Shower the NIGHT BEFORE SURGERY and the MORNING OF SURGERY with CHG  Soap.   2. If you chose to wash your hair, wash your hair first as usual with your normal shampoo.  3. After you shampoo, rinse your hair and body thoroughly to remove the shampoo.  4. Use CHG as you would any other liquid soap. You can apply CHG directly to the skin and wash gently with a scrungie or a clean washcloth.   5. Apply the CHG Soap to your body ONLY FROM THE NECK DOWN.  Do not use on open wounds or open sores. Avoid contact with your eyes, ears, mouth and genitals (private parts). Wash Face and genitals (private parts)  with your normal soap.   6. Wash thoroughly, paying special attention to the area where your surgery will be performed.  7. Thoroughly rinse your body with warm water from the neck down.  8. DO NOT  shower/wash with your normal soap after using and rinsing off the CHG Soap.  9. Pat yourself dry with a CLEAN TOWEL.  10. Wear CLEAN PAJAMAS to bed the night before surgery  11. Place CLEAN SHEETS on your bed the night of your first shower and DO NOT SLEEP WITH PETS.   Day of Surgery: Wear Clean/Comfortable clothing the morning of surgery Do not apply any deodorants/lotions.   Remember to brush your teeth WITH YOUR REGULAR TOOTHPASTE.   Please read over the following fact sheets that you were given.

## 2019-11-18 ENCOUNTER — Encounter (HOSPITAL_COMMUNITY): Payer: Self-pay

## 2019-11-18 ENCOUNTER — Encounter (HOSPITAL_COMMUNITY)
Admission: RE | Admit: 2019-11-18 | Discharge: 2019-11-18 | Disposition: A | Discharge status: Home / Self Care | Payer: No Typology Code available for payment source | Source: Ambulatory Visit | Attending: Cardiothoracic Surgery | Admitting: Cardiothoracic Surgery

## 2019-11-18 ENCOUNTER — Other Ambulatory Visit: Payer: Self-pay

## 2019-11-18 ENCOUNTER — Other Ambulatory Visit (HOSPITAL_COMMUNITY)
Admission: RE | Admit: 2019-11-18 | Discharge: 2019-11-18 | Disposition: A | Discharge status: Home / Self Care | Payer: No Typology Code available for payment source | Source: Ambulatory Visit | Attending: Cardiothoracic Surgery | Admitting: Cardiothoracic Surgery

## 2019-11-18 DIAGNOSIS — Z20822 Contact with and (suspected) exposure to covid-19: Secondary | ICD-10-CM | POA: Diagnosis not present

## 2019-11-18 DIAGNOSIS — Z01812 Encounter for preprocedural laboratory examination: Secondary | ICD-10-CM | POA: Insufficient documentation

## 2019-11-18 DIAGNOSIS — Z01810 Encounter for preprocedural cardiovascular examination: Secondary | ICD-10-CM | POA: Insufficient documentation

## 2019-11-18 DIAGNOSIS — T8189XA Other complications of procedures, not elsewhere classified, initial encounter: Secondary | ICD-10-CM

## 2019-11-18 LAB — COMPREHENSIVE METABOLIC PANEL
ALT: 24 U/L (ref 0–44)
AST: 22 U/L (ref 15–41)
Albumin: 4.2 g/dL (ref 3.5–5.0)
Alkaline Phosphatase: 53 U/L (ref 38–126)
Anion gap: 7 (ref 5–15)
BUN: 16 mg/dL (ref 8–23)
CO2: 26 mmol/L (ref 22–32)
Calcium: 9.2 mg/dL (ref 8.9–10.3)
Chloride: 104 mmol/L (ref 98–111)
Creatinine, Ser: 0.9 mg/dL (ref 0.61–1.24)
GFR calc Af Amer: >60 mL/min (ref >60)
GFR calc non Af Amer: >60 mL/min (ref >60)
Glucose, Bld: 113 mg/dL — ABNORMAL HIGH (ref 70–99)
Potassium: 4.2 mmol/L (ref 3.5–5.1)
Sodium: 137 mmol/L (ref 135–145)
Total Bilirubin: 0.7 mg/dL (ref 0.3–1.2)
Total Protein: 7 g/dL (ref 6.5–8.1)

## 2019-11-18 LAB — APTT: aPTT: 26 seconds (ref 24–36)

## 2019-11-18 LAB — CBC
HCT: 38.8 % — ABNORMAL LOW (ref 39.0–52.0)
Hemoglobin: 12.8 g/dL — ABNORMAL LOW (ref 13.0–17.0)
MCH: 32.2 pg (ref 26.0–34.0)
MCHC: 33 g/dL (ref 30.0–36.0)
MCV: 97.7 fL (ref 80.0–100.0)
Platelets: 244 10*3/uL (ref 150–400)
RBC: 3.97 MIL/uL — ABNORMAL LOW (ref 4.22–5.81)
RDW: 12.7 % (ref 11.5–15.5)
WBC: 3.8 10*3/uL — ABNORMAL LOW (ref 4.0–10.5)
nRBC: 0 % (ref 0.0–0.2)

## 2019-11-18 LAB — SURGICAL PCR SCREEN
MRSA, PCR: NEGATIVE
Staphylococcus aureus: NEGATIVE

## 2019-11-18 LAB — TYPE AND SCREEN
ABO/RH(D): A POS
Antibody Screen: NEGATIVE

## 2019-11-18 LAB — SARS CORONAVIRUS 2 (TAT 6-24 HRS): SARS Coronavirus 2: NEGATIVE

## 2019-11-18 LAB — PROTIME-INR
INR: 0.9 (ref 0.8–1.2)
Prothrombin Time: 12 seconds (ref 11.4–15.2)

## 2019-11-18 NOTE — Progress Notes (Signed)
Anesthesia Chart Review:  Case: [id number] Date/Time: 11/20/19 0715   Procedure: STERNAL WIRES REMOVAL (N/A )   Anesthesia type: Monitor Anesthesia Care   Pre-op diagnosis: STERNAL PAIN   Location: [facility] OR ROOM 17 / [facility] OR   Surgeons: Ivin Poot, MD      DISCUSSION: Patient is a 64 year old male scheduled for the above procedure.  He is s/p nasal septoplasty and bilateral inferior turbinate reduction on 10/17/19. S/p CABG 06/04/17. He has 2 protruding sternal wires that are causing significant discomfort, so the above procedure recommended.   History includes never smoker, HTN, CAD (s/p CABG: LIMA-LAD, SVG-DIAG, SVG-CX 06/04/17), COPD, asthma, OSA (CPAP), PTSD, GERD, skin cancer ([facility]), knee surgery (right knee arthroscopy 04/29/08). He reported a prior knee surgery at the [facility] in which he "stopped breathing." Per previous anesthesia APP documentation in 2019, it sounded like it may have been a combination of sedation and OSA, as he told his PAT RN that he was told to use his CPAP.   Last cardiology evaluation 10/08/2019 by Kerin Ransom, PA-C for preoperative evaluation for nasal septoplasty.  Patient had not been seen there since December 2019, but have been doing well from a cardiac standpoint. He was walking at least 3 times a week for exercise.  No chest pain or shortness of breath. He was felt "acceptable risk" for that procedure without further preoperative cardiac work-up.   11/18/19 preoperative Covid test negative. Anesthesia team to evaluate on the day of surgery.   VS: BP 129/75   Pulse 62   Temp 36.9 C (Oral)   Resp 17   Ht 5\' 7"  (1.702 m)   Wt 75.2 kg   SpO2 100%   BMI 25.97 kg/m    PROVIDERS: [facility] is where he receives primary care Alferd Patee, Corey Harold, MD) Quay Burow, MD is cardiologist. Reportedly, also sees a cardiologist at the [facility].   LABS: Labs reviewed: Acceptable for surgery. (all labs ordered are listed, but only abnormal  results are displayed)  Labs Reviewed  CBC - Abnormal; Notable for the following components:      Result Value   WBC 3.8 (*)    RBC 3.97 (*)    Hemoglobin 12.8 (*)    HCT 38.8 (*)    All other components within normal limits  COMPREHENSIVE METABOLIC PANEL - Abnormal; Notable for the following components:   Glucose, Bld 113 (*)    All other components within normal limits  SURGICAL PCR SCREEN  APTT  PROTIME-INR  TYPE AND SCREEN    PFTs 06/01/17:FVC 3.07 (74%), post 3.55 (85%). FEV1 1.48 (47%), post 1.70 (54%). DLCO unc 24.21 (85%), cor 24.01 (84%).   IMAGES: CXR 11/18/19:  FINDINGS: Post sternotomy changes. No focal opacity or pleural effusion. Normal heart size. No pneumothorax. Degenerative changes of the spine. IMPRESSION: No active cardiopulmonary disease.   EKG: 11/18/19: SB at 55 bpm   CV: TEE (Intra-operative TEE for CABG) 06/04/17:  Mitral valve: Trace regurgitation.   Aortic valve: The valve is trileaflet. No stenosis. No regurgitation.   Right ventricle: Normal cavity size and ejection fraction.   Left ventricle: No significant regional wall motion abnormalities. LVEF:  50-55%.  - Tricuspid, Pulmonic, Mitral and Aortic valve unchanged. No significant  regional wall motion abnormalities. CO 4.2 with pressor support. LVEF  unchanged. Aortic cannula removed, no dissection present.    Carotid U/S3/29/19: - Right Carotid: Velocities in the right ICA are consistent with a 1-39%  stenosis.  -  Left Carotid: Velocities in the left ICA are consistent with a 1-39% stenosis.  - Vertebrals: Bilateral vertebral arteries demonstrate antegrade flow.  - Subclavians: Normal flow hemodynamics were seen in bilateral subclavian arteries.    Cardiac cath (Pre-CABG) 05/28/17:  Prox LAD to Mid LAD lesion is 95% stenosed.  Ost 1st Diag to 1st Diag lesion is 80% stenosed.  Mid LAD lesion is 70% stenosed.  Ost 1st Mrg to 1st Mrg lesion is 90% stenosed.  Mid Cx  to Dist Cx lesion is 90% stenosed.  Dist RCA lesion is 40% stenosed.  The left ventricular systolic function is normal.  LV end diastolic pressure is normal.  The left ventricular ejection fraction is 55-65% by visual estimate. - s/p CABG: LIMA-LAD, SVG-DIAG, SVG-CX 06/04/17   Past Medical History:  Diagnosis Date  . Arthritis   . Asthma   . Cancer ([place])    basal cell skin ca  . Chronic lung disease   . Complication of anesthesia    states he "stopped breathing" during knee surgery.   [name] COPD (chronic obstructive pulmonary disease) ([place])   . Coronary artery disease   . GERD (gastroesophageal reflux disease)   . Hypertension   . Leg cramps   . PTSD (post-traumatic stress disorder)   . Sleep apnea    uses cpap    Past Surgical History:  Procedure Laterality Date  . CARDIAC CATHETERIZATION Left 05/28/2017  . CORONARY ARTERY BYPASS GRAFT N/A 06/04/2017   Procedure: CORONARY ARTERY BYPASS GRAFTING (CABG) x 3 WITH ENDOSCOPIC HARVESTING OF RIGHT SAPHENOUS VEIN;  Surgeon: Ivin Poot, MD;  Location: [facility];  Service: Open Heart Surgery;  Laterality: N/A;  . KNEE ARTHROSCOPY W/ MENISCAL REPAIR Right   . KNEE SURGERY Left   . LEFT HEART CATH AND CORONARY ANGIOGRAPHY N/A 05/28/2017   Procedure: LEFT HEART CATH AND CORONARY ANGIOGRAPHY;  Surgeon: Lorretta Harp, MD;  Location: [facility];  Service: Cardiovascular;  Laterality: N/A;  . NASAL SEPTOPLASTY W/ TURBINOPLASTY Bilateral 10/17/2019   Procedure: NASAL SEPTOPLASTY WITH TURBINATE REDUCTION;  Surgeon: [name], MD;  Location: [facility];  Service: ENT;  Laterality: Bilateral;  NASAL SEPTOPLASTY WITH TURBINATE REDUCTION  . TEE WITHOUT CARDIOVERSION N/A 06/04/2017   Procedure: TRANSESOPHAGEAL ECHOCARDIOGRAM (TEE);  Surgeon: Prescott Gum, Collier Salina, MD;  Location: [facility];  Service: Open Heart Surgery;  Laterality: N/A;    MEDICATIONS: . ALPRAZolam (XANAX) 0.5 MG tablet  . aspirin EC 81 MG tablet  . atorvastatin (LIPITOR) 20 MG  tablet  . budesonide-formoterol (SYMBICORT) 80-4.5 MCG/ACT inhaler  . cefUROXime (CEFTIN) 250 MG tablet  . cetirizine (ZYRTEC) 10 MG tablet  . cholecalciferol (VITAMIN D3) 25 MCG (1000 UNIT) tablet  . finasteride (PROSCAR) 5 MG tablet  . fluocinonide ointment (LIDEX) 0.05 %  . fluticasone (FLONASE) 50 MCG/ACT nasal spray  . hydrocortisone 2.5 % ointment  . losartan (COZAAR) 100 MG tablet  . metoprolol tartrate (LOPRESSOR) 50 MG tablet  . omeprazole (PRILOSEC) 20 MG capsule  . sildenafil (VIAGRA) 100 MG tablet  . sodium chloride (OCEAN) 0.65 % SOLN nasal spray   No current facility-administered medications for this encounter.    Myra Gianotti, PA-C Surgical Short Stay/Anesthesiology [facility] Phone [phone] [facility] Phone [phone] 11/19/2019 9:58 AM

## 2019-11-18 NOTE — Progress Notes (Signed)
PCP - Dr. Dani Gobble at [facility] in [place]  Chest x-ray - 11/18/19 EKG - 11/18/19 Stress Test - Denies ECHO - 06/04/17 Cardiac Cath -05/28/17   Sleep Study - Has OSA CPAP - Nightly  DM - Denies  Aspirin Instructions: Asked patient to call Dr. Nils Pyle regarding instruction for his 81mg  ASA  ERAS Protcol -Instructions given  COVID TEST- 11/18/19  Anesthesia review: Yes cardiac history  Patient denies shortness of breath, fever, cough and chest pain at PAT appointment   All instructions explained to the patient, with a verbal understanding of the material. Patient agrees to go over the instructions while at home for a better understanding. Patient also instructed to self quarantine after being tested for COVID-19. The opportunity to ask questions was provided.

## 2019-11-19 NOTE — Anesthesia Preprocedure Evaluation (Addendum)
Anesthesia Evaluation  Patient identified by MRN, date of birth, ID band Patient awake    Reviewed: Allergy & Precautions, NPO status , Patient's Chart, lab work & pertinent test results, reviewed documented beta blocker date and time   History of Anesthesia Complications Negative for: history of anesthetic complications  Airway Mallampati: II  TM Distance: >3 FB Neck ROM: Full    Dental  (+) Dental Advisory Given   Pulmonary asthma , sleep apnea and Continuous Positive Airway Pressure Ventilation , COPD,    breath sounds clear to auscultation       Cardiovascular hypertension, Pt. on medications and Pt. on home beta blockers (-) angina+ CAD and + CABG   Rhythm:Regular  Mitral valve: Trace regurgitation.   Aortic valve: The valve is trileaflet. No stenosis. No regurgitation.   Right ventricle: Normal cavity size and ejection fraction.   Left ventricle: No significant regional wall motion abnormalities. LVEF:  50-55%.    Tricuspid, Pulmonic, Mitral and Aortic valve unchanged. No significant  regional wall motion abnormalities. CO 4.2 with pressor support. LVEF  unchanged. Aortic cannula removed, no dissection present.    Neuro/Psych PSYCHIATRIC DISORDERS Anxiety negative neurological ROS     GI/Hepatic Neg liver ROS, GERD  Medicated and Controlled,  Endo/Other  negative endocrine ROS  Renal/GU negative Renal ROSLab Results      Component                Value               Date                      CREATININE               0.90                11/18/2019                Musculoskeletal  (+) Arthritis ,   Abdominal   Peds  Hematology  (+) Blood dyscrasia, anemia , Lab Results      Component                Value               Date                      WBC                      3.8 (L)             11/18/2019                HGB                      12.8 (L)            11/18/2019                HCT                       38.8 (L)            11/18/2019                MCV                      97.7  11/18/2019                PLT                      244                 11/18/2019              Anesthesia Other Findings Patient is a 64 year old male scheduled for the above procedure.He is s/p nasal septoplasty and bilateral inferior turbinate reduction on 10/17/19. S/p CABG 06/04/17. He has 2 protruding sternal wires that are causing significant discomfort, so the above procedure recommended.   History includes never smoker, HTN, CAD (s/p CABG: LIMA-LAD, SVG-DIAG, SVG-CX 06/04/17), COPD, asthma, OSA (CPAP), PTSD, GERD, skin cancer (BCC), knee surgery (right knee arthroscopy 04/29/08).He reported apriorknee surgery at the [facility] in which he "stopped breathing."Per previous anesthesia APP documentation in 2019, it soundedlike it may have been a combination of sedation and OSA, as he told his PAT RN that he was told to use his CPAP.  Last cardiology evaluation 10/08/2019 by Kerin Ransom, PA-C for preoperative evaluation for nasal septoplasty. Patient had not been seen there since December 2019, but have been doing well from a cardiac standpoint. He was walking at least 3 times a week for exercise. No chest pain or shortness of breath. He was felt "acceptable risk" for that procedure without further preoperative cardiac work-up.   Reproductive/Obstetrics                           Anesthesia Physical Anesthesia Plan  ASA: III  Anesthesia Plan: MAC   Post-op Pain Management:    Induction: Intravenous  PONV Risk Score and Plan: 1 and Treatment may vary due to age or medical condition and Propofol infusion  Airway Management Planned: Nasal Cannula  Additional Equipment: None  Intra-op Plan:   Post-operative Plan:   Informed Consent: I have reviewed the patients History and Physical, chart, labs and discussed the procedure including the risks, benefits  and alternatives for the proposed anesthesia with the patient or authorized representative who has indicated his/her understanding and acceptance.     Dental advisory given  Plan Discussed with: CRNA and Surgeon  Anesthesia Plan Comments: (PAT note written 11/19/2019 by Myra Gianotti, PA-C. )       Anesthesia Quick Evaluation

## 2019-11-20 ENCOUNTER — Telehealth: Payer: Self-pay

## 2019-11-20 ENCOUNTER — Ambulatory Visit (HOSPITAL_COMMUNITY): Payer: No Typology Code available for payment source | Admitting: Vascular Surgery

## 2019-11-20 ENCOUNTER — Encounter (HOSPITAL_COMMUNITY): Payer: Self-pay | Admitting: Cardiothoracic Surgery

## 2019-11-20 ENCOUNTER — Ambulatory Visit (HOSPITAL_COMMUNITY): Payer: No Typology Code available for payment source | Admitting: Certified Registered Nurse Anesthetist

## 2019-11-20 ENCOUNTER — Other Ambulatory Visit: Payer: Self-pay

## 2019-11-20 ENCOUNTER — Other Ambulatory Visit: Payer: Self-pay | Admitting: Cardiothoracic Surgery

## 2019-11-20 ENCOUNTER — Ambulatory Visit (HOSPITAL_COMMUNITY)
Admission: RE | Admit: 2019-11-20 | Discharge: 2019-11-20 | Disposition: A | Discharge status: Home / Self Care | Payer: No Typology Code available for payment source | Attending: Cardiothoracic Surgery | Admitting: Cardiothoracic Surgery

## 2019-11-20 ENCOUNTER — Encounter (HOSPITAL_COMMUNITY)
Admission: RE | Disposition: A | Discharge status: Home / Self Care | Payer: Self-pay | Source: Home / Self Care | Attending: Cardiothoracic Surgery

## 2019-11-20 DIAGNOSIS — Z8249 Family history of ischemic heart disease and other diseases of the circulatory system: Secondary | ICD-10-CM | POA: Diagnosis not present

## 2019-11-20 DIAGNOSIS — G4730 Sleep apnea, unspecified: Secondary | ICD-10-CM | POA: Insufficient documentation

## 2019-11-20 DIAGNOSIS — M1990 Unspecified osteoarthritis, unspecified site: Secondary | ICD-10-CM | POA: Insufficient documentation

## 2019-11-20 DIAGNOSIS — Z951 Presence of aortocoronary bypass graft: Secondary | ICD-10-CM | POA: Diagnosis not present

## 2019-11-20 DIAGNOSIS — R0782 Intercostal pain: Secondary | ICD-10-CM

## 2019-11-20 DIAGNOSIS — I2510 Atherosclerotic heart disease of native coronary artery without angina pectoris: Secondary | ICD-10-CM | POA: Insufficient documentation

## 2019-11-20 DIAGNOSIS — T85848A Pain due to other internal prosthetic devices, implants and grafts, initial encounter: Secondary | ICD-10-CM | POA: Diagnosis not present

## 2019-11-20 DIAGNOSIS — Y828 Other medical devices associated with adverse incidents: Secondary | ICD-10-CM | POA: Insufficient documentation

## 2019-11-20 DIAGNOSIS — Z85828 Personal history of other malignant neoplasm of skin: Secondary | ICD-10-CM | POA: Diagnosis not present

## 2019-11-20 DIAGNOSIS — T8189XA Other complications of procedures, not elsewhere classified, initial encounter: Secondary | ICD-10-CM

## 2019-11-20 DIAGNOSIS — I10 Essential (primary) hypertension: Secondary | ICD-10-CM | POA: Diagnosis not present

## 2019-11-20 DIAGNOSIS — J449 Chronic obstructive pulmonary disease, unspecified: Secondary | ICD-10-CM | POA: Insufficient documentation

## 2019-11-20 HISTORY — PX: STERNAL WIRES REMOVAL: SHX2441

## 2019-11-20 SURGERY — REMOVAL, STERNAL WIRE
Anesthesia: General | Site: Chest

## 2019-11-20 MED ORDER — OXYCODONE HCL 5 MG PO TABS
ORAL_TABLET | ORAL | Status: DC
Start: 2019-11-20 — End: 2019-11-20
  Filled 2019-11-20: qty 1

## 2019-11-20 MED ORDER — MIDAZOLAM HCL 5 MG/5ML IJ SOLN
INTRAMUSCULAR | Status: DC | PRN
  Administered 2019-11-20: 2 mg via INTRAVENOUS

## 2019-11-20 MED ORDER — CHLORHEXIDINE GLUCONATE 0.12 % MT SOLN: 15.0000 mL | Freq: Once | OROMUCOSAL | Status: AC

## 2019-11-20 MED ORDER — ACETAMINOPHEN 325 MG PO TABS: 650.0000 mg | ORAL_TABLET | ORAL | Status: DC | PRN

## 2019-11-20 MED ORDER — ONDANSETRON HCL 4 MG/2ML IJ SOLN
INTRAMUSCULAR | Status: DC | PRN
  Administered 2019-11-20: 4 mg via INTRAVENOUS

## 2019-11-20 MED ORDER — SODIUM CHLORIDE 0.9% FLUSH: 3.0000 mL | INTRAVENOUS | Status: DC | PRN

## 2019-11-20 MED ORDER — ORAL CARE MOUTH RINSE: 15.0000 mL | Freq: Once | OROMUCOSAL | Status: AC

## 2019-11-20 MED ORDER — FENTANYL CITRATE (PF) 100 MCG/2ML IJ SOLN: 25.0000 ug | INTRAMUSCULAR | Status: DC | PRN

## 2019-11-20 MED ORDER — PHENYLEPHRINE 40 MCG/ML (10ML) SYRINGE FOR IV PUSH (FOR BLOOD PRESSURE SUPPORT)
PREFILLED_SYRINGE | INTRAVENOUS | Status: DC | PRN
  Administered 2019-11-20 (×3): 80 ug via INTRAVENOUS

## 2019-11-20 MED ORDER — ACETAMINOPHEN 650 MG RE SUPP: 650.0000 mg | RECTAL | Status: DC | PRN

## 2019-11-20 MED ORDER — ACETAMINOPHEN 160 MG/5ML PO SOLN: 1000.0000 mg | Freq: Once | ORAL | Status: DC | PRN

## 2019-11-20 MED ORDER — PROPOFOL 10 MG/ML IV BOLUS
INTRAVENOUS | Status: DC | PRN
  Administered 2019-11-20: 100 mg via INTRAVENOUS

## 2019-11-20 MED ORDER — OXYCODONE HCL 5 MG PO TABS: 5.0000 mg | ORAL_TABLET | ORAL | Status: DC | PRN

## 2019-11-20 MED ORDER — KETOROLAC TROMETHAMINE 30 MG/ML IJ SOLN: 30.0000 mg | Freq: Four times a day (QID) | INTRAMUSCULAR | Status: DC

## 2019-11-20 MED ORDER — ACETAMINOPHEN 10 MG/ML IV SOLN: 1000.0000 mg | Freq: Once | INTRAVENOUS | Status: DC | PRN

## 2019-11-20 MED ORDER — LACTATED RINGERS IV SOLN: INTRAVENOUS | Status: DC

## 2019-11-20 MED ORDER — OXYCODONE HCL 5 MG/5ML PO SOLN: 5.0000 mg | Freq: Once | ORAL | Status: AC | PRN

## 2019-11-20 MED ORDER — ONDANSETRON HCL 4 MG/2ML IJ SOLN
INTRAMUSCULAR | Status: AC
  Filled 2019-11-20: qty 2

## 2019-11-20 MED ORDER — LIDOCAINE 2% (20 MG/ML) 5 ML SYRINGE
INTRAMUSCULAR | Status: DC | PRN
  Administered 2019-11-20: 20 mg via INTRAVENOUS

## 2019-11-20 MED ORDER — SODIUM CHLORIDE 0.9% FLUSH: 3.0000 mL | Freq: Two times a day (BID) | INTRAVENOUS | Status: DC

## 2019-11-20 MED ORDER — KETAMINE HCL 10 MG/ML IJ SOLN
INTRAMUSCULAR | Status: DC | PRN
  Administered 2019-11-20 (×2): 10 mg via INTRAVENOUS

## 2019-11-20 MED ORDER — PHENYLEPHRINE HCL-NACL 10-0.9 MG/250ML-% IV SOLN
INTRAVENOUS | Status: DC | PRN
  Administered 2019-11-20: 40 ug/min via INTRAVENOUS

## 2019-11-20 MED ORDER — CEFAZOLIN SODIUM-DEXTROSE 2-4 GM/100ML-% IV SOLN
2.0000 g | INTRAVENOUS | Status: AC
  Administered 2019-11-20: 2 g via INTRAVENOUS

## 2019-11-20 MED ORDER — SODIUM CHLORIDE 0.9 % IR SOLN
Status: DC | PRN
  Administered 2019-11-20: 1000 mL

## 2019-11-20 MED ORDER — SODIUM CHLORIDE 0.9 % IV SOLN: 250.0000 mL | INTRAVENOUS | Status: DC | PRN

## 2019-11-20 MED ORDER — KETAMINE HCL 50 MG/5ML IJ SOSY
PREFILLED_SYRINGE | INTRAMUSCULAR | Status: AC
  Filled 2019-11-20: qty 5

## 2019-11-20 MED ORDER — OXYCODONE HCL 5 MG PO TABS
5.0000 mg | ORAL_TABLET | Freq: Once | ORAL | Status: AC | PRN
  Administered 2019-11-20: 5 mg via ORAL

## 2019-11-20 MED ORDER — PROPOFOL 500 MG/50ML IV EMUL
INTRAVENOUS | Status: DC | PRN
  Administered 2019-11-20: 150 ug/kg/min via INTRAVENOUS

## 2019-11-20 MED ORDER — MIDAZOLAM HCL 2 MG/2ML IJ SOLN
INTRAMUSCULAR | Status: AC
  Filled 2019-11-20: qty 2

## 2019-11-20 MED ORDER — CEFAZOLIN SODIUM-DEXTROSE 2-4 GM/100ML-% IV SOLN
INTRAVENOUS | Status: AC
  Filled 2019-11-20: qty 100

## 2019-11-20 MED ORDER — BUPIVACAINE HCL (PF) 0.5 % IJ SOLN
INTRAMUSCULAR | Status: AC
  Filled 2019-11-20: qty 30

## 2019-11-20 MED ORDER — CHLORHEXIDINE GLUCONATE 0.12 % MT SOLN
OROMUCOSAL | Status: AC
  Administered 2019-11-20: 15 mL via OROMUCOSAL
  Filled 2019-11-20: qty 15

## 2019-11-20 MED ORDER — ACETAMINOPHEN 500 MG PO TABS: 1000.0000 mg | ORAL_TABLET | Freq: Once | ORAL | Status: DC | PRN

## 2019-11-20 SURGICAL SUPPLY — 32 items
BAG DECANTER FOR FLEXI CONT (MISCELLANEOUS) ×3 IMPLANT
BLADE CLIPPER SURG (BLADE) ×3 IMPLANT
BLADE SURG 10 STRL SS (BLADE) ×6 IMPLANT
CANISTER SUCT 3000ML PPV (MISCELLANEOUS) ×3 IMPLANT
DRAPE LAPAROSCOPIC ABDOMINAL (DRAPES) ×3 IMPLANT
DRSG TEGADERM 4X4.75 (GAUZE/BANDAGES/DRESSINGS) ×3 IMPLANT
DRSG XEROFORM 1X8 (GAUZE/BANDAGES/DRESSINGS) ×3 IMPLANT
ELECT REM PT RETURN 9FT ADLT (ELECTROSURGICAL) ×3
ELECTRODE REM PT RTRN 9FT ADLT (ELECTROSURGICAL) ×1 IMPLANT
GAUZE SPONGE 2X2 8PLY STRL LF (GAUZE/BANDAGES/DRESSINGS) ×1 IMPLANT
GLOVE BIO SURGEON STRL SZ 6.5 (GLOVE) ×6 IMPLANT
GLOVE BIO SURGEON STRL SZ7.5 (GLOVE) ×6 IMPLANT
GLOVE BIO SURGEONS STRL SZ 6.5 (GLOVE) ×3
GOWN STRL REUS W/ TWL LRG LVL3 (GOWN DISPOSABLE) ×4 IMPLANT
GOWN STRL REUS W/TWL LRG LVL3 (GOWN DISPOSABLE) ×8
KIT BASIN OR (CUSTOM PROCEDURE TRAY) ×3 IMPLANT
KIT TURNOVER KIT B (KITS) ×3 IMPLANT
MARKER SKIN DUAL TIP RULER LAB (MISCELLANEOUS) ×3 IMPLANT
NS IRRIG 1000ML POUR BTL (IV SOLUTION) ×3 IMPLANT
PACK CHEST (CUSTOM PROCEDURE TRAY) ×3 IMPLANT
PAD ARMBOARD 7.5X6 YLW CONV (MISCELLANEOUS) ×6 IMPLANT
SOL PREP POV-IOD 4OZ 10% (MISCELLANEOUS) ×3 IMPLANT
SPONGE GAUZE 2X2 8PLY STER LF (GAUZE/BANDAGES/DRESSINGS) ×1
SPONGE GAUZE 2X2 8PLY STRL LF (GAUZE/BANDAGES/DRESSINGS) ×2 IMPLANT
SPONGE GAUZE 2X2 STER 10/PKG (GAUZE/BANDAGES/DRESSINGS) ×2
SUT ETHILON 3 0 FSL (SUTURE) ×9 IMPLANT
SUT VIC AB 2-0 CTX 27 (SUTURE) ×3 IMPLANT
SUT VIC AB 3-0 SH 8-18 (SUTURE) ×3 IMPLANT
SUT VIC AB 3-0 X1 27 (SUTURE) ×3 IMPLANT
TOWEL GREEN STERILE (TOWEL DISPOSABLE) ×3 IMPLANT
TOWEL GREEN STERILE FF (TOWEL DISPOSABLE) ×3 IMPLANT
WATER STERILE IRR 1000ML POUR (IV SOLUTION) ×3 IMPLANT

## 2019-11-20 NOTE — Op Note (Signed)
NAME: Elijah Knapp, Elijah Knapp MEDICAL RECORD IW:[id number] ACCOUNT [id number] DATE OF BIRTH:1955/05/30 FACILITY: [facility] LOCATION: [facility] PHYSICIAN:Anuj Summons VAN TRIGT III, MD  OPERATIVE REPORT  DATE OF PROCEDURE:  11/20/2019  OPERATION:  Removal of sternal wires (2).  SURGEON:  Ivin Poot, MD  ANESTHESIA:  General.  PREOPERATIVE DIAGNOSIS:  Uncomfortable protruding sternal wires, status post coronary artery bypass graft over a year ago.  POSTOPERATIVE DIAGNOSIS:  Uncomfortable protruding sternal wires, status post coronary artery bypass graft over a year ago.  DESCRIPTION OF PROCEDURE:  The patient was brought from preoperative holding where informed consent was documented and final issues addressed with the patient regarding the procedure, including the expected benefits, alternatives and risks.  He was  placed supine on the operating table where general anesthesia was induced.  He remained stable.  The chest and neck were prepped and draped as a sterile field.  A proper time-out was performed.  A small incision was made over the upper sternal incision.   The 2 protruding sternal wires were freed up from the connective tissue ,divided and completely removed.  The sternum was completely healed and bonded.  The incision was irrigated.  It was closed in layers using interrupted 3-0 Vicryl for the fascia,  running Vicryl for subcutaneous and interrupted nylon sutures for the skin.  Sterile dressing was applied.  The patient was reversed from anesthesia, extubated and returned to the recovery room in stable condition.  VN/NUANCE  D:11/20/2019 T:11/20/2019 JOB:[id number]

## 2019-11-20 NOTE — OR Nursing (Signed)
Surgeon removed top two sternal wires. Rest of wires intact.

## 2019-11-20 NOTE — Anesthesia Procedure Notes (Signed)
Procedure Name: LMA Insertion °Performed by: Claris Pech H, CRNA °Pre-anesthesia Checklist: Patient identified, Emergency Drugs available, Suction available and Patient being monitored °Patient Re-evaluated:Patient Re-evaluated prior to induction °Oxygen Delivery Method: Circle System Utilized °Preoxygenation: Pre-oxygenation with 100% oxygen °Induction Type: IV induction °Ventilation: Mask ventilation without difficulty °LMA: LMA inserted °LMA Size: 4.0 °Number of attempts: 1 °Airway Equipment and Method: Bite block °Placement Confirmation: positive ETCO2 °Tube secured with: Tape °Dental Injury: Teeth and Oropharynx as per pre-operative assessment  ° ° ° ° ° ° °

## 2019-11-20 NOTE — Brief Op Note (Signed)
11/20/2019  8:46 AM  PATIENT:  Elijah Knapp  64 y.o. male  PRE-OPERATIVE DIAGNOSIS:  STERNAL PAIN  POST-OPERATIVE DIAGNOSIS:  STERNAL PAIN  PROCEDURE:  Procedure(s): STERNAL WIRES REMOVAL (N/A)  SURGEON:  Surgeon(s) and Role:    Ivin Poot, MD - Primary  PHYSICIAN ASSISTANT:   ASSISTANTS: none   ANESTHESIA:   general  EBL: 5 ml  BLOOD ADMINISTERED:none  DRAINS: none   LOCAL MEDICATIONS USED:  NONE  SPECIMEN:  No Specimen  DISPOSITION OF SPECIMEN:  N/A  COUNTS:  YES  TOURNIQUET:  * No tourniquets in log *  DICTATION: .Dragon Dictation  PLAN OF CARE: Discharge to home after PACU  PATIENT DISPOSITION:  PACU - hemodynamically stable.   Delay start of Pharmacological VTE agent (>24hrs) due to surgical blood loss or risk of bleeding: yes

## 2019-11-20 NOTE — H&P (Signed)
PCP is [facility] Referring Provider is No ref. provider found  No chief complaint on file.   HPI: Patient is status post multivessel CABG April 2021. The upper sternal wires are protruding and are uncomfortable.  Elijah Knapp to have them removed.  We will set him up as an outpatient to perform this under MAC anesthesia.  Sternal wound is completely healed there is no evidence of cellulitis.  It is a matter of discomfort from the 2 top sternal wires protruding out of the skin.  The patient recently had nasal septoplasty by Elijah Knapp and is completely recovered. Past Medical History:  Diagnosis Date  . Arthritis   . Asthma   . Cancer ([place])    basal cell skin ca  . Chronic lung disease   . Complication of anesthesia    states Elijah "stopped breathing" during knee surgery.   [name] COPD (chronic obstructive pulmonary disease) ([place])   . Coronary artery disease   . GERD (gastroesophageal reflux disease)   . Hypertension   . Leg cramps   . PTSD (post-traumatic stress disorder)   . Sleep apnea    uses cpap    Past Surgical History:  Procedure Laterality Date  . CARDIAC CATHETERIZATION Left 05/28/2017  . CORONARY ARTERY BYPASS GRAFT N/A 06/04/2017   Procedure: CORONARY ARTERY BYPASS GRAFTING (CABG) x 3 WITH ENDOSCOPIC HARVESTING OF RIGHT SAPHENOUS VEIN;  Surgeon: Elijah Poot, MD;  Location: [place];  Service: Open Heart Surgery;  Laterality: N/A;  . KNEE ARTHROSCOPY W/ MENISCAL REPAIR Right   . KNEE SURGERY Left   . LEFT HEART CATH AND CORONARY ANGIOGRAPHY N/A 05/28/2017   Procedure: LEFT HEART CATH AND CORONARY ANGIOGRAPHY;  Surgeon: Elijah Harp, MD;  Location: [place] [facility];  Service: Cardiovascular;  Laterality: N/A;  . NASAL SEPTOPLASTY W/ TURBINOPLASTY Bilateral 10/17/2019   Procedure: NASAL SEPTOPLASTY WITH TURBINATE REDUCTION;  Surgeon: Elijah Belfast, MD;  Location: [place];  Service: ENT;  Laterality: Bilateral;  NASAL SEPTOPLASTY WITH TURBINATE REDUCTION   . TEE WITHOUT CARDIOVERSION N/A 06/04/2017   Procedure: TRANSESOPHAGEAL ECHOCARDIOGRAM (TEE);  Surgeon: Elijah Knapp, Elijah Salina, MD;  Location: [place];  Service: Open Heart Surgery;  Laterality: N/A;    Family History  Problem Relation Age of Onset  . Alzheimer's disease Mother   . Heart disease Father     Social History Social History   Tobacco Use  . Smoking status: Never Smoker  . Smokeless tobacco: Never Used  Vaping Use  . Vaping Use: Never used  Substance Use Topics  . Alcohol use: Yes    Alcohol/week: 21.0 standard drinks    Types: 7 Glasses of wine, 14 Cans of beer per week  . Drug use: Never    Current Facility-Administered Medications  Medication Dose Route Frequency Provider Last Rate Last Admin  . ceFAZolin (ANCEF) 2-4 GM/100ML-% IVPB           . ceFAZolin (ANCEF) IVPB 2g/100 mL premix  2 g Intravenous 30 min Pre-Op Elijah Knapp, Elijah Salina, MD      . lactated ringers infusion   Intravenous Continuous Elijah Mouse, MD      . sodium chloride irrigation 0.9 %    PRN Elijah Knapp, Elijah Salina, MD   1,000 mL at 11/20/19 0704    No Known Allergies  Review of Systems  No angina No shortness of breath Patient doubly vaccinated against COVID-19 No orthopnea No fever No cough chest pain or loss of taste or smell  BP 130/65  Pulse 60   Temp 97.8 F (36.6 C) (Temporal)   Resp 18   Ht _0  (1.702 m)   Wt 72.6 kg   SpO2 99%   BMI 25.06 kg/m  Physical Exam      Exam    General- alert and comfortable    Neck- no JVD, no cervical adenopathy palpable, no carotid bruit   Lungs- clear without rales, wheezes   Cor- regular rate and rhythm, no murmur , gallop   Abdomen- soft, non-tender   Extremities - warm, non-tender, minimal edema   Neuro- oriented, appropriate, no focal weakness   Diagnostic Tests: None  Impression: Significant discomfort from protruding the upper 2 sternal wires now over 5 months postop.  The wires can be removed as the sternum is now completely  bonded.  Plan: Patient be scheduled for sternal wire removal as an outpatient on September 16 at [place].   Len Childs, MD [facility] [phone]   Pre Procedure note for inpatients:   Elijah Knapp has been scheduled for Procedure(s): STERNAL WIRES REMOVAL (N/A) today. The various methods of treatment have been discussed with the patient. After consideration of the risks, benefits and treatment options the patient has consented to the planned procedure.   The patient has been seen and labs reviewed. There are no changes in the patient's condition to prevent proceeding with the planned procedure today.  Recent labs:  Lab Results  Component Value Date   WBC 3.8 (L) 11/18/2019   HGB 12.8 (L) 11/18/2019   HCT 38.8 (L) 11/18/2019   PLT 244 11/18/2019   GLUCOSE 113 (H) 11/18/2019   ALT 24 11/18/2019   AST 22 11/18/2019   NA 137 11/18/2019   K 4.2 11/18/2019   CL 104 11/18/2019   CREATININE 0.90 11/18/2019   BUN 16 11/18/2019   CO2 26 11/18/2019   TSH 2.940 05/24/2017   INR 0.9 11/18/2019   HGBA1C 5.4 05/31/2017    Len Childs, MD 11/20/2019 7:43 AM

## 2019-11-20 NOTE — Telephone Encounter (Signed)
Tramadol 50 mg RX called to [facility] 1 tablet every 6 hours prn #28, no refills

## 2019-11-20 NOTE — Discharge Instructions (Signed)
No driving 24 hrs Keep surgical dressing intact until sat am 9-18 May shower starting 9-18- cover incision with large bandaidf Office will call about wound check and suture removal in about a week

## 2019-11-20 NOTE — Transfer of Care (Signed)
Immediate Anesthesia Transfer of Care Note  Patient: Elijah Knapp  Procedure(s) Performed: STERNAL WIRES REMOVAL (N/A Chest)  Patient Location: PACU  Anesthesia Type:General  Level of Consciousness: drowsy  Airway & Oxygen Therapy: Patient Spontanous Breathing  Post-op Assessment: Report given to RN and Post -op Vital signs reviewed and stable  Post vital signs: Reviewed and stable  Last Vitals:  Vitals Value Taken Time  BP 117/75 11/20/19 0850  Temp    Pulse 65 11/20/19 0851  Resp 14 11/20/19 0851  SpO2 98 % 11/20/19 0851  Vitals shown include unvalidated device data.  Last Pain:  Vitals:   11/20/19 0617  TempSrc: Temporal  PainSc:          Complications: No complications documented.

## 2019-11-21 ENCOUNTER — Encounter (HOSPITAL_COMMUNITY): Payer: Self-pay | Admitting: Cardiothoracic Surgery

## 2019-11-24 ENCOUNTER — Encounter (HOSPITAL_COMMUNITY): Payer: Self-pay | Admitting: Cardiothoracic Surgery

## 2019-11-24 NOTE — Anesthesia Postprocedure Evaluation (Signed)
Anesthesia Post Note  Patient: Elijah Knapp  Procedure(s) Performed: STERNAL WIRES REMOVAL (N/A Chest)     Patient location during evaluation: PACU Anesthesia Type: General Level of consciousness: awake and alert Pain management: pain level controlled Vital Signs Assessment: post-procedure vital signs reviewed and stable Respiratory status: spontaneous breathing, nonlabored ventilation, respiratory function stable and patient connected to nasal cannula oxygen Cardiovascular status: blood pressure returned to baseline and stable Postop Assessment: no apparent nausea or vomiting Anesthetic complications: no   No complications documented.  Last Vitals:  Vitals:   11/20/19 0946 11/20/19 0949  BP:  138/64  Pulse: 63   Resp: (!) 21 13  Temp:  (!) 36.3 C  SpO2: 99%     Last Pain:  Vitals:   11/20/19 0915  TempSrc:   PainSc: 0-No pain                 Demareon Coldwell

## 2019-11-26 ENCOUNTER — Ambulatory Visit: Payer: No Typology Code available for payment source | Admitting: Cardiothoracic Surgery

## 2019-12-03 ENCOUNTER — Ambulatory Visit (INDEPENDENT_AMBULATORY_CARE_PROVIDER_SITE_OTHER): Payer: Self-pay | Admitting: Cardiothoracic Surgery

## 2019-12-03 ENCOUNTER — Encounter: Payer: Self-pay | Admitting: Cardiothoracic Surgery

## 2019-12-03 ENCOUNTER — Other Ambulatory Visit: Payer: Self-pay

## 2019-12-03 VITALS — BP 99/67 | HR 76 | Temp 97.9°F | Resp 20 | Ht 67.0 in | Wt 165.0 lb

## 2019-12-03 DIAGNOSIS — Z4802 Encounter for removal of sutures: Secondary | ICD-10-CM | POA: Insufficient documentation

## 2019-12-03 DIAGNOSIS — Z09 Encounter for follow-up examination after completed treatment for conditions other than malignant neoplasm: Secondary | ICD-10-CM

## 2019-12-03 NOTE — Progress Notes (Signed)
[facility] Referring Provider is [facility]  Chief Complaint  Patient presents with  . Routine Post Op    s/p sternal wire removal 11/20/19    HPI: Patient returns for suture removal after upper sternal wires have been removed for protruding into the skin.  Incision is healed well and sutures are removed sterile dressing was applied.  Past Medical History:  Diagnosis Date  . Arthritis   . Asthma   . Cancer ([place])    basal cell skin ca  . Chronic lung disease   . Complication of anesthesia    states he "stopped breathing" during knee surgery.   [name] COPD (chronic obstructive pulmonary disease) ([place])   . Coronary artery disease   . GERD (gastroesophageal reflux disease)   . Hypertension   . Leg cramps   . PTSD (post-traumatic stress disorder)   . Sleep apnea    uses cpap    Past Surgical History:  Procedure Laterality Date  . CARDIAC CATHETERIZATION Left 05/28/2017  . CORONARY ARTERY BYPASS GRAFT N/A 06/04/2017   Procedure: CORONARY ARTERY BYPASS GRAFTING (CABG) x 3 WITH ENDOSCOPIC HARVESTING OF RIGHT SAPHENOUS VEIN;  Surgeon: Ivin Poot, MD;  Location: [place];  Service: Open Heart Surgery;  Laterality: N/A;  . KNEE ARTHROSCOPY W/ MENISCAL REPAIR Right   . KNEE SURGERY Left   . LEFT HEART CATH AND CORONARY ANGIOGRAPHY N/A 05/28/2017   Procedure: LEFT HEART CATH AND CORONARY ANGIOGRAPHY;  Surgeon: Lorretta Harp, MD;  Location: [place] [facility];  Service: Cardiovascular;  Laterality: N/A;  . NASAL SEPTOPLASTY W/ TURBINOPLASTY Bilateral 10/17/2019   Procedure: NASAL SEPTOPLASTY WITH TURBINATE REDUCTION;  Surgeon: [name], MD;  Location: [place];  Service: ENT;  Laterality: Bilateral;  NASAL SEPTOPLASTY WITH TURBINATE REDUCTION  . STERNAL WIRES REMOVAL N/A 11/20/2019   Procedure: STERNAL WIRES REMOVAL;  Surgeon: Ivin Poot, MD;  Location: [place];  Service: Thoracic;  Laterality: N/A;  . TEE WITHOUT CARDIOVERSION N/A 06/04/2017    Procedure: TRANSESOPHAGEAL ECHOCARDIOGRAM (TEE);  Surgeon: Prescott Gum, Collier Salina, MD;  Location: [place];  Service: Open Heart Surgery;  Laterality: N/A;    Family History  Problem Relation Age of Onset  . Alzheimer's disease Mother   . Heart disease Father     Social History Social History   Tobacco Use  . Smoking status: Never Smoker  . Smokeless tobacco: Never Used  Vaping Use  . Vaping Use: Never used  Substance Use Topics  . Alcohol use: Yes    Alcohol/week: 21.0 standard drinks    Types: 7 Glasses of wine, 14 Cans of beer per week  . Drug use: Never    Current Outpatient Medications  Medication Sig Dispense Refill  . aspirin EC 81 MG tablet Take 81 mg by mouth daily. Swallow whole.    [name] atorvastatin (LIPITOR) 20 MG tablet Take 20 mg by mouth at bedtime.     . budesonide-formoterol (SYMBICORT) 80-4.5 MCG/ACT inhaler Inhale 2 puffs into the lungs 2 (two) times daily as needed (respiratory issues.).    [name] cefUROXime (CEFTIN) 250 MG tablet Take 250 mg by mouth daily as needed (allergies).     . cetirizine (ZYRTEC) 10 MG tablet Take 10 mg by mouth at bedtime as needed for allergies.    . cholecalciferol (VITAMIN D3) 25 MCG (1000 UNIT) tablet Take 1,000 Units by mouth daily.     . finasteride (PROSCAR) 5 MG tablet Take 2.5 mg by mouth daily.     [name]  fluocinonide ointment (LIDEX) 0.35 % Apply 1 application topically 2 (two) times daily as needed (skin irritations (hands)).     . fluticasone (FLONASE) 50 MCG/ACT nasal spray Place 1-2 sprays into both nostrils daily as needed (runny nose/allergies.).     [name] hydrocortisone 2.5 % ointment Apply 1 application topically 2 (two) times daily as needed (skin irritations (hands)).     [name] losartan (COZAAR) 100 MG tablet Take 50 mg by mouth daily.     . metoprolol tartrate (LOPRESSOR) 50 MG tablet Take 25 mg by mouth 2 (two) times daily.     [name] omeprazole (PRILOSEC) 20 MG capsule Take 20 mg by mouth daily before breakfast.     . sildenafil (VIAGRA) 100  MG tablet Take 100 mg by mouth daily as needed for erectile dysfunction.     . sodium chloride (OCEAN) 0.65 % SOLN nasal spray Place 1 spray into both nostrils 4 (four) times daily as needed for congestion.    [name] ALPRAZolam (XANAX) 0.5 MG tablet Take 1 tablet (0.5 mg total) by mouth at bedtime as needed for anxiety. (Patient not taking: Reported on 11/12/2019) 10 tablet 0   No current facility-administered medications for this visit.    No Known Allergies  Review of Systems  No new complaints  BP 99/67   Pulse 76   Temp 97.9 F (36.6 C)   Resp 20   Ht 5\' 7"  (1.702 m)   Wt 165 lb (74.8 kg)   SpO2 93%   BMI 25.84 kg/m  Physical Exam      Exam    General- alert and comfortable.  Incision over upper sternal wound healing appropriately    Neck- no JVD, no cervical adenopathy palpable, no carotid bruit   Lungs- clear without rales, wheezes   Cor- regular rate and rhythm, no murmur , gallop   Abdomen- soft, non-tender   Extremities - warm, non-tender, minimal edema   Neuro- oriented, appropriate, no focal weakness   Diagnostic Tests: None  Impression: Suture removal of upper sternal incision after sternal wire removal  Plan: Leave dressing on for 48 hours then regular skin care.  Call if needed.  Len Childs, MD [facility] [phone]

## 2020-01-22 ENCOUNTER — Telehealth: Payer: Self-pay | Admitting: *Deleted

## 2020-01-22 NOTE — Telephone Encounter (Signed)
Pt arrived to the office stating he had a suture coming through his sternal incision. Pt is s/p sternal wire removal 11/20/19. Upon observation, sternal incision is completely healed. Two very small black dots are seen laterally to the left of the sternal incision. These two black dots appear to be two small scabs. Pt reassured that these areas of concern are not sutures. Pt accepts and leaves relieved of worry.

## 2020-04-06 ENCOUNTER — Ambulatory Visit: Payer: No Typology Code available for payment source | Admitting: Cardiovascular Disease

## 2021-05-09 ENCOUNTER — Emergency Department (HOSPITAL_COMMUNITY): Payer: No Typology Code available for payment source

## 2021-05-09 ENCOUNTER — Other Ambulatory Visit: Payer: Self-pay

## 2021-05-09 ENCOUNTER — Encounter (HOSPITAL_COMMUNITY): Payer: Self-pay

## 2021-05-09 ENCOUNTER — Emergency Department (HOSPITAL_COMMUNITY)
Admission: EM | Admit: 2021-05-09 | Discharge: 2021-05-09 | Disposition: A | Discharge status: Home / Self Care | Payer: No Typology Code available for payment source | Attending: Emergency Medicine | Admitting: Emergency Medicine

## 2021-05-09 DIAGNOSIS — J45909 Unspecified asthma, uncomplicated: Secondary | ICD-10-CM | POA: Insufficient documentation

## 2021-05-09 DIAGNOSIS — R0789 Other chest pain: Secondary | ICD-10-CM | POA: Diagnosis not present

## 2021-05-09 DIAGNOSIS — Z7982 Long term (current) use of aspirin: Secondary | ICD-10-CM | POA: Insufficient documentation

## 2021-05-09 DIAGNOSIS — I2510 Atherosclerotic heart disease of native coronary artery without angina pectoris: Secondary | ICD-10-CM | POA: Insufficient documentation

## 2021-05-09 DIAGNOSIS — I10 Essential (primary) hypertension: Secondary | ICD-10-CM | POA: Insufficient documentation

## 2021-05-09 DIAGNOSIS — R079 Chest pain, unspecified: Secondary | ICD-10-CM

## 2021-05-09 DIAGNOSIS — Z79899 Other long term (current) drug therapy: Secondary | ICD-10-CM | POA: Diagnosis not present

## 2021-05-09 DIAGNOSIS — J449 Chronic obstructive pulmonary disease, unspecified: Secondary | ICD-10-CM | POA: Insufficient documentation

## 2021-05-09 LAB — CBC
HCT: 37.8 % — ABNORMAL LOW (ref 39.0–52.0)
Hemoglobin: 13 g/dL (ref 13.0–17.0)
MCH: 32.7 pg (ref 26.0–34.0)
MCHC: 34.4 g/dL (ref 30.0–36.0)
MCV: 95.2 fL (ref 80.0–100.0)
Platelets: 229 10*3/uL (ref 150–400)
RBC: 3.97 MIL/uL — ABNORMAL LOW (ref 4.22–5.81)
RDW: 12.3 % (ref 11.5–15.5)
WBC: 4.5 10*3/uL (ref 4.0–10.5)
nRBC: 0 % (ref 0.0–0.2)

## 2021-05-09 LAB — BASIC METABOLIC PANEL
Anion gap: 10 (ref 5–15)
BUN: 8 mg/dL (ref 8–23)
CO2: 25 mmol/L (ref 22–32)
Calcium: 9.4 mg/dL (ref 8.9–10.3)
Chloride: 97 mmol/L — ABNORMAL LOW (ref 98–111)
Creatinine, Ser: 0.92 mg/dL (ref 0.61–1.24)
GFR, Estimated: >60 mL/min (ref >60)
Glucose, Bld: 173 mg/dL — ABNORMAL HIGH (ref 70–99)
Potassium: 4.1 mmol/L (ref 3.5–5.1)
Sodium: 132 mmol/L — ABNORMAL LOW (ref 135–145)

## 2021-05-09 LAB — TROPONIN I (HIGH SENSITIVITY)
Troponin I (High Sensitivity): 7 ng/L (ref <18)
Troponin I (High Sensitivity): 8 ng/L (ref <18)

## 2021-05-09 NOTE — ED Notes (Signed)
Help get patient into a gown on the monitor patient is resting with call bell in reach 

## 2021-05-09 NOTE — Discharge Instructions (Addendum)
You were seen in the emergency department today for chest pain. ? ?As we discussed all your lab work has looked reassuring today.  We got to troponin values to look at the demand on your heart, and both of them were normal.  I think that your symptoms could have been related to may be some anxiety or overexertion like we talked about. ? ?I like you to continue to monitor how you are doing, and return to the emergency department for any new or worsening symptoms.  I would like you to follow-up with your cardiologist as well. ?

## 2021-05-09 NOTE — ED Notes (Signed)
Pt verbalizes understanding of discharge instructions. Opportunity for questions and answers were provided. Pt discharged from the ED.   ?

## 2021-05-09 NOTE — ED Triage Notes (Signed)
Pt arrives POV for eval of centralized chest pain that he noted on waking today. Pt reports severe chest tightness and feeling of impending doom. Pt reports chest pain has since resolved since the episode, but last approx 3 hours. Reports he was out dancing last night and concerned that he may have overexerted himself. Pt reports he is chest pain free at the time, but reports that he feels as though his chest is "bruised". Reports if he presses against a certain place in his chest he feels the pain again, but not at rest ?

## 2021-05-09 NOTE — ED Provider Notes (Signed)
[place] [facility] EMERGENCY DEPARTMENT Provider Note   CSN: [id number] Arrival date & time: 05/09/21  1144     History  Chief Complaint  Patient presents with   Chest Pain    Elijah Knapp is a 66 y.o. male who presents the emergency department with chest pain.  Patient states that he woke up this morning he woke up and had severe chest tightness and a feeling of impending doom that lasted about 2 hours before resolving on its own.  He states that he went out dancing last night, and is concerned that he overexerted himself.  He feels as though his chest is "bruised", but no active chest pain now.  He also reports increased stress in his life recently.  History of a triple bypass, that is made him very health-conscious.   Chest Pain Associated symptoms: no abdominal pain, no cough, no fever, no nausea, no palpitations, no shortness of breath and no vomiting       Home Medications Prior to Admission medications   Medication Sig Start Date End Date Taking? Authorizing Provider  ALPRAZolam Duanne Moron) 0.5 MG tablet Take 1 tablet (0.5 mg total) by mouth at bedtime as needed for anxiety. Patient not taking: Reported on 11/12/2019 11/12/19   Dahlia Byes, MD  aspirin EC 81 MG tablet Take 81 mg by mouth daily. Swallow whole.    [provider]  atorvastatin (LIPITOR) 20 MG tablet Take 20 mg by mouth at bedtime.     [provider]  budesonide-formoterol (SYMBICORT) 80-4.5 MCG/ACT inhaler Inhale 2 puffs into the lungs 2 (two) times daily as needed (respiratory issues.).    [provider]  cefUROXime (CEFTIN) 250 MG tablet Take 250 mg by mouth daily as needed (allergies).     [provider]  cetirizine (ZYRTEC) 10 MG tablet Take 10 mg by mouth at bedtime as needed for allergies.    [provider]  cholecalciferol (VITAMIN D3) 25 MCG (1000 UNIT) tablet Take 1,000 Units by mouth daily.     [provider]  finasteride (PROSCAR)  5 MG tablet Take 2.5 mg by mouth daily.     [provider]  fluocinonide ointment (LIDEX) 5.27 % Apply 1 application topically 2 (two) times daily as needed (skin irritations (hands)).  07/14/19   [provider]  fluticasone (FLONASE) 50 MCG/ACT nasal spray Place 1-2 sprays into both nostrils daily as needed (runny nose/allergies.).  06/02/19   [provider]  hydrocortisone 2.5 % ointment Apply 1 application topically 2 (two) times daily as needed (skin irritations (hands)).  07/16/19   [provider]  losartan (COZAAR) 100 MG tablet Take 50 mg by mouth daily.  05/06/19   [provider]  metoprolol tartrate (LOPRESSOR) 50 MG tablet Take 25 mg by mouth 2 (two) times daily.     [provider]  omeprazole (PRILOSEC) 20 MG capsule Take 20 mg by mouth daily before breakfast.  05/24/17   Lorretta Harp, MD  sildenafil (VIAGRA) 100 MG tablet Take 100 mg by mouth daily as needed for erectile dysfunction.  09/19/18   [provider]  sodium chloride (OCEAN) 0.65 % SOLN nasal spray Place 1 spray into both nostrils 4 (four) times daily as needed for congestion.    [provider]      Allergies    Patient has no known allergies.    Review of Systems   Review of Systems  Constitutional:  Negative for chills and fever.  Respiratory:  Negative for cough and shortness of breath.   Cardiovascular:  Positive for chest pain. Negative for palpitations and leg swelling.  Gastrointestinal:  Negative for abdominal pain, nausea and vomiting.  All other systems reviewed and are negative.  Physical Exam Updated Vital Signs BP 138/68    Pulse 60    Temp 98 F (36.7 C) (Oral)    Resp 18    Ht '5\' 7"'$  (1.702 m)    Wt 75 kg    SpO2 96%    BMI 25.90 kg/m  Physical Exam Vitals and nursing note reviewed.  Constitutional:      Appearance: Normal appearance.  HENT:     Head: Normocephalic and atraumatic.  Eyes:     Conjunctiva/sclera:  Conjunctivae normal.  Cardiovascular:     Rate and Rhythm: Normal rate and regular rhythm.     Pulses:          Posterior tibial pulses are 2+ on the right side and 2+ on the left side.  Pulmonary:     Effort: Pulmonary effort is normal. No respiratory distress.     Breath sounds: Normal breath sounds.  Abdominal:     General: There is no distension.     Palpations: Abdomen is soft.     Tenderness: There is no abdominal tenderness.  Musculoskeletal:     Right lower leg: No edema.     Left lower leg: No edema.  Skin:    General: Skin is warm and dry.  Neurological:     General: No focal deficit present.     Mental Status: He is alert.    ED Results / Procedures / Treatments   Labs (all labs ordered are listed, but only abnormal results are displayed) Labs Reviewed  BASIC METABOLIC PANEL - Abnormal; Notable for the following components:      Result Value   Sodium 132 (*)    Chloride 97 (*)    Glucose, Bld 173 (*)    All other components within normal limits  CBC - Abnormal; Notable for the following components:   RBC 3.97 (*)    HCT 37.8 (*)    All other components within normal limits  TROPONIN I (HIGH SENSITIVITY)  TROPONIN I (HIGH SENSITIVITY)    EKG EKG Interpretation  Date/Time:  Monday May 09 2021 11:42:52 EST Ventricular Rate:  92 PR Interval:  164 QRS Duration: 82 QT Interval:  354 QTC Calculation: 437 R Axis:   76 Text Interpretation: Normal sinus rhythm no acute ST/T changes rate is faster, but otherwise similar when compared to 2021 Confirmed by Sherwood Gambler [phone]) on 05/09/2021 12:56:25 PM  Radiology DG Chest 2 View  Result Date: 05/09/2021 CLINICAL DATA:  Chest pain EXAM: CHEST - 2 VIEW COMPARISON:  Previous studies including the examination of 11/18/2019 FINDINGS: Cardiac size is within normal limits. There is evidence of previous coronary bypass surgery. Lung fields are clear of any infiltrates or pulmonary edema. There is no pleural effusion or  pneumothorax. IMPRESSION: No active cardiopulmonary disease. Electronically Signed   By: Elmer Picker M.D.   On: 05/09/2021 12:34    Procedures Procedures    Medications Ordered in ED Medications - No data to display  ED Course/ Medical Decision Making/ A&P                           Medical Decision Making  This patient presents to the ED for concern of chest pain, this  involves an extensive number of treatment options, and is a complaint that carries with it a high risk of complications and morbidity. The emergent differential diagnosis prior to evaluation includes, but is not limited to,  Acute coronary syndrome, pericarditis, aortic dissection, pulmonary embolism, tension pneumothorax, and esophageal rupture. Other urgent/non-acute considerations include: chronic angina, aortic stenosis, cardiomyopathy, myocarditis, mitral valve prolapse, pulmonary hypertension, hypertrophic obstructive cardiomyopathy (HOCM), aortic insufficiency, right ventricular hypertrophy, pneumonia, pleuritis, bronchitis, pneumothorax, tumor, gastroesophageal reflux disease (GERD), esophageal spasm, Mallory-Weiss syndrome, peptic ulcer disease, biliary disease, pancreatitis, functional gastrointestinal pain, cervical or thoracic disk disease or arthritis, shoulder arthritis, costochondritis, subacromial bursitis, anxiety or panic attack, herpes zoster, breast disorders, chest wall tumors, thoracic outlet syndrome, mediastinitis.   This is not an exhaustive differential.   Past Medical History / Co-morbidities / Social History: HTN, CAD, COPD, asthma, GERD  Physical Exam: Physical exam performed. The pertinent findings include: She is afebrile, not tachycardic, and in no acute distress.  Good oxygen saturation and lung sounds are clear bilaterally.  Abdomen is soft, nontender.  Good distal pulses and no leg pain or swelling.  Lab Tests: I ordered, and personally interpreted labs.  The pertinent results  include: No leukocytosis, hemoglobin normal, mild hyponatremia of 132, glucose of 173.  Initial troponin 7, delta troponin 8.   Imaging Studies: I ordered imaging studies including chest x-ray. I independently visualized and interpreted imaging which showed no acute cardiopulmonary abnormalities.. I agree with the radiologist interpretation.   Cardiac Monitoring:  The patient was maintained on a cardiac monitor.  My attending physician Dr. Regenia Skeeter viewed and interpreted the cardiac monitored which showed an underlying rhythm of: normal sinus rhythm. I agree with this interpretation.    Disposition: Patient is to be discharged with recommendation to follow up with PCP in regards to today's hospital visit. Chest pain is not likely of cardiac or pulmonary etiology d/t presentation, Well's criteria low risk for PE, VSS, no tracheal deviation, no JVD or new murmur, RRR, breath sounds equal bilaterally, EKG without acute abnormalities, negative troponin, and negative CXR. Pt has been advised to return to the ED if CP becomes exertional, associated with diaphoresis or nausea, radiates to left jaw/arm, worsens or becomes concerning in any way. Pt appears reliable for follow up and is agreeable to discharge.   Case has been discussed with and seen by Dr. Regenia Skeeter who agrees with the above plan to discharge.   Final Clinical Impression(s) / ED Diagnoses Final diagnoses:  Chest pain, unspecified type    Rx / DC Orders ED Discharge Orders     None      Portions of this report may have been transcribed using voice recognition software. Every effort was made to ensure accuracy; however, inadvertent computerized transcription errors may be present.    Estill Cotta 05/09/21 1628    Sherwood Gambler, MD 05/11/21 [phone]

## 2022-04-18 ENCOUNTER — Other Ambulatory Visit: Payer: Self-pay

## 2022-04-18 ENCOUNTER — Ambulatory Visit: Payer: No Typology Code available for payment source | Attending: Family Medicine | Admitting: Occupational Therapy

## 2022-04-18 DIAGNOSIS — M6281 Muscle weakness (generalized): Secondary | ICD-10-CM | POA: Diagnosis present

## 2022-04-18 DIAGNOSIS — M25541 Pain in joints of right hand: Secondary | ICD-10-CM | POA: Diagnosis present

## 2022-04-18 NOTE — Therapy (Unsigned)
OUTPATIENT OCCUPATIONAL THERAPY NEURO EVALUATION  Patient Name: Elijah Knapp MRN: [id number] DOB:1956-02-03, 67 y.o., male Today's Date: 04/18/2022  PCP: Rosezena Sensor, DO REFERRING PROVIDER: Rosezena Sensor, DO  END OF SESSION:  OT End of Session - 04/18/22 0920     Visit Number 1    Number of Visits 13    Date for OT Re-Evaluation 06/02/22    Authorization Type [facility]    Authorization - Number of Visits 15    OT Start Time 0802    OT Stop Time 0845    OT Time Calculation (min) 43 min             Past Medical History:  Diagnosis Date   Arthritis    Asthma    Cancer ([place])    basal cell skin ca   Chronic lung disease    Complication of anesthesia    states he "stopped breathing" during knee surgery.    COPD (chronic obstructive pulmonary disease) (HCC)    Coronary artery disease    GERD (gastroesophageal reflux disease)    Hypertension    Leg cramps    PTSD (post-traumatic stress disorder)    Sleep apnea    uses cpap   Past Surgical History:  Procedure Laterality Date   CARDIAC CATHETERIZATION Left 05/28/2017   CORONARY ARTERY BYPASS GRAFT N/A 06/04/2017   Procedure: CORONARY ARTERY BYPASS GRAFTING (CABG) x 3 WITH ENDOSCOPIC HARVESTING OF RIGHT SAPHENOUS VEIN;  Surgeon: Ivin Poot, MD;  Location: [facility];  Service: Open Heart Surgery;  Laterality: N/A;   KNEE ARTHROSCOPY W/ MENISCAL REPAIR Right    KNEE SURGERY Left    LEFT HEART CATH AND CORONARY ANGIOGRAPHY N/A 05/28/2017   Procedure: LEFT HEART CATH AND CORONARY ANGIOGRAPHY;  Surgeon: Lorretta Harp, MD;  Location: [facility];  Service: Cardiovascular;  Laterality: N/A;   NASAL SEPTOPLASTY W/ TURBINOPLASTY Bilateral 10/17/2019   Procedure: NASAL SEPTOPLASTY WITH TURBINATE REDUCTION;  Surgeon: [name], MD;  Location: [facility];  Service: ENT;  Laterality: Bilateral;  NASAL SEPTOPLASTY WITH TURBINATE REDUCTION   STERNAL WIRES REMOVAL N/A 11/20/2019    Procedure: STERNAL WIRES REMOVAL;  Surgeon: Ivin Poot, MD;  Location: [facility];  Service: Thoracic;  Laterality: N/A;   TEE WITHOUT CARDIOVERSION N/A 06/04/2017   Procedure: TRANSESOPHAGEAL ECHOCARDIOGRAM (TEE);  Surgeon: Prescott Gum, Collier Salina, MD;  Location: [facility];  Service: Open Heart Surgery;  Laterality: N/A;   Patient Active Problem List   Diagnosis Date Noted   Visit for suture removal 12/03/2019   Postop check 11/12/2019   Deviated septum 10/17/2019   Protruding sternal wires 10/15/2019   Preoperative evaluation to rule out surgical contraindication 10/08/2019   Hx of CABG 06/26/2017   Dyslipidemia, goal LDL below 70 06/26/2017   Anxiety 06/26/2017   Essential hypertension 06/26/2017   Coronary artery disease 06/04/2017   Chest pain 05/23/2017    ONSET DATE: 04/06/22  REFERRING DIAG: [id number] (ICD-10-CM) - Pain in unspecified hand  THERAPY DIAG:  Muscle weakness (generalized)  Pain in joint of right hand  Rationale for Evaluation and Treatment: Rehabilitation  SUBJECTIVE:   SUBJECTIVE STATEMENT: Pt reports that he has been having pain R hand, particularly in index knuckle for a few years.  Pt reports that he had a similar issue in his ankle and went through therapy that alleviated the pain.  Pt reports increased pain when he squeezes things and picking things up.  Pt reports Rheumatoid issue, reports the  medication has helped a lot, but is hoping to receive some ultrasound therapy to attempt to alleviate the rest of the pain.   Pt accompanied by: self  PERTINENT HISTORY: HTN, CAD, COPD, asthma, GERD   PRECAUTIONS: None  WEIGHT BEARING RESTRICTIONS: No  PAIN:  Are you having pain? Yes: NPRS scale: 5-10 with use, 0-1 at rest/10 Pain location: Index MCP  Pain description: sharp, throbbing Aggravating factors: movement, squeezing Relieving factors: medication  FALLS: Has patient fallen in last 6 months? No  LIVING ENVIRONMENT: Lives with: lives alone Lives in:  House/apartment Stairs: Yes: Internal: steps down to the basement steps; can reach both and External: 3-4 steps; none Has following equipment at home: Single point cane  PLOF: Independent  PATIENT GOALS: to have less pain in R hand  OBJECTIVE:   HAND DOMINANCE: Right  ADLs: Overall ADLs: Mod I - Independent with all ADLs.  Occasional onset of pain in R hand with managing clothing fasteners and tying shoes  IADLs: Light housekeeping: Mod I- Independent Meal Prep: Mod I - Independent Community mobility: currently driving Medication management: Independent Financial management: Independent Handwriting: 100% legible and Increased time  MOBILITY STATUS: Independent  POSTURE COMMENTS:  No Significant postural limitations  ACTIVITY TOLERANCE: Activity tolerance: WFL for tasks assessed on eval  FUNCTIONAL OUTCOME MEASURES: Physical performance test: #1 (hand writing):23.32* Quick Dash: 15.95%  UPPER EXTREMITY ROM:  WFL bilaterally  UPPER EXTREMITY MMT:   Grossly 4/5 overall bilaterally  HAND FUNCTION: Grip strength: Right: 58 lbs; Left: 60 lbs  COORDINATION: 9 Hole Peg test: Right: 40.19 sec; Left: 31.06 sec (pt reports increased pain with repetitive movements)  SENSATION: WFL  EDEMA: mild inflammation in index and long finger MCP on R hand  COGNITION: Overall cognitive status: Within functional limits for tasks assessed  VISION: Subjective report: "its good" Baseline vision: Wears glasses for reading only   TODAY'S TREATMENT:                                                       04/18/22 Modalities: OT completed ice massage in clockwise, counterclockwise, and vertical strokes while educating on 4 levels of cold (CBAN).  Provided with handout. Discussed modifications of positioning to decrease repetitive strain on R hand and encouraged pt to assess typical, routine tasks and how he can modify grip.  PATIENT EDUCATION: Education details: Educated on role and  purpose of OT as well as potential interventions and goals for therapy based on initial evaluation findings. Person educated: Patient Education method: Theatre stage manager Education comprehension: verbalized understanding  HOME EXERCISE PROGRAM: TBD   GOALS: Goals reviewed with patient? No  SHORT TERM GOALS: Target date: 05/12/22  Pt will be independent in gentle PROM/AROM exercises to decrease inflammation/pain. Baseline: Goal status: INITIAL  2.  Pt will verbalize understanding of use of modalities to decrease inflammation/pain. Baseline:  Goal status: INITIAL  3.  Pt will verbalize understanding of task modifications and/or potential AE needs to increase ease, safety, and independence with IADLS. Baseline:  Goal status: INITIAL   LONG TERM GOALS: Target date: 06/02/22  Pt will demonstrate improved fine motor coordination for ADLs as evidenced by decreasing 9 hole peg test score for RUE by 3 secs Baseline: 9 Hole Peg test: Right: 40.19 sec; Left: 31.06 sec (pt reports increased pain with repetitive movements) Goal  status: INITIAL  2.  Pt will report decreased pain in R hand/index finger during activities requiring grip to less than 5 on pain scale. Baseline:  Goal status: INITIAL  3.  Pt will report improved functional use of RUE as evidenced by improved score on Quick DASH to 10% impairment or less. Baseline: 15.9% Goal status: INITIAL   ASSESSMENT:  CLINICAL IMPRESSION: Patient is a 67 y.o. male who was seen today for occupational therapy evaluation for R hand pain. Pt with inflammation in R index MCP joint and onset of excruciating pain with activities requires squeezing and sustained grasp against resistance. Pt will benefit from skilled occupational therapy services to address strength and coordination, pain management, GM/FM control, introduction of compensatory strategies/AE prn, and implementation of an HEP to improve participation and safety during ADLs and  IADLs.   PERFORMANCE DEFICITS: in functional skills including ADLs, IADLs, coordination, edema, pain, Fine motor control, decreased knowledge of precautions, decreased knowledge of use of DME, and UE functional use and psychosocial skills including environmental adaptation and habits.   IMPAIRMENTS: are limiting patient from ADLs and IADLs.   CO-MORBIDITIES: may have co-morbidities  that affects occupational performance. Patient will benefit from skilled OT to address above impairments and improve overall function.  MODIFICATION OR ASSISTANCE TO COMPLETE EVALUATION: No modification of tasks or assist necessary to complete an evaluation.  OT OCCUPATIONAL PROFILE AND HISTORY: Problem focused assessment: Including review of records relating to presenting problem.  CLINICAL DECISION MAKING: LOW - limited treatment options, no task modification necessary  REHAB POTENTIAL: Good  EVALUATION COMPLEXITY: Low    PLAN:  OT FREQUENCY: 1-2x/week  OT DURATION: 6 weeks  PLANNED INTERVENTIONS: self care/ADL training, therapeutic exercise, therapeutic activity, neuromuscular re-education, manual therapy, passive range of motion, splinting, ultrasound, paraffin, compression bandaging, moist heat, cryotherapy, patient/family education, and DME and/or AE instructions  RECOMMENDED OTHER SERVICES: NA  CONSULTED AND AGREED WITH PLAN OF CARE: Patient  PLAN FOR NEXT SESSION: Initiate ultrasound, review modifications and/or AE to decrease strain on joints.   Simonne Come, OTR/L 04/18/2022, 10:18 AM

## 2022-04-24 ENCOUNTER — Ambulatory Visit: Payer: No Typology Code available for payment source | Admitting: Occupational Therapy

## 2022-04-24 DIAGNOSIS — M25541 Pain in joints of right hand: Secondary | ICD-10-CM

## 2022-04-24 DIAGNOSIS — M6281 Muscle weakness (generalized): Secondary | ICD-10-CM

## 2022-04-24 NOTE — Therapy (Signed)
OUTPATIENT OCCUPATIONAL THERAPY  Treatment Note  Patient Name: Elijah Knapp MRN: [id number] DOB:07-15-1955, 67 y.o., male Today's Date: 04/24/2022  PCP: Rosezena Sensor, DO REFERRING PROVIDER: Rosezena Sensor, DO  END OF SESSION:  OT End of Session - 04/24/22 0943     Visit Number 2    Number of Visits 13    Date for OT Re-Evaluation 06/02/22    Authorization Type [facility]    Authorization - Number of Visits 15    OT Start Time [phone]    OT Stop Time 1014    OT Time Calculation (min) 40 min              Past Medical History:  Diagnosis Date   Arthritis    Asthma    Cancer ([place])    basal cell skin ca   Chronic lung disease    Complication of anesthesia    states he "stopped breathing" during knee surgery.    COPD (chronic obstructive pulmonary disease) (HCC)    Coronary artery disease    GERD (gastroesophageal reflux disease)    Hypertension    Leg cramps    PTSD (post-traumatic stress disorder)    Sleep apnea    uses cpap   Past Surgical History:  Procedure Laterality Date   CARDIAC CATHETERIZATION Left 05/28/2017   CORONARY ARTERY BYPASS GRAFT N/A 06/04/2017   Procedure: CORONARY ARTERY BYPASS GRAFTING (CABG) x 3 WITH ENDOSCOPIC HARVESTING OF RIGHT SAPHENOUS VEIN;  Surgeon: Ivin Poot, MD;  Location: [facility];  Service: Open Heart Surgery;  Laterality: N/A;   KNEE ARTHROSCOPY W/ MENISCAL REPAIR Right    KNEE SURGERY Left    LEFT HEART CATH AND CORONARY ANGIOGRAPHY N/A 05/28/2017   Procedure: LEFT HEART CATH AND CORONARY ANGIOGRAPHY;  Surgeon: Lorretta Harp, MD;  Location: [place];  Service: Cardiovascular;  Laterality: N/A;   NASAL SEPTOPLASTY W/ TURBINOPLASTY Bilateral 10/17/2019   Procedure: NASAL SEPTOPLASTY WITH TURBINATE REDUCTION;  Surgeon: [name], MD;  Location: [facility];  Service: ENT;  Laterality: Bilateral;  NASAL SEPTOPLASTY WITH TURBINATE REDUCTION   STERNAL WIRES REMOVAL N/A 11/20/2019    Procedure: STERNAL WIRES REMOVAL;  Surgeon: Ivin Poot, MD;  Location: [facility];  Service: Thoracic;  Laterality: N/A;   TEE WITHOUT CARDIOVERSION N/A 06/04/2017   Procedure: TRANSESOPHAGEAL ECHOCARDIOGRAM (TEE);  Surgeon: Prescott Gum, Collier Salina, MD;  Location: [facility];  Service: Open Heart Surgery;  Laterality: N/A;   Patient Active Problem List   Diagnosis Date Noted   Visit for suture removal 12/03/2019   Postop check 11/12/2019   Deviated septum 10/17/2019   Protruding sternal wires 10/15/2019   Preoperative evaluation to rule out surgical contraindication 10/08/2019   Hx of CABG 06/26/2017   Dyslipidemia, goal LDL below 70 06/26/2017   Anxiety 06/26/2017   Essential hypertension 06/26/2017   Coronary artery disease 06/04/2017   Chest pain 05/23/2017    ONSET DATE: 04/06/22  REFERRING DIAG: [id number] (ICD-10-CM) - Pain in unspecified hand  THERAPY DIAG:  Muscle weakness (generalized)  Pain in joint of right hand  Rationale for Evaluation and Treatment: Rehabilitation  SUBJECTIVE:   SUBJECTIVE STATEMENT: Pt reports increased pain in hand after therapy evaluation due to increased strain.  Pt reports he took it easy remainder of the week and the pain has lessened.   Pt accompanied by: self  PERTINENT HISTORY: HTN, CAD, COPD, asthma, GERD   PRECAUTIONS: None  WEIGHT BEARING RESTRICTIONS: No  PAIN:  Are  you having pain? Yes: NPRS scale: 0-1/10 Pain location: Index MCP  Pain description: sharp, throbbing Aggravating factors: movement, squeezing Relieving factors: medication  FALLS: Has patient fallen in last 6 months? No  LIVING ENVIRONMENT: Lives with: lives alone Lives in: House/apartment Stairs: Yes: Internal: steps down to the basement steps; can reach both and External: 3-4 steps; none Has following equipment at home: Single point cane  PLOF: Independent  PATIENT GOALS: to have less pain in R hand  OBJECTIVE:   HAND DOMINANCE: Right  ADLs: Overall ADLs: Mod I  - Independent with all ADLs.  Occasional onset of pain in R hand with managing clothing fasteners and tying shoes  IADLs: Light housekeeping: Mod I- Independent Meal Prep: Mod I - Independent Community mobility: currently driving Medication management: Independent Financial management: Independent Handwriting: 100% legible and Increased time  MOBILITY STATUS: Independent  POSTURE COMMENTS:  No Significant postural limitations  ACTIVITY TOLERANCE: Activity tolerance: WFL for tasks assessed on eval  FUNCTIONAL OUTCOME MEASURES: Physical performance test: #1 (hand writing):23.32* Quick Dash: 15.95%  UPPER EXTREMITY ROM:  WFL bilaterally  UPPER EXTREMITY MMT:   Grossly 4/5 overall bilaterally  HAND FUNCTION: Grip strength: Right: 58 lbs; Left: 60 lbs  COORDINATION: 9 Hole Peg test: Right: 40.19 sec; Left: 31.06 sec (pt reports increased pain with repetitive movements)  SENSATION: WFL  EDEMA: mild inflammation in index and long finger MCP on R hand  COGNITION: Overall cognitive status: Within functional limits for tasks assessed  VISION: Subjective report: "its good" Baseline vision: Wears glasses for reading only   TODAY'S TREATMENT:                                                       04/24/22 Engaged in Ultrasound treatment applied to R hand at/around index and long finger for 8 min to address pain relief and promote blood flow for tissue healing; pt verbally denied contraindications or precautions. No adverse reaction observed or reported after application; skin intact Parameters: 3.3 MHz, 20% duty cycle, and intensity of 0.3 W/cm2  AROM: OT instructed in digit tendon gliding, finger MP extension with blocking, and thumb opposition.  Pt reports mild "popping" sensation in index MP joint with extension from table top to full finger extension however inconsistent.  OT reviewed cessation of task if pain and to observe onset of "popping" and report at next  session.   04/18/22 Modalities: OT completed ice massage in clockwise, counterclockwise, and vertical strokes while educating on 4 levels of cold (CBAN).  Provided with handout. Discussed modifications of positioning to decrease repetitive strain on R hand and encouraged pt to assess typical, routine tasks and how he can modify grip.  PATIENT EDUCATION: Education details:  gentle AROM exercises and management for pain  Person educated: Patient Education method: Explanation and Handouts Education comprehension: verbalized understanding  HOME EXERCISE PROGRAM: Access Code: [email or web address] URL: [email or web address] Date: 04/24/2022 Prepared by: [facility]  Exercises - Seated Digit Tendon Gliding  - 1 x daily - 2 sets - 5-10 reps - Seated Finger MP Extension AROM with Blocking  - 1 x daily - 2 sets - 10 reps - Thumb Opposition  - 1 x daily - 2 sets - 10 reps   GOALS: Goals reviewed with patient? Yes  SHORT TERM GOALS: Target  date: 05/12/22  Pt will be independent in gentle PROM/AROM exercises to decrease inflammation/pain. Baseline: Goal status: IN PROGRESS  2.  Pt will verbalize understanding of use of modalities to decrease inflammation/pain. Baseline:  Goal status: IN PROGRESS  3.  Pt will verbalize understanding of task modifications and/or potential AE needs to increase ease, safety, and independence with IADLS. Baseline:  Goal status: IN PROGRESS   LONG TERM GOALS: Target date: 06/02/22  Pt will demonstrate improved fine motor coordination for ADLs as evidenced by decreasing 9 hole peg test score for RUE by 3 secs Baseline: 9 Hole Peg test: Right: 40.19 sec; Left: 31.06 sec (pt reports increased pain with repetitive movements) Goal status: IN PROGRESS  2.  Pt will report decreased pain in R hand/index finger during activities requiring grip to less than 5 on pain scale. Baseline:  Goal status: IN PROGRESS  3.  Pt will  report improved functional use of RUE as evidenced by improved score on Quick DASH to 10% impairment or less. Baseline: 15.9% Goal status: IN PROGRESS   ASSESSMENT:  CLINICAL IMPRESSION: Pt expressing concerns with "popping" sensation in index MP joint during extension from table top to full extension, however pt with no pain and inconsistency during tendon glides.  Pt tolerating ROM and reports understanding of purpose of gentle/AROM with no resistance or pressure at this time.  PERFORMANCE DEFICITS: in functional skills including ADLs, IADLs, coordination, edema, pain, Fine motor control, decreased knowledge of precautions, decreased knowledge of use of DME, and UE functional use and psychosocial skills including environmental adaptation and habits.   IMPAIRMENTS: are limiting patient from ADLs and IADLs.   CO-MORBIDITIES: may have co-morbidities  that affects occupational performance. Patient will benefit from skilled OT to address above impairments and improve overall function.  MODIFICATION OR ASSISTANCE TO COMPLETE EVALUATION: No modification of tasks or assist necessary to complete an evaluation.  OT OCCUPATIONAL PROFILE AND HISTORY: Problem focused assessment: Including review of records relating to presenting problem.  CLINICAL DECISION MAKING: LOW - limited treatment options, no task modification necessary  REHAB POTENTIAL: Good  EVALUATION COMPLEXITY: Low    PLAN:  OT FREQUENCY: 1-2x/week  OT DURATION: 6 weeks  PLANNED INTERVENTIONS: self care/ADL training, therapeutic exercise, therapeutic activity, neuromuscular re-education, manual therapy, passive range of motion, splinting, ultrasound, paraffin, compression bandaging, moist heat, cryotherapy, patient/family education, and DME and/or AE instructions  RECOMMENDED OTHER SERVICES: NA  CONSULTED AND AGREED WITH PLAN OF CARE: Patient  PLAN FOR NEXT SESSION: Review HEP, add to as appropriate, engage in ultrasound,  review modifications and/or AE to decrease strain on joints.   Simonne Come, OTR/L 04/24/2022, 10:32 AM

## 2022-05-01 ENCOUNTER — Ambulatory Visit: Payer: No Typology Code available for payment source | Admitting: Occupational Therapy

## 2022-05-01 DIAGNOSIS — M6281 Muscle weakness (generalized): Secondary | ICD-10-CM

## 2022-05-01 DIAGNOSIS — M25541 Pain in joints of right hand: Secondary | ICD-10-CM

## 2022-05-01 NOTE — Therapy (Signed)
OUTPATIENT OCCUPATIONAL THERAPY  Treatment Note  Patient Name: Elijah Knapp MRN: [id number] DOB:Apr 29, 1955, 67 y.o., male Today's Date: 05/01/2022  PCP: Rosezena Sensor, DO REFERRING PROVIDER: Rosezena Sensor, DO  END OF SESSION:  OT End of Session - 05/01/22 0942     Visit Number 3    Number of Visits 13    Date for OT Re-Evaluation 06/02/22    [facility]ization Type [facility]    Authorization - Number of Visits 15    OT Start Time [phone]    OT Stop Time 1013    OT Time Calculation (min) 40 min               Past Medical History:  Diagnosis Date   Arthritis    Asthma    Cancer ([place])    basal cell skin ca   Chronic lung disease    Complication of anesthesia    states he "stopped breathing" during knee surgery.    COPD (chronic obstructive pulmonary disease) (HCC)    Coronary artery disease    GERD (gastroesophageal reflux disease)    Hypertension    Leg cramps    PTSD (post-traumatic stress disorder)    Sleep apnea    uses cpap   Past Surgical History:  Procedure Laterality Date   CARDIAC CATHETERIZATION Left 05/28/2017   CORONARY ARTERY BYPASS GRAFT N/A 06/04/2017   Procedure: CORONARY ARTERY BYPASS GRAFTING (CABG) x 3 WITH ENDOSCOPIC HARVESTING OF RIGHT SAPHENOUS VEIN;  Surgeon: Ivin Poot, MD;  Location: [facility];  Service: Open Heart Surgery;  Laterality: N/A;   KNEE ARTHROSCOPY W/ MENISCAL REPAIR Right    KNEE SURGERY Left    LEFT HEART CATH AND CORONARY ANGIOGRAPHY N/A 05/28/2017   Procedure: LEFT HEART CATH AND CORONARY ANGIOGRAPHY;  Surgeon: Lorretta Harp, MD;  Location: [facility];  Service: Cardiovascular;  Laterality: N/A;   NASAL SEPTOPLASTY W/ TURBINOPLASTY Bilateral 10/17/2019   Procedure: NASAL SEPTOPLASTY WITH TURBINATE REDUCTION;  Surgeon: [name], MD;  Location: [facility];  Service: ENT;  Laterality: Bilateral;  NASAL SEPTOPLASTY WITH TURBINATE REDUCTION   STERNAL WIRES REMOVAL N/A 11/20/2019    Procedure: STERNAL WIRES REMOVAL;  Surgeon: Ivin Poot, MD;  Location: [facility];  Service: Thoracic;  Laterality: N/A;   TEE WITHOUT CARDIOVERSION N/A 06/04/2017   Procedure: TRANSESOPHAGEAL ECHOCARDIOGRAM (TEE);  Surgeon: Prescott Gum, Collier Salina, MD;  Location: [facility];  Service: Open Heart Surgery;  Laterality: N/A;   Patient Active Problem List   Diagnosis Date Noted   Visit for suture removal 12/03/2019   Postop check 11/12/2019   Deviated septum 10/17/2019   Protruding sternal wires 10/15/2019   Preoperative evaluation to rule out surgical contraindication 10/08/2019   Hx of CABG 06/26/2017   Dyslipidemia, goal LDL below 70 06/26/2017   Anxiety 06/26/2017   Essential hypertension 06/26/2017   Coronary artery disease 06/04/2017   Chest pain 05/23/2017    ONSET DATE: 04/06/22  REFERRING DIAG: [id number] (ICD-10-CM) - Pain in unspecified hand  THERAPY DIAG:  Muscle weakness (generalized)  Pain in joint of right hand  Rationale for Evaluation and Treatment: Rehabilitation  SUBJECTIVE:   SUBJECTIVE STATEMENT: Pt reports "I have moments where it (the hand) flares up.  As long as I don't use it, it's fine." Pt accompanied by: self  PERTINENT HISTORY: HTN, CAD, COPD, asthma, GERD   PRECAUTIONS: None  WEIGHT BEARING RESTRICTIONS: No  PAIN:  Are you having pain? Yes: NPRS scale: 0-1/10 Pain location:  Index MCP  Pain description: sharp, throbbing Aggravating factors: movement, squeezing Relieving factors: medication  FALLS: Has patient fallen in last 6 months? No  LIVING ENVIRONMENT: Lives with: lives alone Lives in: House/apartment Stairs: Yes: Internal: steps down to the basement steps; can reach both and External: 3-4 steps; none Has following equipment at home: Single point cane  PLOF: Independent  PATIENT GOALS: to have less pain in R hand  OBJECTIVE:   HAND DOMINANCE: Right  ADLs: Overall ADLs: Mod I - Independent with all ADLs.  Occasional onset of pain in R hand  with managing clothing fasteners and tying shoes  IADLs: Light housekeeping: Mod I- Independent Meal Prep: Mod I - Independent Community mobility: currently driving Medication management: Independent Financial management: Independent Handwriting: 100% legible and Increased time  MOBILITY STATUS: Independent  POSTURE COMMENTS:  No Significant postural limitations  ACTIVITY TOLERANCE: Activity tolerance: WFL for tasks assessed on eval  FUNCTIONAL OUTCOME MEASURES: Physical performance test: #1 (hand writing):23.32* Quick Dash: 15.95%  UPPER EXTREMITY ROM:  WFL bilaterally  UPPER EXTREMITY MMT:   Grossly 4/5 overall bilaterally  HAND FUNCTION: Grip strength: Right: 58 lbs; Left: 60 lbs  COORDINATION: 9 Hole Peg test: Right: 40.19 sec; Left: 31.06 sec (pt reports increased pain with repetitive movements)  SENSATION: WFL  EDEMA: mild inflammation in index and long finger MCP on R hand  COGNITION: Overall cognitive status: Within functional limits for tasks assessed  VISION: Subjective report: "its good" Baseline vision: Wears glasses for reading only   TODAY'S TREATMENT:                                                       05/01/22 AROM: Reviewed digit tendon gliding with focus on table top and finger extension, blocked MP extension from table top, and thumb opposition.  OT instructed in [facility] stabilization with focus on sustained squeeze in "C" shape, educating on functional carryover.  OT instructed in full grasp and isolated tip to tip and 3 jaw chuck pinch on squeeze ball.  Pt reports mild increase in pain with each grip/grasp, however only up to 2/10.  Educated on completing these increased resistance exercises 3x/week. Engaged in Ultrasound treatment applied to R hand at/around index and long finger for 8 min to address pain relief and promote blood flow for tissue healing; pt verbally denied contraindications or precautions. No adverse reaction observed or reported  after application; skin intact Parameters: 3.3 MHz, 20% duty cycle, and intensity of 0.3 W/cm2     04/24/22 Engaged in Ultrasound treatment applied to R hand at/around index and long finger for 8 min to address pain relief and promote blood flow for tissue healing; pt verbally denied contraindications or precautions. No adverse reaction observed or reported after application; skin intact Parameters: 3.3 MHz, 20% duty cycle, and intensity of 0.3 W/cm2  AROM: OT instructed in digit tendon gliding, finger MP extension with blocking, and thumb opposition.  Pt reports mild "popping" sensation in index MP joint with extension from table top to full finger extension however inconsistent.  OT reviewed cessation of task if pain and to observe onset of "popping" and report at next session.   04/18/22 Modalities: OT completed ice massage in clockwise, counterclockwise, and vertical strokes while educating on 4 levels of cold (CBAN).  Provided with handout. Discussed modifications of positioning  to decrease repetitive strain on R hand and encouraged pt to assess typical, routine tasks and how he can modify grip.  PATIENT EDUCATION: Education details: gentle AROM exercises and management for pain  Person educated: Patient Education method: Explanation and Handouts Education comprehension: verbalized understanding  HOME EXERCISE PROGRAM: Access Code: [email or web address] URL: [email or web address] Date: 05/01/2022 Prepared by: [facility]  Exercises - Seated Digit Tendon Gliding  - 1 x daily - 2 sets - 5-10 reps - Seated Finger MP Extension AROM with Blocking  - 1 x daily - 2 sets - 10 reps - Thumb Opposition  - 1 x daily - 2 sets - 10 reps - Thumb Strengthening Stabilization CMC  - 1 x daily - 2 sets - 10 reps - 5-10 sec hold - Full grip and finger grip  - 1 x daily - 3 x weekly - 1 sets - 10-15 reps   GOALS: Goals reviewed with patient? Yes  SHORT TERM  GOALS: Target date: 05/12/22  Pt will be independent in gentle PROM/AROM exercises to decrease inflammation/pain. Baseline: Goal status: IN PROGRESS  2.  Pt will verbalize understanding of use of modalities to decrease inflammation/pain. Baseline:  Goal status: IN PROGRESS  3.  Pt will verbalize understanding of task modifications and/or potential AE needs to increase ease, safety, and independence with IADLS. Baseline:  Goal status: IN PROGRESS   LONG TERM GOALS: Target date: 06/02/22  Pt will demonstrate improved fine motor coordination for ADLs as evidenced by decreasing 9 hole peg test score for RUE by 3 secs Baseline: 9 Hole Peg test: Right: 40.19 sec; Left: 31.06 sec (pt reports increased pain with repetitive movements) Goal status: IN PROGRESS  2.  Pt will report decreased pain in R hand/index finger during activities requiring grip to less than 5 on pain scale. Baseline:  Goal status: IN PROGRESS  3.  Pt will report improved functional use of RUE as evidenced by improved score on Quick DASH to 10% impairment or less. Baseline: 15.9% Goal status: IN PROGRESS   ASSESSMENT:  CLINICAL IMPRESSION: Pt tolerating ROM and reports understanding of purpose of gentle/AROM and tolerance to increased resistance with full grasp, tip to tip, and 3 jaw chuck with squeeze ball.  Pt reports mild increase in pain, however only up to 2/10.   PERFORMANCE DEFICITS: in functional skills including ADLs, IADLs, coordination, edema, pain, Fine motor control, decreased knowledge of precautions, decreased knowledge of use of DME, and UE functional use and psychosocial skills including environmental adaptation and habits.   IMPAIRMENTS: are limiting patient from ADLs and IADLs.   CO-MORBIDITIES: may have co-morbidities  that affects occupational performance. Patient will benefit from skilled OT to address above impairments and improve overall function.  MODIFICATION OR ASSISTANCE TO COMPLETE  EVALUATION: No modification of tasks or assist necessary to complete an evaluation.  OT OCCUPATIONAL PROFILE AND HISTORY: Problem focused assessment: Including review of records relating to presenting problem.  CLINICAL DECISION MAKING: LOW - limited treatment options, no task modification necessary  REHAB POTENTIAL: Good  EVALUATION COMPLEXITY: Low    PLAN:  OT FREQUENCY: 1-2x/week  OT DURATION: 6 weeks  PLANNED INTERVENTIONS: self care/ADL training, therapeutic exercise, therapeutic activity, neuromuscular re-education, manual therapy, passive range of motion, splinting, ultrasound, paraffin, compression bandaging, moist heat, cryotherapy, patient/family education, and DME and/or AE instructions  RECOMMENDED OTHER SERVICES: NA  CONSULTED AND AGREED WITH PLAN OF CARE: Patient  PLAN FOR NEXT SESSION: Review HEP, add to as  appropriate, engage in ultrasound, review modifications and/or AE to decrease strain on joints.  Explore alternative modalities for pain relief.   Simonne Come, OTR/L 05/01/2022, 9:42 AM

## 2022-05-04 ENCOUNTER — Ambulatory Visit: Payer: No Typology Code available for payment source | Admitting: Occupational Therapy

## 2022-05-04 DIAGNOSIS — M6281 Muscle weakness (generalized): Secondary | ICD-10-CM | POA: Diagnosis not present

## 2022-05-04 DIAGNOSIS — M25541 Pain in joints of right hand: Secondary | ICD-10-CM

## 2022-05-04 NOTE — Therapy (Signed)
OUTPATIENT OCCUPATIONAL THERAPY  Treatment Note  Patient Name: Elijah Knapp MRN: [id number] DOB:05-05-1955, 67 y.o., male Today's Date: 05/04/2022  PCP: Rosezena Sensor, DO REFERRING PROVIDER: Rosezena Sensor, DO  END OF SESSION:  OT End of Session - 05/04/22 1002     Visit Number 4    Number of Visits 13    Date for OT Re-Evaluation 06/02/22    Authorization Type [place] - Number of Visits 15    OT Start Time 0848    OT Stop Time [id number]    OT Time Calculation (min) 40 min                Past Medical History:  Diagnosis Date   Arthritis    Asthma    Cancer ([place])    basal cell skin ca   Chronic lung disease    Complication of anesthesia    states he "stopped breathing" during knee surgery.    COPD (chronic obstructive pulmonary disease) (HCC)    Coronary artery disease    GERD (gastroesophageal reflux disease)    Hypertension    Leg cramps    PTSD (post-traumatic stress disorder)    Sleep apnea    uses cpap   Past Surgical History:  Procedure Laterality Date   CARDIAC CATHETERIZATION Left 05/28/2017   CORONARY ARTERY BYPASS GRAFT N/A 06/04/2017   Procedure: CORONARY ARTERY BYPASS GRAFTING (CABG) x 3 WITH ENDOSCOPIC HARVESTING OF RIGHT SAPHENOUS VEIN;  Surgeon: Ivin Poot, MD;  Location: [facility];  Service: Open Heart Surgery;  Laterality: N/A;   KNEE ARTHROSCOPY W/ MENISCAL REPAIR Right    KNEE SURGERY Left    LEFT HEART CATH AND CORONARY ANGIOGRAPHY N/A 05/28/2017   Procedure: LEFT HEART CATH AND CORONARY ANGIOGRAPHY;  Surgeon: Lorretta Harp, MD;  Location: [place];  Service: Cardiovascular;  Laterality: N/A;   NASAL SEPTOPLASTY W/ TURBINOPLASTY Bilateral 10/17/2019   Procedure: NASAL SEPTOPLASTY WITH TURBINATE REDUCTION;  Surgeon: [name], MD;  Location: [place];  Service: ENT;  Laterality: Bilateral;  NASAL SEPTOPLASTY WITH TURBINATE REDUCTION   STERNAL WIRES REMOVAL N/A 11/20/2019    Procedure: STERNAL WIRES REMOVAL;  Surgeon: Ivin Poot, MD;  Location: [facility];  Service: Thoracic;  Laterality: N/A;   TEE WITHOUT CARDIOVERSION N/A 06/04/2017   Procedure: TRANSESOPHAGEAL ECHOCARDIOGRAM (TEE);  Surgeon: Prescott Gum, Collier Salina, MD;  Location: [place];  Service: Open Heart Surgery;  Laterality: N/A;   Patient Active Problem List   Diagnosis Date Noted   Visit for suture removal 12/03/2019   Postop check 11/12/2019   Deviated septum 10/17/2019   Protruding sternal wires 10/15/2019   Preoperative evaluation to rule out surgical contraindication 10/08/2019   Hx of CABG 06/26/2017   Dyslipidemia, goal LDL below 70 06/26/2017   Anxiety 06/26/2017   Essential hypertension 06/26/2017   Coronary artery disease 06/04/2017   Chest pain 05/23/2017    ONSET DATE: 04/06/22  REFERRING DIAG: [id number] (ICD-10-CM) - Pain in unspecified hand  THERAPY DIAG:  Muscle weakness (generalized)  Pain in joint of right hand  Rationale for Evaluation and Treatment: Rehabilitation  SUBJECTIVE:   SUBJECTIVE STATEMENT: Pt reports the pain is better today, but over the last two days things have been difficult - due to the weather.  Pt reports not doing any exercises, due to not wanting to elevate the pain any more.   Pt accompanied by: self  PERTINENT HISTORY: HTN, CAD, COPD, asthma, GERD  PRECAUTIONS: None  WEIGHT BEARING RESTRICTIONS: No  PAIN:  Are you having pain? Yes: NPRS scale: 0-1/10 Pain location: Index MCP  Pain description: sharp, throbbing Aggravating factors: movement, squeezing Relieving factors: medication  FALLS: Has patient fallen in last 6 months? No  LIVING ENVIRONMENT: Lives with: lives alone Lives in: House/apartment Stairs: Yes: Internal: steps down to the basement steps; can reach both and External: 3-4 steps; none Has following equipment at home: Single point cane  PLOF: Independent  PATIENT GOALS: to have less pain in R hand  OBJECTIVE:   HAND  DOMINANCE: Right  ADLs: Overall ADLs: Mod I - Independent with all ADLs.  Occasional onset of pain in R hand with managing clothing fasteners and tying shoes  IADLs: Light housekeeping: Mod I- Independent Meal Prep: Mod I - Independent Community mobility: currently driving Medication management: Independent Financial management: Independent Handwriting: 100% legible and Increased time  MOBILITY STATUS: Independent  POSTURE COMMENTS:  No Significant postural limitations  ACTIVITY TOLERANCE: Activity tolerance: WFL for tasks assessed on eval  FUNCTIONAL OUTCOME MEASURES: Physical performance test: #1 (hand writing):23.32* Quick Dash: 15.95%  UPPER EXTREMITY ROM:  WFL bilaterally  UPPER EXTREMITY MMT:   Grossly 4/5 overall bilaterally  HAND FUNCTION: Grip strength: Right: 58 lbs; Left: 60 lbs  COORDINATION: 9 Hole Peg test: Right: 40.19 sec; Left: 31.06 sec (pt reports increased pain with repetitive movements)  SENSATION: WFL  EDEMA: mild inflammation in index and long finger MCP on R hand  COGNITION: Overall cognitive status: Within functional limits for tasks assessed  VISION: Subjective report: "its good" Baseline vision: Wears glasses for reading only   TODAY'S TREATMENT:                                                       05/04/22 AROM: OT instructed in finger walking with focus on lateral movements of joints with abduction/adduction.  Pt completed x10 without increase in pain.  Utilized squeeze ball in R hand while engaging in full grasp, 3 jaw chuck, tip to tip pinch, claw hook into ball, pushing digits into ball with DIP AND PIP extended and MCP flexed.  Pt reports increase in pain 1-3/10, however able to complete 5-10 with each movement.  OT providing min cues faded to supervision to ensure proper positioning/technique.  Engaged in rotation of ball in finger tips, rotation of 2 golf balls in palm of hand, and rolling of hand over ball with focus on fluidity of  movements.  Pt with no reports of increase in pain with these movements.  Provided pt with handout for ball exercises. Modalities: reviewed recommendation for use of heat prior to exercise and heat or cold for pain management.   05/01/22 AROM: Reviewed digit tendon gliding with focus on table top and finger extension, blocked MP extension from table top, and thumb opposition.  OT instructed in [facility] stabilization with focus on sustained squeeze in "C" shape, educating on functional carryover.  OT instructed in full grasp and isolated tip to tip and 3 jaw chuck pinch on squeeze ball.  Pt reports mild increase in pain with each grip/grasp, however only up to 2/10.  Educated on completing these increased resistance exercises 3x/week. Engaged in Ultrasound treatment applied to R hand at/around index and long finger for 8 min to address pain relief and promote blood flow  for tissue healing; pt verbally denied contraindications or precautions. No adverse reaction observed or reported after application; skin intact Parameters: 3.3 MHz, 20% duty cycle, and intensity of 0.3 W/cm2     04/24/22 Engaged in Ultrasound treatment applied to R hand at/around index and long finger for 8 min to address pain relief and promote blood flow for tissue healing; pt verbally denied contraindications or precautions. No adverse reaction observed or reported after application; skin intact Parameters: 3.3 MHz, 20% duty cycle, and intensity of 0.3 W/cm2  AROM: OT instructed in digit tendon gliding, finger MP extension with blocking, and thumb opposition.  Pt reports mild "popping" sensation in index MP joint with extension from table top to full finger extension however inconsistent.  OT reviewed cessation of task if pain and to observe onset of "popping" and report at next session.   04/18/22 Modalities: OT completed ice massage in clockwise, counterclockwise, and vertical strokes while educating on 4 levels of cold (CBAN).   Provided with handout. Discussed modifications of positioning to decrease repetitive strain on R hand and encouraged pt to assess typical, routine tasks and how he can modify grip.  PATIENT EDUCATION: Education details: gentle AROM exercises and management for pain  Person educated: Patient Education method: Explanation and Handouts Education comprehension: verbalized understanding  HOME EXERCISE PROGRAM: Access Code: [email or web address] URL: [email or web address] Date: 05/04/2022 Prepared by: [facility]  Exercises - Seated Digit Tendon Gliding  - 1 x daily - 2 sets - 5-10 reps - Seated Finger MP Extension AROM with Blocking  - 1 x daily - 2 sets - 10 reps - Thumb Opposition  - 1 x daily - 2 sets - 10 reps - Thumb Strengthening Stabilization CMC  - 1 x daily - 2 sets - 10 reps - 5-10 sec hold - Finger walking  - 1 x daily - 2 sets - 10 reps - Full grip and finger grip  - 1 x daily - 3 x weekly - 1 sets - 10-15 reps   GOALS: Goals reviewed with patient? Yes  SHORT TERM GOALS: Target date: 05/12/22  Pt will be independent in gentle PROM/AROM exercises to decrease inflammation/pain. Baseline: Goal status: IN PROGRESS  2.  Pt will verbalize understanding of use of modalities to decrease inflammation/pain. Baseline:  Goal status: IN PROGRESS  3.  Pt will verbalize understanding of task modifications and/or potential AE needs to increase ease, safety, and independence with IADLS. Baseline:  Goal status: IN PROGRESS   LONG TERM GOALS: Target date: 06/02/22  Pt will demonstrate improved fine motor coordination for ADLs as evidenced by decreasing 9 hole peg test score for RUE by 3 secs Baseline: 9 Hole Peg test: Right: 40.19 sec; Left: 31.06 sec (pt reports increased pain with repetitive movements) Goal status: IN PROGRESS  2.  Pt will report decreased pain in R hand/index finger during activities requiring grip to less than 5 on pain  scale. Baseline:  Goal status: IN PROGRESS  3.  Pt will report improved functional use of RUE as evidenced by improved score on Quick DASH to 10% impairment or less. Baseline: 15.9% Goal status: IN PROGRESS   ASSESSMENT:  CLINICAL IMPRESSION: Pt tolerating ROM and reports understanding of purpose of gentle/AROM and tolerance to increased resistance with squeeze ball.  Pt reports mild increase in pain, however only up to 1-3/10. OT reiterated use of heat prior to exercises and use of heat and/or cold for pain management.  Pt asking  questions about weather impacting his pain and stiffness, OT answering within scope of practice.  PERFORMANCE DEFICITS: in functional skills including ADLs, IADLs, coordination, edema, pain, Fine motor control, decreased knowledge of precautions, decreased knowledge of use of DME, and UE functional use and psychosocial skills including environmental adaptation and habits.   IMPAIRMENTS: are limiting patient from ADLs and IADLs.   CO-MORBIDITIES: may have co-morbidities  that affects occupational performance. Patient will benefit from skilled OT to address above impairments and improve overall function.  MODIFICATION OR ASSISTANCE TO COMPLETE EVALUATION: No modification of tasks or assist necessary to complete an evaluation.  OT OCCUPATIONAL PROFILE AND HISTORY: Problem focused assessment: Including review of records relating to presenting problem.  CLINICAL DECISION MAKING: LOW - limited treatment options, no task modification necessary  REHAB POTENTIAL: Good  EVALUATION COMPLEXITY: Low    PLAN:  OT FREQUENCY: 1-2x/week  OT DURATION: 6 weeks  PLANNED INTERVENTIONS: self care/ADL training, therapeutic exercise, therapeutic activity, neuromuscular re-education, manual therapy, passive range of motion, splinting, ultrasound, paraffin, compression bandaging, moist heat, cryotherapy, patient/family education, and DME and/or AE instructions  RECOMMENDED  OTHER SERVICES: NA  CONSULTED AND AGREED WITH PLAN OF CARE: Patient  PLAN FOR NEXT SESSION: Review HEP, add to as appropriate, engage in ultrasound, review modifications and/or AE to decrease strain on joints.  Explore alternative modalities for pain relief.   Simonne Come, OTR/L 05/04/2022, 10:03 AM

## 2022-05-08 ENCOUNTER — Ambulatory Visit: Payer: No Typology Code available for payment source | Attending: Family Medicine | Admitting: Occupational Therapy

## 2022-05-08 DIAGNOSIS — M6281 Muscle weakness (generalized): Secondary | ICD-10-CM | POA: Insufficient documentation

## 2022-05-08 DIAGNOSIS — M25541 Pain in joints of right hand: Secondary | ICD-10-CM | POA: Insufficient documentation

## 2022-05-08 NOTE — Therapy (Signed)
OUTPATIENT OCCUPATIONAL THERAPY  Treatment Note  Patient Name: Elijah Knapp MRN: [id number] DOB:1955-07-11, 67 y.o., male Today's Date: 05/08/2022  PCP: Rosezena Sensor, DO REFERRING PROVIDER: Rosezena Sensor, DO  END OF SESSION:  OT End of Session - 05/08/22 1016     Visit Number 5    Number of Visits 13    Date for OT Re-Evaluation 06/02/22    Authorization Type [place] - Number of Visits 15    OT Start Time 0930    OT Stop Time 1010    OT Time Calculation (min) 40 min                 Past Medical History:  Diagnosis Date   Arthritis    Asthma    Cancer ([place])    basal cell skin ca   Chronic lung disease    Complication of anesthesia    states he "stopped breathing" during knee surgery.    COPD (chronic obstructive pulmonary disease) (HCC)    Coronary artery disease    GERD (gastroesophageal reflux disease)    Hypertension    Leg cramps    PTSD (post-traumatic stress disorder)    Sleep apnea    uses cpap   Past Surgical History:  Procedure Laterality Date   CARDIAC CATHETERIZATION Left 05/28/2017   CORONARY ARTERY BYPASS GRAFT N/A 06/04/2017   Procedure: CORONARY ARTERY BYPASS GRAFTING (CABG) x 3 WITH ENDOSCOPIC HARVESTING OF RIGHT SAPHENOUS VEIN;  Surgeon: Ivin Poot, MD;  Location: [place];  Service: Open Heart Surgery;  Laterality: N/A;   KNEE ARTHROSCOPY W/ MENISCAL REPAIR Right    KNEE SURGERY Left    LEFT HEART CATH AND CORONARY ANGIOGRAPHY N/A 05/28/2017   Procedure: LEFT HEART CATH AND CORONARY ANGIOGRAPHY;  Surgeon: Lorretta Harp, MD;  Location: [place] [facility];  Service: Cardiovascular;  Laterality: N/A;   NASAL SEPTOPLASTY W/ TURBINOPLASTY Bilateral 10/17/2019   Procedure: NASAL SEPTOPLASTY WITH TURBINATE REDUCTION;  Surgeon: [name], MD;  Location: [place];  Service: ENT;  Laterality: Bilateral;  NASAL SEPTOPLASTY WITH TURBINATE REDUCTION   STERNAL WIRES REMOVAL N/A 11/20/2019    Procedure: STERNAL WIRES REMOVAL;  Surgeon: Ivin Poot, MD;  Location: [place];  Service: Thoracic;  Laterality: N/A;   TEE WITHOUT CARDIOVERSION N/A 06/04/2017   Procedure: TRANSESOPHAGEAL ECHOCARDIOGRAM (TEE);  Surgeon: Prescott Gum, Collier Salina, MD;  Location: [place];  Service: Open Heart Surgery;  Laterality: N/A;   Patient Active Problem List   Diagnosis Date Noted   Visit for suture removal 12/03/2019   Postop check 11/12/2019   Deviated septum 10/17/2019   Protruding sternal wires 10/15/2019   Preoperative evaluation to rule out surgical contraindication 10/08/2019   Hx of CABG 06/26/2017   Dyslipidemia, goal LDL below 70 06/26/2017   Anxiety 06/26/2017   Essential hypertension 06/26/2017   Coronary artery disease 06/04/2017   Chest pain 05/23/2017    ONSET DATE: 04/06/22  REFERRING DIAG: [id number] (ICD-10-CM) - Pain in unspecified hand  THERAPY DIAG:  Muscle weakness (generalized)  Pain in joint of right hand  Rationale for Evaluation and Treatment: Rehabilitation  SUBJECTIVE:   SUBJECTIVE STATEMENT: Pt reports not much pain today, except when he was leaving the house, noticing increased discomfort when turning the knob and locking the door.   Pt accompanied by: self  PERTINENT HISTORY: HTN, CAD, COPD, asthma, GERD   PRECAUTIONS: None  WEIGHT BEARING RESTRICTIONS: No  PAIN:  Are you having  pain? Yes: NPRS scale: 0-1/10 Pain location: Index MCP  Pain description: sharp, throbbing Aggravating factors: movement, squeezing Relieving factors: medication  FALLS: Has patient fallen in last 6 months? No  LIVING ENVIRONMENT: Lives with: lives alone Lives in: House/apartment Stairs: Yes: Internal: steps down to the basement steps; can reach both and External: 3-4 steps; none Has following equipment at home: Single point cane  PLOF: Independent  PATIENT GOALS: to have less pain in R hand  OBJECTIVE:   HAND DOMINANCE: Right  ADLs: Overall ADLs: Mod I - Independent  with all ADLs.  Occasional onset of pain in R hand with managing clothing fasteners and tying shoes  IADLs: Light housekeeping: Mod I- Independent Meal Prep: Mod I - Independent Community mobility: currently driving Medication management: Independent Financial management: Independent Handwriting: 100% legible and Increased time  MOBILITY STATUS: Independent  POSTURE COMMENTS:  No Significant postural limitations  ACTIVITY TOLERANCE: Activity tolerance: WFL for tasks assessed on eval  FUNCTIONAL OUTCOME MEASURES: Physical performance test: #1 (hand writing):23.32* Quick Dash: 15.95%  UPPER EXTREMITY ROM:  WFL bilaterally  UPPER EXTREMITY MMT:   Grossly 4/5 overall bilaterally  HAND FUNCTION: Grip strength: Right: 58 lbs; Left: 60 lbs  COORDINATION: 9 Hole Peg test: Right: 40.19 sec; Left: 31.06 sec (pt reports increased pain with repetitive movements)  SENSATION: WFL  EDEMA: mild inflammation in index and long finger MCP on R hand  COGNITION: Overall cognitive status: Within functional limits for tasks assessed  VISION: Subjective report: "its good" Baseline vision: Wears glasses for reading only   TODAY'S TREATMENT:                                                       05/08/22 Heat: moist heat on R hand for 10 mins prior to engaging in gentle ROM and massage.  Pt with no adverse reaction to heat. AROM: engaged in tendon gliding with pt reporting increased pain/pressure when in claw and full fist due to increased pressure on MCP join and then reports mild "popping" sensation when extending fingers back out straight.  Pt completed x5 with good focus on technique, however ceasing activity due to mild increased discomfort.  Completed "C" shape contraction with pt reporting decreased strain with this exercise this session.  Transitioning to thumb opposition and blocking with thumb extension with no complaints of pain in MCP joint except mild strain in index finger when  opposing thumb to small finger.  Massage: gentle massage to the webspace between index and long finger for desensitization. Ultrasound: Engaged in Ultrasound treatment applied to R hand at/around index and long finger at dorsal and volar aspect for 8 min to address pain relief and promote blood flow for tissue healing; pt verbally denied contraindications or precautions. No adverse reaction observed or reported after application; skin intact Parameters: 3.3 MHz, 20% duty cycle, and intensity of 0.3 W/cm2    05/04/22 AROM: OT instructed in finger walking with focus on lateral movements of joints with abduction/adduction.  Pt completed x10 without increase in pain.  Utilized squeeze ball in R hand while engaging in full grasp, 3 jaw chuck, tip to tip pinch, claw hook into ball, pushing digits into ball with DIP AND PIP extended and MCP flexed.  Pt reports increase in pain 1-3/10, however able to complete 5-10 with each movement.  OT  providing min cues faded to supervision to ensure proper positioning/technique.  Engaged in rotation of ball in finger tips, rotation of 2 golf balls in palm of hand, and rolling of hand over ball with focus on fluidity of movements.  Pt with no reports of increase in pain with these movements.  Provided pt with handout for ball exercises. Modalities: reviewed recommendation for use of heat prior to exercise and heat or cold for pain management.   05/01/22 AROM: Reviewed digit tendon gliding with focus on table top and finger extension, blocked MP extension from table top, and thumb opposition.  OT instructed in [facility] stabilization with focus on sustained squeeze in "C" shape, educating on functional carryover.  OT instructed in full grasp and isolated tip to tip and 3 jaw chuck pinch on squeeze ball.  Pt reports mild increase in pain with each grip/grasp, however only up to 2/10.  Educated on completing these increased resistance exercises 3x/week. Engaged in Ultrasound treatment  applied to R hand at/around index and long finger for 8 min to address pain relief and promote blood flow for tissue healing; pt verbally denied contraindications or precautions. No adverse reaction observed or reported after application; skin intact Parameters: 3.3 MHz, 20% duty cycle, and intensity of 0.3 W/cm2    PATIENT EDUCATION: Education details: gentle AROM exercises and management for pain  Person educated: Patient Education method: Explanation and Handouts Education comprehension: verbalized understanding  HOME EXERCISE PROGRAM: Access Code: [email or web address] URL: [email or web address] Date: 05/04/2022 Prepared by: [facility]  Exercises - Seated Digit Tendon Gliding  - 1 x daily - 2 sets - 5-10 reps - Seated Finger MP Extension AROM with Blocking  - 1 x daily - 2 sets - 10 reps - Thumb Opposition  - 1 x daily - 2 sets - 10 reps - Thumb Strengthening Stabilization CMC  - 1 x daily - 2 sets - 10 reps - 5-10 sec hold - Finger walking  - 1 x daily - 2 sets - 10 reps - Full grip and finger grip  - 1 x daily - 3 x weekly - 1 sets - 10-15 reps   GOALS: Goals reviewed with patient? Yes  SHORT TERM GOALS: Target date: 05/12/22  Pt will be independent in gentle PROM/AROM exercises to decrease inflammation/pain. Baseline: Goal status: IN PROGRESS  2.  Pt will verbalize understanding of use of modalities to decrease inflammation/pain. Baseline:  Goal status: IN PROGRESS  3.  Pt will verbalize understanding of task modifications and/or potential AE needs to increase ease, safety, and independence with IADLS. Baseline:  Goal status: IN PROGRESS   LONG TERM GOALS: Target date: 06/02/22  Pt will demonstrate improved fine motor coordination for ADLs as evidenced by decreasing 9 hole peg test score for RUE by 3 secs Baseline: 9 Hole Peg test: Right: 40.19 sec; Left: 31.06 sec (pt reports increased pain with repetitive movements) Goal  status: IN PROGRESS  2.  Pt will report decreased pain in R hand/index finger during activities requiring grip to less than 5 on pain scale. Baseline:  Goal status: IN PROGRESS  3.  Pt will report improved functional use of RUE as evidenced by improved score on Quick DASH to 10% impairment or less. Baseline: 15.9% Goal status: IN PROGRESS   ASSESSMENT:  CLINICAL IMPRESSION: Pt tolerating ROM and reports understanding of purpose of gentle/AROM, reports attempting to complete every other day. Pt reports mild increase in pain with increased pressure and  flexion aspects of tasks, educated on completion of flexion and extension exercises in tandem. Pt tolerating ultrasound with on adverse reactions.  PERFORMANCE DEFICITS: in functional skills including ADLs, IADLs, coordination, edema, pain, Fine motor control, decreased knowledge of precautions, decreased knowledge of use of DME, and UE functional use and psychosocial skills including environmental adaptation and habits.   IMPAIRMENTS: are limiting patient from ADLs and IADLs.   CO-MORBIDITIES: may have co-morbidities  that affects occupational performance. Patient will benefit from skilled OT to address above impairments and improve overall function.  MODIFICATION OR ASSISTANCE TO COMPLETE EVALUATION: No modification of tasks or assist necessary to complete an evaluation.  OT OCCUPATIONAL PROFILE AND HISTORY: Problem focused assessment: Including review of records relating to presenting problem.  CLINICAL DECISION MAKING: LOW - limited treatment options, no task modification necessary  REHAB POTENTIAL: Good  EVALUATION COMPLEXITY: Low    PLAN:  OT FREQUENCY: 1-2x/week  OT DURATION: 6 weeks  PLANNED INTERVENTIONS: self care/ADL training, therapeutic exercise, therapeutic activity, neuromuscular re-education, manual therapy, passive range of motion, splinting, ultrasound, paraffin, compression bandaging, moist heat, cryotherapy,  patient/family education, and DME and/or AE instructions  RECOMMENDED OTHER SERVICES: NA  CONSULTED AND AGREED WITH PLAN OF CARE: Patient  PLAN FOR NEXT SESSION: Review HEP, add to as appropriate, engage in ultrasound, review modifications and/or AE to decrease strain on joints.  Explore alternative modalities for pain relief.   Simonne Come, OTR/L 05/08/2022, 10:17 AM

## 2022-05-10 ENCOUNTER — Ambulatory Visit (HOSPITAL_BASED_OUTPATIENT_CLINIC_OR_DEPARTMENT_OTHER): Payer: No Typology Code available for payment source | Admitting: Physical Therapy

## 2022-05-11 ENCOUNTER — Ambulatory Visit: Payer: No Typology Code available for payment source | Admitting: Occupational Therapy

## 2022-05-11 DIAGNOSIS — M6281 Muscle weakness (generalized): Secondary | ICD-10-CM | POA: Diagnosis not present

## 2022-05-11 DIAGNOSIS — M25541 Pain in joints of right hand: Secondary | ICD-10-CM

## 2022-05-11 NOTE — Therapy (Signed)
OUTPATIENT OCCUPATIONAL THERAPY  Treatment Note  Patient Name: Elijah Knapp MRN: [id number] DOB:Oct 17, 1955, 67 y.o., male Today's Date: 05/11/2022  PCP: Rosezena Sensor, DO REFERRING PROVIDER: Rosezena Sensor, DO  END OF SESSION:  OT End of Session - 05/11/22 1022     Visit Number 6    Number of Visits 13    Date for OT Re-Evaluation 06/02/22    Authorization Type [facility]    Authorization - Number of Visits 15    OT Start Time 1017    OT Stop Time 1057    OT Time Calculation (min) 40 min                  Past Medical History:  Diagnosis Date   Arthritis    Asthma    Cancer ([place])    basal cell skin ca   Chronic lung disease    Complication of anesthesia    states he "stopped breathing" during knee surgery.    COPD (chronic obstructive pulmonary disease) (HCC)    Coronary artery disease    GERD (gastroesophageal reflux disease)    Hypertension    Leg cramps    PTSD (post-traumatic stress disorder)    Sleep apnea    uses cpap   Past Surgical History:  Procedure Laterality Date   CARDIAC CATHETERIZATION Left 05/28/2017   CORONARY ARTERY BYPASS GRAFT N/A 06/04/2017   Procedure: CORONARY ARTERY BYPASS GRAFTING (CABG) x 3 WITH ENDOSCOPIC HARVESTING OF RIGHT SAPHENOUS VEIN;  Surgeon: Ivin Poot, MD;  Location: [place];  Service: Open Heart Surgery;  Laterality: N/A;   KNEE ARTHROSCOPY W/ MENISCAL REPAIR Right    KNEE SURGERY Left    LEFT HEART CATH AND CORONARY ANGIOGRAPHY N/A 05/28/2017   Procedure: LEFT HEART CATH AND CORONARY ANGIOGRAPHY;  Surgeon: Lorretta Harp, MD;  Location: [facility];  Service: Cardiovascular;  Laterality: N/A;   NASAL SEPTOPLASTY W/ TURBINOPLASTY Bilateral 10/17/2019   Procedure: NASAL SEPTOPLASTY WITH TURBINATE REDUCTION;  Surgeon: [name], MD;  Location: [place];  Service: ENT;  Laterality: Bilateral;  NASAL SEPTOPLASTY WITH TURBINATE REDUCTION   STERNAL WIRES REMOVAL N/A 11/20/2019    Procedure: STERNAL WIRES REMOVAL;  Surgeon: Ivin Poot, MD;  Location: [place];  Service: Thoracic;  Laterality: N/A;   TEE WITHOUT CARDIOVERSION N/A 06/04/2017   Procedure: TRANSESOPHAGEAL ECHOCARDIOGRAM (TEE);  Surgeon: Prescott Gum, Collier Salina, MD;  Location: [place];  Service: Open Heart Surgery;  Laterality: N/A;   Patient Active Problem List   Diagnosis Date Noted   Visit for suture removal 12/03/2019   Postop check 11/12/2019   Deviated septum 10/17/2019   Protruding sternal wires 10/15/2019   Preoperative evaluation to rule out surgical contraindication 10/08/2019   Hx of CABG 06/26/2017   Dyslipidemia, goal LDL below 70 06/26/2017   Anxiety 06/26/2017   Essential hypertension 06/26/2017   Coronary artery disease 06/04/2017   Chest pain 05/23/2017    ONSET DATE: 04/06/22  REFERRING DIAG: [id number] (ICD-10-CM) - Pain in unspecified hand  THERAPY DIAG:  Muscle weakness (generalized)  Pain in joint of right hand  Rationale for Evaluation and Treatment: Rehabilitation  SUBJECTIVE:   SUBJECTIVE STATEMENT: Pt reports noticing some relief in hand pain.  Pt reports that he had hurt his back lifiting fire wood and took some tylenol and aleve which did not seem to help his back too much but did notice decreased pain in hand. Pt accompanied by: self  PERTINENT HISTORY: HTN, CAD,  COPD, asthma, GERD   PRECAUTIONS: None  WEIGHT BEARING RESTRICTIONS: No  PAIN:  Are you having pain? Yes: NPRS scale: 0-1/10 Pain location: Index MCP  Pain description: sharp, throbbing Aggravating factors: movement, squeezing Relieving factors: medication  FALLS: Has patient fallen in last 6 months? No  LIVING ENVIRONMENT: Lives with: lives alone Lives in: House/apartment Stairs: Yes: Internal: steps down to the basement steps; can reach both and External: 3-4 steps; none Has following equipment at home: Single point cane  PLOF: Independent  PATIENT GOALS: to have less pain in R hand  OBJECTIVE:    HAND DOMINANCE: Right  ADLs: Overall ADLs: Mod I - Independent with all ADLs.  Occasional onset of pain in R hand with managing clothing fasteners and tying shoes  IADLs: Light housekeeping: Mod I- Independent Meal Prep: Mod I - Independent Community mobility: currently driving Medication management: Independent Financial management: Independent Handwriting: 100% legible and Increased time  MOBILITY STATUS: Independent  POSTURE COMMENTS:  No Significant postural limitations  ACTIVITY TOLERANCE: Activity tolerance: WFL for tasks assessed on eval  FUNCTIONAL OUTCOME MEASURES: Physical performance test: #1 (hand writing):23.32* Quick Dash: 15.95%  UPPER EXTREMITY ROM:  WFL bilaterally  UPPER EXTREMITY MMT:   Grossly 4/5 overall bilaterally  HAND FUNCTION: Grip strength: Right: 58 lbs; Left: 60 lbs  COORDINATION: 9 Hole Peg test: Right: 40.19 sec; Left: 31.06 sec (pt reports increased pain with repetitive movements)  SENSATION: WFL  EDEMA: mild inflammation in index and long finger MCP on R hand  COGNITION: Overall cognitive status: Within functional limits for tasks assessed  VISION: Subjective report: "its good" Baseline vision: Wears glasses for reading only   TODAY'S TREATMENT:                                                       05/11/22 Pain management: pt reports taking pain medications and "being still" both physically and mentally are strategies that work best to relieve his pain. Pt also notes that running hands under warm water has tendency to alleviate pain in the moment. Heat: moist heat on R hand for 10 mins while reviewing pain management/relief strategies and discussing modifications to grasp pattern/technique when picking up fire wood. Engaged in simulated picking up of fire wood with modifications of hand placement to place logs into forearm, nestling in crook of arm to allow for increased load along larger joint and muscle area to decrease strain  on hands/finger joints.  Pt demonstrating good carryover of modifications and reports he should be able to complete in that manner at home. Massage: engaged in retrograde massage with lotion for increased lubrication providing circular massage around and over joints as well as webspace between index and long finger for desensitization and pain management. Grooved Pegs: with RUE for increased coordination and repetitive movement within pain tolerance.[name] Pt placed pegs with one at a time and removed with in hand manipulation and translating to finger tips to place into container. Pt completed with min difficulty and 1 drops.    05/08/22 Heat: moist heat on R hand for 10 mins prior to engaging in gentle ROM and massage.  Pt with no adverse reaction to heat. AROM: engaged in tendon gliding with pt reporting increased pain/pressure when in claw and full fist due to increased pressure on MCP join and then reports mild "popping"  sensation when extending fingers back out straight.  Pt completed x5 with good focus on technique, however ceasing activity due to mild increased discomfort.  Completed "C" shape contraction with pt reporting decreased strain with this exercise this session.  Transitioning to thumb opposition and blocking with thumb extension with no complaints of pain in MCP joint except mild strain in index finger when opposing thumb to small finger.  Massage: gentle massage to the webspace between index and long finger for desensitization. Ultrasound: Engaged in Ultrasound treatment applied to R hand at/around index and long finger at dorsal and volar aspect for 8 min to address pain relief and promote blood flow for tissue healing; pt verbally denied contraindications or precautions. No adverse reaction observed or reported after application; skin intact Parameters: 3.3 MHz, 20% duty cycle, and intensity of 0.3 W/cm2    05/04/22 AROM: OT instructed in finger walking with focus on lateral movements of  joints with abduction/adduction.  Pt completed x10 without increase in pain.  Utilized squeeze ball in R hand while engaging in full grasp, 3 jaw chuck, tip to tip pinch, claw hook into ball, pushing digits into ball with DIP AND PIP extended and MCP flexed.  Pt reports increase in pain 1-3/10, however able to complete 5-10 with each movement.  OT providing min cues faded to supervision to ensure proper positioning/technique.  Engaged in rotation of ball in finger tips, rotation of 2 golf balls in palm of hand, and rolling of hand over ball with focus on fluidity of movements.  Pt with no reports of increase in pain with these movements.  Provided pt with handout for ball exercises. Modalities: reviewed recommendation for use of heat prior to exercise and heat or cold for pain management.    PATIENT EDUCATION: Education details: gentle AROM exercises and management for pain  Person educated: Patient Education method: Explanation and Handouts Education comprehension: verbalized understanding  HOME EXERCISE PROGRAM: Access Code: [email or web address] URL: [email or web address] Date: 05/04/2022 Prepared by: [facility]  Exercises - Seated Digit Tendon Gliding  - 1 x daily - 2 sets - 5-10 reps - Seated Finger MP Extension AROM with Blocking  - 1 x daily - 2 sets - 10 reps - Thumb Opposition  - 1 x daily - 2 sets - 10 reps - Thumb Strengthening Stabilization CMC  - 1 x daily - 2 sets - 10 reps - 5-10 sec hold - Finger walking  - 1 x daily - 2 sets - 10 reps - Full grip and finger grip  - 1 x daily - 3 x weekly - 1 sets - 10-15 reps   GOALS: Goals reviewed with patient? Yes  SHORT TERM GOALS: Target date: 05/12/22  Pt will be independent in gentle PROM/AROM exercises to decrease inflammation/pain. Baseline: Goal status: MET - 05/11/22  2.  Pt will verbalize understanding of use of modalities to decrease inflammation/pain. Baseline:  Goal status: MET -  05/11/22  3.  Pt will verbalize understanding of task modifications and/or potential AE needs to increase ease, safety, and independence with IADLS. Baseline:  Goal status: MET - 05/11/22   LONG TERM GOALS: Target date: 06/02/22  Pt will demonstrate improved fine motor coordination for ADLs as evidenced by decreasing 9 hole peg test score for RUE by 3 secs Baseline: 9 Hole Peg test: Right: 40.19 sec; Left: 31.06 sec (pt reports increased pain with repetitive movements) Goal status: IN PROGRESS  2.  Pt will report decreased  pain in R hand/index finger during activities requiring grip to less than 5 on pain scale. Baseline:  Goal status: IN PROGRESS  3.  Pt will report improved functional use of RUE as evidenced by improved score on Quick DASH to 10% impairment or less. Baseline: 15.9% Goal status: IN PROGRESS   ASSESSMENT:  CLINICAL IMPRESSION: Pt responding to modifications to grasp pattern and positioning when retrieving wood from wood pile to decrease strain and stress on finger and hand joints.  Pt reporting starting to notice some relief of pain with combination of therapy and medication.  PERFORMANCE DEFICITS: in functional skills including ADLs, IADLs, coordination, edema, pain, Fine motor control, decreased knowledge of precautions, decreased knowledge of use of DME, and UE functional use and psychosocial skills including environmental adaptation and habits.   IMPAIRMENTS: are limiting patient from ADLs and IADLs.   CO-MORBIDITIES: may have co-morbidities  that affects occupational performance. Patient will benefit from skilled OT to address above impairments and improve overall function.  MODIFICATION OR ASSISTANCE TO COMPLETE EVALUATION: No modification of tasks or assist necessary to complete an evaluation.  OT OCCUPATIONAL PROFILE AND HISTORY: Problem focused assessment: Including review of records relating to presenting problem.  CLINICAL DECISION MAKING: LOW - limited  treatment options, no task modification necessary  REHAB POTENTIAL: Good  EVALUATION COMPLEXITY: Low    PLAN:  OT FREQUENCY: 1-2x/week  OT DURATION: 6 weeks  PLANNED INTERVENTIONS: self care/ADL training, therapeutic exercise, therapeutic activity, neuromuscular re-education, manual therapy, passive range of motion, splinting, ultrasound, paraffin, compression bandaging, moist heat, cryotherapy, patient/family education, and DME and/or AE instructions  RECOMMENDED OTHER SERVICES: NA  CONSULTED AND AGREED WITH PLAN OF CARE: Patient  PLAN FOR NEXT SESSION: Review HEP, add to as appropriate, engage in ultrasound, review modifications and/or AE to decrease strain on joints.  Explore alternative modalities for pain relief.   Simonne Come, OTR/L 05/11/2022, 10:23 AM

## 2022-05-15 ENCOUNTER — Ambulatory Visit: Payer: No Typology Code available for payment source | Admitting: Occupational Therapy

## 2022-05-15 DIAGNOSIS — M6281 Muscle weakness (generalized): Secondary | ICD-10-CM | POA: Diagnosis not present

## 2022-05-15 DIAGNOSIS — M25541 Pain in joints of right hand: Secondary | ICD-10-CM

## 2022-05-15 NOTE — Therapy (Signed)
OUTPATIENT OCCUPATIONAL THERAPY  Treatment Note  Patient Name: XZAVIAR CANEL MRN: [id number] DOB:Aug 09, 1955, 67 y.o., male Today's Date: 05/15/2022  PCP: Rosezena Sensor, DO REFERRING PROVIDER: Rosezena Sensor, DO  END OF SESSION:  OT End of Session - 05/15/22 0938     Visit Number 7    Number of Visits 13    Date for OT Re-Evaluation 06/02/22    Authorization Type [facility]    Authorization - Number of Visits 15    OT Start Time [phone]    OT Stop Time 1015    OT Time Calculation (min) 44 min                   Past Medical History:  Diagnosis Date   Arthritis    Asthma    Cancer ([place])    basal cell skin ca   Chronic lung disease    Complication of anesthesia    states he "stopped breathing" during knee surgery.    COPD (chronic obstructive pulmonary disease) (HCC)    Coronary artery disease    GERD (gastroesophageal reflux disease)    Hypertension    Leg cramps    PTSD (post-traumatic stress disorder)    Sleep apnea    uses cpap   Past Surgical History:  Procedure Laterality Date   CARDIAC CATHETERIZATION Left 05/28/2017   CORONARY ARTERY BYPASS GRAFT N/A 06/04/2017   Procedure: CORONARY ARTERY BYPASS GRAFTING (CABG) x 3 WITH ENDOSCOPIC HARVESTING OF RIGHT SAPHENOUS VEIN;  Surgeon: Ivin Poot, MD;  Location: [place];  Service: Open Heart Surgery;  Laterality: N/A;   KNEE ARTHROSCOPY W/ MENISCAL REPAIR Right    KNEE SURGERY Left    LEFT HEART CATH AND CORONARY ANGIOGRAPHY N/A 05/28/2017   Procedure: LEFT HEART CATH AND CORONARY ANGIOGRAPHY;  Surgeon: Lorretta Harp, MD;  Location: [facility];  Service: Cardiovascular;  Laterality: N/A;   NASAL SEPTOPLASTY W/ TURBINOPLASTY Bilateral 10/17/2019   Procedure: NASAL SEPTOPLASTY WITH TURBINATE REDUCTION;  Surgeon: [name], MD;  Location: [place];  Service: ENT;  Laterality: Bilateral;  NASAL SEPTOPLASTY WITH TURBINATE REDUCTION   STERNAL WIRES REMOVAL N/A 11/20/2019    Procedure: STERNAL WIRES REMOVAL;  Surgeon: Ivin Poot, MD;  Location: [place];  Service: Thoracic;  Laterality: N/A;   TEE WITHOUT CARDIOVERSION N/A 06/04/2017   Procedure: TRANSESOPHAGEAL ECHOCARDIOGRAM (TEE);  Surgeon: Prescott Gum, Collier Salina, MD;  Location: [place];  Service: Open Heart Surgery;  Laterality: N/A;   Patient Active Problem List   Diagnosis Date Noted   Visit for suture removal 12/03/2019   Postop check 11/12/2019   Deviated septum 10/17/2019   Protruding sternal wires 10/15/2019   Preoperative evaluation to rule out surgical contraindication 10/08/2019   Hx of CABG 06/26/2017   Dyslipidemia, goal LDL below 70 06/26/2017   Anxiety 06/26/2017   Essential hypertension 06/26/2017   Coronary artery disease 06/04/2017   Chest pain 05/23/2017    ONSET DATE: 04/06/22  REFERRING DIAG: [id number] (ICD-10-CM) - Pain in unspecified hand  THERAPY DIAG:  Muscle weakness (generalized)  Pain in joint of right hand  Rationale for Evaluation and Treatment: Rehabilitation  SUBJECTIVE:   SUBJECTIVE STATEMENT: Pt reports "the pain is ebbing away".  Pt reports that as long as he picks up items with 3-5 fingers the pain is not as severe.   Pt accompanied by: self  PERTINENT HISTORY: HTN, CAD, COPD, asthma, GERD   PRECAUTIONS: None  WEIGHT BEARING RESTRICTIONS: No  PAIN:  Are you having pain? Yes: NPRS scale: 0-1/10 Pain location: Index MCP  Pain description: constant Aggravating factors: movement, squeezing Relieving factors: medication  FALLS: Has patient fallen in last 6 months? No  LIVING ENVIRONMENT: Lives with: lives alone Lives in: House/apartment Stairs: Yes: Internal: steps down to the basement steps; can reach both and External: 3-4 steps; none Has following equipment at home: Single point cane  PLOF: Independent  PATIENT GOALS: to have less pain in R hand  OBJECTIVE:   HAND DOMINANCE: Right  ADLs: Overall ADLs: Mod I - Independent with all ADLs.   Occasional onset of pain in R hand with managing clothing fasteners and tying shoes  IADLs: Light housekeeping: Mod I- Independent Meal Prep: Mod I - Independent Community mobility: currently driving Medication management: Independent Financial management: Independent Handwriting: 100% legible and Increased time  MOBILITY STATUS: Independent  POSTURE COMMENTS:  No Significant postural limitations  ACTIVITY TOLERANCE: Activity tolerance: WFL for tasks assessed on eval  FUNCTIONAL OUTCOME MEASURES: Physical performance test: #1 (hand writing):23.32* Quick Dash: 15.95% 05/15/22: Quick Dash: 27.27%  UPPER EXTREMITY ROM:  WFL bilaterally  UPPER EXTREMITY MMT:   Grossly 4/5 overall bilaterally  HAND FUNCTION: Grip strength: Right: 58 lbs; Left: 60 lbs  COORDINATION: 9 Hole Peg test: Right: 40.19 sec; Left: 31.06 sec (pt reports increased pain with repetitive movements)  SENSATION: WFL  EDEMA: mild inflammation in index and long finger MCP on R hand  COGNITION: Overall cognitive status: Within functional limits for tasks assessed  VISION: Subjective report: "its good" Baseline vision: Wears glasses for reading only   TODAY'S TREATMENT:                                                       05/15/22 Heat: moist heat on R hand for 10 mins while reviewing pain management/relief strategies and discussing modifications to grasp pattern/technique when picking up fire wood.  Pt reports that he utilized fingers 3-5 with ability to sustain grasp with no increase in pain.   Quick DASH: 27.27 - pt with higher reports with mild difficulty with many prompts, compared to no difficulty initially.  Discussed possibility of increased awareness of pain with increased functional use. Pipe tree: engaged in pattern replication with focus on functional use of RUE, incorporating pressure and manipulation of pieces, to challenge grasp strength and management of pain during functional use.  Pt reports  utilizing "mental strategies" to decrease pain, as educated on last week. Ultrasound: Engaged in Ultrasound treatment applied to R hand at/around index and long finger at dorsal and volar aspect for 7 min to address pain relief and promote blood flow for tissue healing; pt verbally denied contraindications or precautions. No adverse reaction observed or reported after application; skin intact Parameters: 3.3 MHz, 20% duty cycle, and intensity of 0.3 W/cm2  AE: discussed AE options as pt reports mild discomfort in index finger with repetitive clicking on computer mouse when completing daily crossword puzzle.    05/11/22 Pain management: pt reports taking pain medications and "being still" both physically and mentally are strategies that work best to relieve his pain. Pt also notes that running hands under warm water has tendency to alleviate pain in the moment. Heat: moist heat on R hand for 10 mins while reviewing pain management/relief strategies and discussing modifications to grasp  pattern/technique when picking up fire wood. Engaged in simulated picking up of fire wood with modifications of hand placement to place logs into forearm, nestling in crook of arm to allow for increased load along larger joint and muscle area to decrease strain on hands/finger joints.  Pt demonstrating good carryover of modifications and reports he should be able to complete in that manner at home. Massage: engaged in retrograde massage with lotion for increased lubrication providing circular massage around and over joints as well as webspace between index and long finger for desensitization and pain management. Grooved Pegs: with RUE for increased coordination and repetitive movement within pain tolerance.[name] Pt placed pegs with one at a time and removed with in hand manipulation and translating to finger tips to place into container. Pt completed with min difficulty and 1 drops.    05/08/22 Heat: moist heat on R hand for  10 mins prior to engaging in gentle ROM and massage.  Pt with no adverse reaction to heat. AROM: engaged in tendon gliding with pt reporting increased pain/pressure when in claw and full fist due to increased pressure on MCP join and then reports mild "popping" sensation when extending fingers back out straight.  Pt completed x5 with good focus on technique, however ceasing activity due to mild increased discomfort.  Completed "C" shape contraction with pt reporting decreased strain with this exercise this session.  Transitioning to thumb opposition and blocking with thumb extension with no complaints of pain in MCP joint except mild strain in index finger when opposing thumb to small finger.  Massage: gentle massage to the webspace between index and long finger for desensitization. Ultrasound: Engaged in Ultrasound treatment applied to R hand at/around index and long finger at dorsal and volar aspect for 8 min to address pain relief and promote blood flow for tissue healing; pt verbally denied contraindications or precautions. No adverse reaction observed or reported after application; skin intact Parameters: 3.3 MHz, 20% duty cycle, and intensity of 0.3 W/cm2    PATIENT EDUCATION: Education details: gentle AROM exercises and management for pain  Person educated: Patient Education method: Explanation and Handouts Education comprehension: verbalized understanding  HOME EXERCISE PROGRAM: Access Code: [email or web address] URL: [email or web address] Date: 05/04/2022 Prepared by: [facility]  Exercises - Seated Digit Tendon Gliding  - 1 x daily - 2 sets - 5-10 reps - Seated Finger MP Extension AROM with Blocking  - 1 x daily - 2 sets - 10 reps - Thumb Opposition  - 1 x daily - 2 sets - 10 reps - Thumb Strengthening Stabilization CMC  - 1 x daily - 2 sets - 10 reps - 5-10 sec hold - Finger walking  - 1 x daily - 2 sets - 10 reps - Full grip and finger grip   - 1 x daily - 3 x weekly - 1 sets - 10-15 reps   GOALS: Goals reviewed with patient? Yes  SHORT TERM GOALS: Target date: 05/12/22  Pt will be independent in gentle PROM/AROM exercises to decrease inflammation/pain. Baseline: Goal status: MET - 05/11/22  2.  Pt will verbalize understanding of use of modalities to decrease inflammation/pain. Baseline:  Goal status: MET - 05/11/22  3.  Pt will verbalize understanding of task modifications and/or potential AE needs to increase ease, safety, and independence with IADLS. Baseline:  Goal status: MET - 05/11/22   LONG TERM GOALS: Target date: 06/02/22  Pt will demonstrate improved fine motor coordination for ADLs as  evidenced by decreasing 9 hole peg test score for RUE by 3 secs Baseline: 9 Hole Peg test: Right: 40.19 sec; Left: 31.06 sec (pt reports increased pain with repetitive movements) Goal status: IN PROGRESS  2.  Pt will report decreased pain in R hand/index finger during activities requiring grip to less than 5 on pain scale. Baseline:  Goal status: IN PROGRESS  3.  Pt will report improved functional use of RUE as evidenced by improved score on Quick DASH to 10% impairment or less. Baseline: 15.9% Goal status: IN PROGRESS   ASSESSMENT:  CLINICAL IMPRESSION: Pt responding to modifications to grasp pattern and positioning when retrieving wood from wood pile to decrease strain and stress on finger and hand joints.  Pt reporting starting to notice some relief of pain with combination of therapy and medication.  Pt did report increased score on Quick Dash, however feel that could be attributed to increased awareness and focus on troubling tasks.  PERFORMANCE DEFICITS: in functional skills including ADLs, IADLs, coordination, edema, pain, Fine motor control, decreased knowledge of precautions, decreased knowledge of use of DME, and UE functional use and psychosocial skills including environmental adaptation and habits.   IMPAIRMENTS: are  limiting patient from ADLs and IADLs.   CO-MORBIDITIES: may have co-morbidities  that affects occupational performance. Patient will benefit from skilled OT to address above impairments and improve overall function.  MODIFICATION OR ASSISTANCE TO COMPLETE EVALUATION: No modification of tasks or assist necessary to complete an evaluation.  OT OCCUPATIONAL PROFILE AND HISTORY: Problem focused assessment: Including review of records relating to presenting problem.  CLINICAL DECISION MAKING: LOW - limited treatment options, no task modification necessary  REHAB POTENTIAL: Good  EVALUATION COMPLEXITY: Low    PLAN:  OT FREQUENCY: 1-2x/week  OT DURATION: 6 weeks  PLANNED INTERVENTIONS: self care/ADL training, therapeutic exercise, therapeutic activity, neuromuscular re-education, manual therapy, passive range of motion, splinting, ultrasound, paraffin, compression bandaging, moist heat, cryotherapy, patient/family education, and DME and/or AE instructions  RECOMMENDED OTHER SERVICES: NA  CONSULTED AND AGREED WITH PLAN OF CARE: Patient  PLAN FOR NEXT SESSION: Review HEP, add to as appropriate, engage in ultrasound, review modifications and/or AE to decrease strain on joints.    Simonne Come, OTR/L 05/15/2022, 9:39 AM

## 2022-05-18 ENCOUNTER — Ambulatory Visit: Payer: No Typology Code available for payment source | Admitting: Occupational Therapy

## 2022-05-18 DIAGNOSIS — M6281 Muscle weakness (generalized): Secondary | ICD-10-CM | POA: Diagnosis not present

## 2022-05-18 DIAGNOSIS — M25541 Pain in joints of right hand: Secondary | ICD-10-CM

## 2022-05-18 NOTE — Therapy (Signed)
OUTPATIENT OCCUPATIONAL THERAPY  Treatment Note  Patient Name: Elijah Knapp MRN: [id number] DOB:Jul 20, 1955, 67 y.o., male Today's Date: 05/18/2022  PCP: Rosezena Sensor, DO REFERRING PROVIDER: Rosezena Sensor, DO  END OF SESSION:  OT End of Session - 05/18/22 0938     Visit Number 8    Number of Visits 13    Date for OT Re-Evaluation 06/02/22    Authorization Type [facility]    Authorization - Number of Visits 15    OT Start Time 0935    OT Stop Time 1015    OT Time Calculation (min) 40 min                    Past Medical History:  Diagnosis Date   Arthritis    Asthma    Cancer ([place])    basal cell skin ca   Chronic lung disease    Complication of anesthesia    states he "stopped breathing" during knee surgery.    COPD (chronic obstructive pulmonary disease) (HCC)    Coronary artery disease    GERD (gastroesophageal reflux disease)    Hypertension    Leg cramps    PTSD (post-traumatic stress disorder)    Sleep apnea    uses cpap   Past Surgical History:  Procedure Laterality Date   CARDIAC CATHETERIZATION Left 05/28/2017   CORONARY ARTERY BYPASS GRAFT N/A 06/04/2017   Procedure: CORONARY ARTERY BYPASS GRAFTING (CABG) x 3 WITH ENDOSCOPIC HARVESTING OF RIGHT SAPHENOUS VEIN;  Surgeon: Ivin Poot, MD;  Location: [facility];  Service: Open Heart Surgery;  Laterality: N/A;   KNEE ARTHROSCOPY W/ MENISCAL REPAIR Right    KNEE SURGERY Left    LEFT HEART CATH AND CORONARY ANGIOGRAPHY N/A 05/28/2017   Procedure: LEFT HEART CATH AND CORONARY ANGIOGRAPHY;  Surgeon: Lorretta Harp, MD;  Location: [facility];  Service: Cardiovascular;  Laterality: N/A;   NASAL SEPTOPLASTY W/ TURBINOPLASTY Bilateral 10/17/2019   Procedure: NASAL SEPTOPLASTY WITH TURBINATE REDUCTION;  Surgeon: [name], MD;  Location: [facility];  Service: ENT;  Laterality: Bilateral;  NASAL SEPTOPLASTY WITH TURBINATE REDUCTION   STERNAL WIRES REMOVAL N/A  11/20/2019   Procedure: STERNAL WIRES REMOVAL;  Surgeon: Ivin Poot, MD;  Location: [facility];  Service: Thoracic;  Laterality: N/A;   TEE WITHOUT CARDIOVERSION N/A 06/04/2017   Procedure: TRANSESOPHAGEAL ECHOCARDIOGRAM (TEE);  Surgeon: Prescott Gum, Collier Salina, MD;  Location: [facility];  Service: Open Heart Surgery;  Laterality: N/A;   Patient Active Problem List   Diagnosis Date Noted   Visit for suture removal 12/03/2019   Postop check 11/12/2019   Deviated septum 10/17/2019   Protruding sternal wires 10/15/2019   Preoperative evaluation to rule out surgical contraindication 10/08/2019   Hx of CABG 06/26/2017   Dyslipidemia, goal LDL below 70 06/26/2017   Anxiety 06/26/2017   Essential hypertension 06/26/2017   Coronary artery disease 06/04/2017   Chest pain 05/23/2017    ONSET DATE: 04/06/22  REFERRING DIAG: [id number] (ICD-10-CM) - Pain in unspecified hand  THERAPY DIAG:  Muscle weakness (generalized)  Pain in joint of right hand  Rationale for Evaluation and Treatment: Rehabilitation  SUBJECTIVE:   SUBJECTIVE STATEMENT: Pt reports that he is going out of town for the weekend for a shag weekend in [place]. Pt accompanied by: self  PERTINENT HISTORY: HTN, CAD, COPD, asthma, GERD   PRECAUTIONS: None  WEIGHT BEARING RESTRICTIONS: No  PAIN:  Are you having pain? Yes: NPRS scale:  0-1/10 Pain location: Index MCP  Pain description: constant Aggravating factors: movement, squeezing Relieving factors: medication  FALLS: Has patient fallen in last 6 months? No  LIVING ENVIRONMENT: Lives with: lives alone Lives in: House/apartment Stairs: Yes: Internal: steps down to the basement steps; can reach both and External: 3-4 steps; none Has following equipment at home: Single point cane  PLOF: Independent  PATIENT GOALS: to have less pain in R hand  OBJECTIVE:   HAND DOMINANCE: Right  ADLs: Overall ADLs: Mod I - Independent with all ADLs.  Occasional onset of pain in R  hand with managing clothing fasteners and tying shoes  IADLs: Light housekeeping: Mod I- Independent Meal Prep: Mod I - Independent Community mobility: currently driving Medication management: Independent Financial management: Independent Handwriting: 100% legible and Increased time  MOBILITY STATUS: Independent  POSTURE COMMENTS:  No Significant postural limitations  ACTIVITY TOLERANCE: Activity tolerance: WFL for tasks assessed on eval  FUNCTIONAL OUTCOME MEASURES: Physical performance test: #1 (hand writing):23.32* Quick Dash: 15.95% 05/15/22: Quick Dash: 27.27%  UPPER EXTREMITY ROM:  WFL bilaterally  UPPER EXTREMITY MMT:   Grossly 4/5 overall bilaterally  HAND FUNCTION: Grip strength: Right: 58 lbs; Left: 60 lbs  COORDINATION: 9 Hole Peg test: Right: 40.19 sec; Left: 31.06 sec (pt reports increased pain with repetitive movements)  SENSATION: WFL  EDEMA: mild inflammation in index and long finger MCP on R hand  COGNITION: Overall cognitive status: Within functional limits for tasks assessed  VISION: Subjective report: "its good" Baseline vision: Wears glasses for reading only   TODAY'S TREATMENT:                                                       05/18/22 Heat: moist heat on R hand for 10 mins while reviewing pain management/relief strategies. Therapeutic exercise: Therapeutic exercise: reviewed Digit Tendon Gliding, Finger MP Extension AROM with Blocking, Thumb Opposition, Thumb Strengthening Stabilization CMC, Finger walking with OT providing min verbal cues and demonstration for proper technique.  OT added PROM Finger MP Extension and Wrist Radial and Ulnar Deviation AROM providing demonstration and verbal cues for technique. Ultrasound: Engaged in Ultrasound treatment applied to R hand at/around index and long finger at dorsal and palmar aspect for 8 min to address pain relief and promote blood flow for tissue healing; pt verbally denied contraindications or  precautions. No adverse reaction observed or reported after application; skin intact Parameters: 3.3 MHz, 20% duty cycle, and intensity of 0.3 W/cm2    05/15/22 Heat: moist heat on R hand for 10 mins while reviewing pain management/relief strategies and discussing modifications to grasp pattern/technique when picking up fire wood.  Pt reports that he utilized fingers 3-5 with ability to sustain grasp with no increase in pain.   Quick DASH: 27.27 - pt with higher reports with mild difficulty with many prompts, compared to no difficulty initially.  Discussed possibility of increased awareness of pain with increased functional use. Pipe tree: engaged in pattern replication with focus on functional use of RUE, incorporating pressure and manipulation of pieces, to challenge grasp strength and management of pain during functional use.  Pt reports utilizing "mental strategies" to decrease pain, as educated on last week. Ultrasound: Engaged in Ultrasound treatment applied to R hand at/around index and long finger at dorsal and volar aspect for 7 min to address  pain relief and promote blood flow for tissue healing; pt verbally denied contraindications or precautions. No adverse reaction observed or reported after application; skin intact Parameters: 3.3 MHz, 20% duty cycle, and intensity of 0.3 W/cm2  AE: discussed AE options as pt reports mild discomfort in index finger with repetitive clicking on computer mouse when completing daily crossword puzzle.    05/11/22 Pain management: pt reports taking pain medications and "being still" both physically and mentally are strategies that work best to relieve his pain. Pt also notes that running hands under warm water has tendency to alleviate pain in the moment. Heat: moist heat on R hand for 10 mins while reviewing pain management/relief strategies and discussing modifications to grasp pattern/technique when picking up fire wood. Engaged in simulated picking up of  fire wood with modifications of hand placement to place logs into forearm, nestling in crook of arm to allow for increased load along larger joint and muscle area to decrease strain on hands/finger joints.  Pt demonstrating good carryover of modifications and reports he should be able to complete in that manner at home. Massage: engaged in retrograde massage with lotion for increased lubrication providing circular massage around and over joints as well as webspace between index and long finger for desensitization and pain management. Grooved Pegs: with RUE for increased coordination and repetitive movement within pain tolerance.[name] Pt placed pegs with one at a time and removed with in hand manipulation and translating to finger tips to place into container. Pt completed with min difficulty and 1 drops.   PATIENT EDUCATION: Education details: gentle AROM exercises and management for pain  Person educated: Patient Education method: Explanation and Handouts Education comprehension: verbalized understanding  HOME EXERCISE PROGRAM: Access Code: [email or web address] URL: [email or web address] Date: 05/18/2022 Prepared by: [facility]  Exercises - Seated Digit Tendon Gliding  - 1 x daily - 2 sets - 5-10 reps - Seated Finger MP Extension AROM with Blocking  - 1 x daily - 2 sets - 10 reps - Thumb Opposition  - 1 x daily - 2 sets - 10 reps - Thumb Strengthening Stabilization CMC  - 1 x daily - 2 sets - 10 reps - 5-10 sec hold - Finger walking  - 1 x daily - 2 sets - 10 reps - Full grip and finger grip  - 1 x daily - 3 x weekly - 1 sets - 10-15 reps - Seated Finger MP Extension PROM  - 1 x daily - 1 sets - 10 reps - 10 sec hold - Seated Wrist Radial and Ulnar Deviation AROM  - 1 x daily - 1 sets - 10 reps   GOALS: Goals reviewed with patient? Yes  SHORT TERM GOALS: Target date: 05/12/22  Pt will be independent in gentle PROM/AROM exercises to decrease  inflammation/pain. Baseline: Goal status: MET - 05/11/22  2.  Pt will verbalize understanding of use of modalities to decrease inflammation/pain. Baseline:  Goal status: MET - 05/11/22  3.  Pt will verbalize understanding of task modifications and/or potential AE needs to increase ease, safety, and independence with IADLS. Baseline:  Goal status: MET - 05/11/22   LONG TERM GOALS: Target date: 06/02/22  Pt will demonstrate improved fine motor coordination for ADLs as evidenced by decreasing 9 hole peg test score for RUE by 3 secs Baseline: 9 Hole Peg test: Right: 40.19 sec; Left: 31.06 sec (pt reports increased pain with repetitive movements) Goal status: IN PROGRESS  2.  Pt will report decreased pain in R hand/index finger during activities requiring grip to less than 5 on pain scale. Baseline:  Goal status: IN PROGRESS  3.  Pt will report improved functional use of RUE as evidenced by improved score on Quick DASH to 10% impairment or less. Baseline: 15.9% Goal status: IN PROGRESS   ASSESSMENT:  CLINICAL IMPRESSION: Pt responding to modifications to grasp pattern and positioning when retrieving wood from wood pile to decrease strain and stress on finger and hand joints.  Pt reporting starting to notice some relief of pain with combination of therapy and medication.  Pt tolerating increased ROM with no reports of pain with PROM at MP or with increased pressure/repetition during tendon gliding and "C" grasp.  PERFORMANCE DEFICITS: in functional skills including ADLs, IADLs, coordination, edema, pain, Fine motor control, decreased knowledge of precautions, decreased knowledge of use of DME, and UE functional use and psychosocial skills including environmental adaptation and habits.   IMPAIRMENTS: are limiting patient from ADLs and IADLs.   CO-MORBIDITIES: may have co-morbidities  that affects occupational performance. Patient will benefit from skilled OT to address above impairments and  improve overall function.  MODIFICATION OR ASSISTANCE TO COMPLETE EVALUATION: No modification of tasks or assist necessary to complete an evaluation.  OT OCCUPATIONAL PROFILE AND HISTORY: Problem focused assessment: Including review of records relating to presenting problem.  CLINICAL DECISION MAKING: LOW - limited treatment options, no task modification necessary  REHAB POTENTIAL: Good  EVALUATION COMPLEXITY: Low    PLAN:  OT FREQUENCY: 1-2x/week  OT DURATION: 6 weeks  PLANNED INTERVENTIONS: self care/ADL training, therapeutic exercise, therapeutic activity, neuromuscular re-education, manual therapy, passive range of motion, splinting, ultrasound, paraffin, compression bandaging, moist heat, cryotherapy, patient/family education, and DME and/or AE instructions  RECOMMENDED OTHER SERVICES: NA  CONSULTED AND AGREED WITH PLAN OF CARE: Patient  PLAN FOR NEXT SESSION: Review HEP, add to as appropriate, engage in ultrasound, review modifications and/or AE to decrease strain on joints.    Simonne Come, OTR/L 05/18/2022, 9:39 AM

## 2022-05-22 ENCOUNTER — Ambulatory Visit: Payer: No Typology Code available for payment source | Admitting: Occupational Therapy

## 2022-05-22 DIAGNOSIS — M6281 Muscle weakness (generalized): Secondary | ICD-10-CM

## 2022-05-22 DIAGNOSIS — M25541 Pain in joints of right hand: Secondary | ICD-10-CM

## 2022-05-22 NOTE — Therapy (Signed)
OUTPATIENT OCCUPATIONAL THERAPY  Treatment Note  Patient Name: Elijah Knapp MRN: [id number] DOB:1955/08/09, 67 y.o., male Today's Date: 05/22/2022  PCP: Rosezena Sensor, DO REFERRING PROVIDER: Rosezena Sensor, DO  END OF SESSION:  OT End of Session - 05/22/22 1023     Visit Number 9    Number of Visits 13    Date for OT Re-Evaluation 06/02/22    Authorization Type [facility]    Authorization - Number of Visits 15    OT Start Time 1020    OT Stop Time 1100    OT Time Calculation (min) 40 min                     Past Medical History:  Diagnosis Date   Arthritis    Asthma    Cancer ([place])    basal cell skin ca   Chronic lung disease    Complication of anesthesia    states he "stopped breathing" during knee surgery.    COPD (chronic obstructive pulmonary disease) (HCC)    Coronary artery disease    GERD (gastroesophageal reflux disease)    Hypertension    Leg cramps    PTSD (post-traumatic stress disorder)    Sleep apnea    uses cpap   Past Surgical History:  Procedure Laterality Date   CARDIAC CATHETERIZATION Left 05/28/2017   CORONARY ARTERY BYPASS GRAFT N/A 06/04/2017   Procedure: CORONARY ARTERY BYPASS GRAFTING (CABG) x 3 WITH ENDOSCOPIC HARVESTING OF RIGHT SAPHENOUS VEIN;  Surgeon: Ivin Poot, MD;  Location: [facility];  Service: Open Heart Surgery;  Laterality: N/A;   KNEE ARTHROSCOPY W/ MENISCAL REPAIR Right    KNEE SURGERY Left    LEFT HEART CATH AND CORONARY ANGIOGRAPHY N/A 05/28/2017   Procedure: LEFT HEART CATH AND CORONARY ANGIOGRAPHY;  Surgeon: Lorretta Harp, MD;  Location: [facility];  Service: Cardiovascular;  Laterality: N/A;   NASAL SEPTOPLASTY W/ TURBINOPLASTY Bilateral 10/17/2019   Procedure: NASAL SEPTOPLASTY WITH TURBINATE REDUCTION;  Surgeon: [name], MD;  Location: [facility];  Service: ENT;  Laterality: Bilateral;  NASAL SEPTOPLASTY WITH TURBINATE REDUCTION   STERNAL WIRES REMOVAL N/A  11/20/2019   Procedure: STERNAL WIRES REMOVAL;  Surgeon: Ivin Poot, MD;  Location: [facility];  Service: Thoracic;  Laterality: N/A;   TEE WITHOUT CARDIOVERSION N/A 06/04/2017   Procedure: TRANSESOPHAGEAL ECHOCARDIOGRAM (TEE);  Surgeon: Prescott Gum, Collier Salina, MD;  Location: [facility];  Service: Open Heart Surgery;  Laterality: N/A;   Patient Active Problem List   Diagnosis Date Noted   Visit for suture removal 12/03/2019   Postop check 11/12/2019   Deviated septum 10/17/2019   Protruding sternal wires 10/15/2019   Preoperative evaluation to rule out surgical contraindication 10/08/2019   Hx of CABG 06/26/2017   Dyslipidemia, goal LDL below 70 06/26/2017   Anxiety 06/26/2017   Essential hypertension 06/26/2017   Coronary artery disease 06/04/2017   Chest pain 05/23/2017    ONSET DATE: 04/06/22  REFERRING DIAG: [id number] (ICD-10-CM) - Pain in unspecified hand  THERAPY DIAG:  Muscle weakness (generalized)  Pain in joint of right hand  Rationale for Evaluation and Treatment: Rehabilitation  SUBJECTIVE:   SUBJECTIVE STATEMENT: Pt reports that he is pleased with decrease in pain. Pt accompanied by: self  PERTINENT HISTORY: HTN, CAD, COPD, asthma, GERD   PRECAUTIONS: None  WEIGHT BEARING RESTRICTIONS: No  PAIN:  Are you having pain? Yes: NPRS scale: "virtually none"/10 Pain location: Index MCP  Pain  description: constant Aggravating factors: movement, squeezing Relieving factors: medication  FALLS: Has patient fallen in last 6 months? No  LIVING ENVIRONMENT: Lives with: lives alone Lives in: House/apartment Stairs: Yes: Internal: steps down to the basement steps; can reach both and External: 3-4 steps; none Has following equipment at home: Single point cane  PLOF: Independent  PATIENT GOALS: to have less pain in R hand  OBJECTIVE:   HAND DOMINANCE: Right  ADLs: Overall ADLs: Mod I - Independent with all ADLs.  Occasional onset of pain in R hand with managing clothing  fasteners and tying shoes  IADLs: Light housekeeping: Mod I- Independent Meal Prep: Mod I - Independent Community mobility: currently driving Medication management: Independent Financial management: Independent Handwriting: 100% legible and Increased time  MOBILITY STATUS: Independent  POSTURE COMMENTS:  No Significant postural limitations  ACTIVITY TOLERANCE: Activity tolerance: WFL for tasks assessed on eval  FUNCTIONAL OUTCOME MEASURES: Physical performance test: #1 (hand writing):23.32* Quick Dash: 15.95% 05/15/22: Quick Dash: 27.27%  UPPER EXTREMITY ROM:  WFL bilaterally  UPPER EXTREMITY MMT:   Grossly 4/5 overall bilaterally  HAND FUNCTION: Grip strength: Right: 58 lbs; Left: 60 lbs  COORDINATION: 9 Hole Peg test: Right: 40.19 sec; Left: 31.06 sec (pt reports increased pain with repetitive movements)  EDEMA: mild inflammation in index and long finger MCP on R hand    TODAY'S TREATMENT:                                                       05/22/22 Heat: moist heat on R hand for 10 mins while reviewing goals and current POC. Therapeutic activity: engaged in peg board pattern replication initially with large grip pegs in resistance board with focus on pinch and grip strength with repetitive movement.  OT then directed pt to stack pegs together and remove for additional pinch and pull against resistance.  Pt with no c/o pain. OT increased challenge with small peg board pattern replication. Initiated task with placing small pegs one at a time and progressing to in-hand manipulation and translation from palm to finger tips to place into peg board to challenge coordination and pain management.  Pt with no c/o pain. Ultrasound: Engaged in Ultrasound treatment applied to R hand at/around index and long finger at dorsal and palmar aspect for 8 min to address pain relief and promote blood flow for tissue healing; pt verbally denied contraindications or precautions. No adverse  reaction observed or reported after application; skin intact Parameters: 3.3 MHz, 20% duty cycle, and intensity of 0.3 W/cm2     05/18/22 Heat: moist heat on R hand for 10 mins while reviewing pain management/relief strategies. Therapeutic exercise: Therapeutic exercise: reviewed Digit Tendon Gliding, Finger MP Extension AROM with Blocking, Thumb Opposition, Thumb Strengthening Stabilization CMC, Finger walking with OT providing min verbal cues and demonstration for proper technique.  OT added PROM Finger MP Extension and Wrist Radial and Ulnar Deviation AROM providing demonstration and verbal cues for technique. Ultrasound: Engaged in Ultrasound treatment applied to R hand at/around index and long finger at dorsal and palmar aspect for 8 min to address pain relief and promote blood flow for tissue healing; pt verbally denied contraindications or precautions. No adverse reaction observed or reported after application; skin intact Parameters: 3.3 MHz, 20% duty cycle, and intensity of 0.3 W/cm2  05/15/22 Heat: moist heat on R hand for 10 mins while reviewing pain management/relief strategies and discussing modifications to grasp pattern/technique when picking up fire wood.  Pt reports that he utilized fingers 3-5 with ability to sustain grasp with no increase in pain.   Quick DASH: 27.27 - pt with higher reports with mild difficulty with many prompts, compared to no difficulty initially.  Discussed possibility of increased awareness of pain with increased functional use. Pipe tree: engaged in pattern replication with focus on functional use of RUE, incorporating pressure and manipulation of pieces, to challenge grasp strength and management of pain during functional use.  Pt reports utilizing "mental strategies" to decrease pain, as educated on last week. Ultrasound: Engaged in Ultrasound treatment applied to R hand at/around index and long finger at dorsal and volar aspect for 7 min to address pain  relief and promote blood flow for tissue healing; pt verbally denied contraindications or precautions. No adverse reaction observed or reported after application; skin intact Parameters: 3.3 MHz, 20% duty cycle, and intensity of 0.3 W/cm2  AE: discussed AE options as pt reports mild discomfort in index finger with repetitive clicking on computer mouse when completing daily crossword puzzle.    PATIENT EDUCATION: Education details: gentle AROM exercises and management for pain  Person educated: Patient Education method: Explanation and Handouts Education comprehension: verbalized understanding  HOME EXERCISE PROGRAM: Access Code: [email or web address] URL: [email or web address] Date: 05/18/2022 Prepared by: [facility]  Exercises - Seated Digit Tendon Gliding  - 1 x daily - 2 sets - 5-10 reps - Seated Finger MP Extension AROM with Blocking  - 1 x daily - 2 sets - 10 reps - Thumb Opposition  - 1 x daily - 2 sets - 10 reps - Thumb Strengthening Stabilization CMC  - 1 x daily - 2 sets - 10 reps - 5-10 sec hold - Finger walking  - 1 x daily - 2 sets - 10 reps - Full grip and finger grip  - 1 x daily - 3 x weekly - 1 sets - 10-15 reps - Seated Finger MP Extension PROM  - 1 x daily - 1 sets - 10 reps - 10 sec hold - Seated Wrist Radial and Ulnar Deviation AROM  - 1 x daily - 1 sets - 10 reps   GOALS: Goals reviewed with patient? Yes  SHORT TERM GOALS: Target date: 05/12/22  Pt will be independent in gentle PROM/AROM exercises to decrease inflammation/pain. Baseline: Goal status: MET - 05/11/22  2.  Pt will verbalize understanding of use of modalities to decrease inflammation/pain. Baseline:  Goal status: MET - 05/11/22  3.  Pt will verbalize understanding of task modifications and/or potential AE needs to increase ease, safety, and independence with IADLS. Baseline:  Goal status: MET - 05/11/22   LONG TERM GOALS: Target date: 06/02/22  Pt will  demonstrate improved fine motor coordination for ADLs as evidenced by decreasing 9 hole peg test score for RUE by 3 secs Baseline: 9 Hole Peg test: Right: 40.19 sec; Left: 31.06 sec (pt reports increased pain with repetitive movements) Goal status: IN PROGRESS  2.  Pt will report decreased pain in R hand/index finger during activities requiring grip to less than 5 on pain scale. Baseline:  Goal status: IN PROGRESS  3.  Pt will report improved functional use of RUE as evidenced by improved score on Quick DASH to 10% impairment or less. Baseline: 15.9% Goal status: IN PROGRESS  ASSESSMENT:  CLINICAL IMPRESSION: Pt tolerating increased fine motor control tasks with repetition with no onset of pain.  Pt reporting pleased with relief of pain with combination of therapy and medication.    PERFORMANCE DEFICITS: in functional skills including ADLs, IADLs, coordination, edema, pain, Fine motor control, decreased knowledge of precautions, decreased knowledge of use of DME, and UE functional use and psychosocial skills including environmental adaptation and habits.   IMPAIRMENTS: are limiting patient from ADLs and IADLs.   CO-MORBIDITIES: may have co-morbidities  that affects occupational performance. Patient will benefit from skilled OT to address above impairments and improve overall function.  MODIFICATION OR ASSISTANCE TO COMPLETE EVALUATION: No modification of tasks or assist necessary to complete an evaluation.  OT OCCUPATIONAL PROFILE AND HISTORY: Problem focused assessment: Including review of records relating to presenting problem.  CLINICAL DECISION MAKING: LOW - limited treatment options, no task modification necessary  REHAB POTENTIAL: Good  EVALUATION COMPLEXITY: Low    PLAN:  OT FREQUENCY: 1-2x/week  OT DURATION: 6 weeks  PLANNED INTERVENTIONS: self care/ADL training, therapeutic exercise, therapeutic activity, neuromuscular re-education, manual therapy, passive range of  motion, splinting, ultrasound, paraffin, compression bandaging, moist heat, cryotherapy, patient/family education, and DME and/or AE instructions  RECOMMENDED OTHER SERVICES: NA  CONSULTED AND AGREED WITH PLAN OF CARE: Patient  PLAN FOR NEXT SESSION: Review HEP, add to as appropriate, engage in ultrasound, review modifications and/or AE to decrease strain on joints.    Simonne Come, OTR/L 05/22/2022, 10:24 AM

## 2022-05-25 ENCOUNTER — Ambulatory Visit: Payer: No Typology Code available for payment source | Admitting: Occupational Therapy

## 2022-05-25 DIAGNOSIS — M6281 Muscle weakness (generalized): Secondary | ICD-10-CM

## 2022-05-25 DIAGNOSIS — M25541 Pain in joints of right hand: Secondary | ICD-10-CM

## 2022-05-25 NOTE — Therapy (Signed)
OUTPATIENT OCCUPATIONAL THERAPY  Treatment Note  Patient Name: Elijah Knapp MRN: [id number] DOB:10/27/1955, 67 y.o., male Today's Date: 05/25/2022  PCP: Rosezena Sensor, DO REFERRING PROVIDER: Rosezena Sensor, DO  END OF SESSION:  OT End of Session - 05/25/22 0948     Visit Number 10    Number of Visits 13    Date for OT Re-Evaluation 06/02/22    Authorization Type [facility]    Authorization - Number of Visits 15    OT Start Time [phone]    OT Stop Time 1017    OT Time Calculation (min) 41 min                      Past Medical History:  Diagnosis Date   Arthritis    Asthma    Cancer ([place])    basal cell skin ca   Chronic lung disease    Complication of anesthesia    states he "stopped breathing" during knee surgery.    COPD (chronic obstructive pulmonary disease) (HCC)    Coronary artery disease    GERD (gastroesophageal reflux disease)    Hypertension    Leg cramps    PTSD (post-traumatic stress disorder)    Sleep apnea    uses cpap   Past Surgical History:  Procedure Laterality Date   CARDIAC CATHETERIZATION Left 05/28/2017   CORONARY ARTERY BYPASS GRAFT N/A 06/04/2017   Procedure: CORONARY ARTERY BYPASS GRAFTING (CABG) x 3 WITH ENDOSCOPIC HARVESTING OF RIGHT SAPHENOUS VEIN;  Surgeon: Ivin Poot, MD;  Location: [facility];  Service: Open Heart Surgery;  Laterality: N/A;   KNEE ARTHROSCOPY W/ MENISCAL REPAIR Right    KNEE SURGERY Left    LEFT HEART CATH AND CORONARY ANGIOGRAPHY N/A 05/28/2017   Procedure: LEFT HEART CATH AND CORONARY ANGIOGRAPHY;  Surgeon: Lorretta Harp, MD;  Location: [facility];  Service: Cardiovascular;  Laterality: N/A;   NASAL SEPTOPLASTY W/ TURBINOPLASTY Bilateral 10/17/2019   Procedure: NASAL SEPTOPLASTY WITH TURBINATE REDUCTION;  Surgeon: [name], MD;  Location: [facility];  Service: ENT;  Laterality: Bilateral;  NASAL SEPTOPLASTY WITH TURBINATE REDUCTION   STERNAL WIRES REMOVAL N/A  11/20/2019   Procedure: STERNAL WIRES REMOVAL;  Surgeon: Ivin Poot, MD;  Location: [facility];  Service: Thoracic;  Laterality: N/A;   TEE WITHOUT CARDIOVERSION N/A 06/04/2017   Procedure: TRANSESOPHAGEAL ECHOCARDIOGRAM (TEE);  Surgeon: Prescott Gum, Collier Salina, MD;  Location: [facility];  Service: Open Heart Surgery;  Laterality: N/A;   Patient Active Problem List   Diagnosis Date Noted   Visit for suture removal 12/03/2019   Postop check 11/12/2019   Deviated septum 10/17/2019   Protruding sternal wires 10/15/2019   Preoperative evaluation to rule out surgical contraindication 10/08/2019   Hx of CABG 06/26/2017   Dyslipidemia, goal LDL below 70 06/26/2017   Anxiety 06/26/2017   Essential hypertension 06/26/2017   Coronary artery disease 06/04/2017   Chest pain 05/23/2017    ONSET DATE: 04/06/22  REFERRING DIAG: [id number] (ICD-10-CM) - Pain in unspecified hand  THERAPY DIAG:  Muscle weakness (generalized)  Pain in joint of right hand  Rationale for Evaluation and Treatment: Rehabilitation  SUBJECTIVE:   SUBJECTIVE STATEMENT: Pt reports that he noticed he used his Left hand to hold the bulk of the weight and steadying with R hand when picking up fire wood. Pt accompanied by: self  PERTINENT HISTORY: HTN, CAD, COPD, asthma, GERD   PRECAUTIONS: None  WEIGHT BEARING RESTRICTIONS: No  PAIN:  Are you having pain? Yes: NPRS scale: <1/10 Pain location: Index MCP  Pain description: constant Aggravating factors: movement, squeezing Relieving factors: medication  FALLS: Has patient fallen in last 6 months? No  LIVING ENVIRONMENT: Lives with: lives alone Lives in: House/apartment Stairs: Yes: Internal: steps down to the basement steps; can reach both and External: 3-4 steps; none Has following equipment at home: Single point cane  PLOF: Independent  PATIENT GOALS: to have less pain in R hand  OBJECTIVE:   HAND DOMINANCE: Right  ADLs: Overall ADLs: Mod I - Independent with all  ADLs.  Occasional onset of pain in R hand with managing clothing fasteners and tying shoes  IADLs: Light housekeeping: Mod I- Independent Meal Prep: Mod I - Independent Community mobility: currently driving Medication management: Independent Financial management: Independent Handwriting: 100% legible and Increased time  MOBILITY STATUS: Independent  POSTURE COMMENTS:  No Significant postural limitations  ACTIVITY TOLERANCE: Activity tolerance: WFL for tasks assessed on eval  FUNCTIONAL OUTCOME MEASURES: Physical performance test: #1 (hand writing):23.32* Quick Dash: 15.95% 05/15/22: Quick Dash: 27.27%  UPPER EXTREMITY ROM:  WFL bilaterally  UPPER EXTREMITY MMT:   Grossly 4/5 overall bilaterally  HAND FUNCTION: Grip strength: Right: 58 lbs; Left: 60 lbs  COORDINATION: 9 Hole Peg test: Right: 40.19 sec; Left: 31.06 sec (pt reports increased pain with repetitive movements)  EDEMA: mild inflammation in index and long finger MCP on R hand    TODAY'S TREATMENT:                                                       05/25/22 Heat: moist heat on R hand for 10 mins while reviewing goals and current POC.  Pt asking about joint stability to increase ease with dancing.  Educated on Recruitment consultant with ankles and completing heel raises with UE support for stability while focusing on quality of ankle movement.  Encouraged pt to reach out to PT if further lower body joint instability concerns. Theraputty: engaged in gross grasp, key pinch, duck mouth (MCP flexion), and pinch and pull with focus on tolerating increased pinch and resistance through index and long finger MCP with (tan) theraputty.  OT providing education on carryover to functional tasks. Ultrasound: Engaged in Ultrasound treatment applied to R hand at/around index and long finger at dorsal and palmar aspect for 7 min to address pain relief and promote blood flow for tissue healing; pt verbally denied contraindications or  precautions. No adverse reaction observed or reported after application; skin intact Parameters: 3.3 MHz, 20% duty cycle, and intensity of 0.3 W/cm2    05/22/22 Heat: moist heat on R hand for 10 mins while reviewing goals and current POC. Therapeutic activity: engaged in peg board pattern replication initially with large grip pegs in resistance board with focus on pinch and grip strength with repetitive movement.  OT then directed pt to stack pegs together and remove for additional pinch and pull against resistance.  Pt with no c/o pain. OT increased challenge with small peg board pattern replication. Initiated task with placing small pegs one at a time and progressing to in-hand manipulation and translation from palm to finger tips to place into peg board to challenge coordination and pain management.  Pt with no c/o pain. Ultrasound: Engaged in Ultrasound treatment applied to R hand at/around index  and long finger at dorsal and palmar aspect for 8 min to address pain relief and promote blood flow for tissue healing; pt verbally denied contraindications or precautions. No adverse reaction observed or reported after application; skin intact Parameters: 3.3 MHz, 20% duty cycle, and intensity of 0.3 W/cm2     05/18/22 Heat: moist heat on R hand for 10 mins while reviewing pain management/relief strategies. Therapeutic exercise: Therapeutic exercise: reviewed Digit Tendon Gliding, Finger MP Extension AROM with Blocking, Thumb Opposition, Thumb Strengthening Stabilization CMC, Finger walking with OT providing min verbal cues and demonstration for proper technique.  OT added PROM Finger MP Extension and Wrist Radial and Ulnar Deviation AROM providing demonstration and verbal cues for technique. Ultrasound: Engaged in Ultrasound treatment applied to R hand at/around index and long finger at dorsal and palmar aspect for 8 min to address pain relief and promote blood flow for tissue healing; pt verbally denied  contraindications or precautions. No adverse reaction observed or reported after application; skin intact Parameters: 3.3 MHz, 20% duty cycle, and intensity of 0.3 W/cm2     PATIENT EDUCATION: Education details: gentle AROM exercises and management for pain  Person educated: Patient Education method: Explanation and Handouts Education comprehension: verbalized understanding  HOME EXERCISE PROGRAM: Access Code: [email or web address] URL: [email or web address] Date: 05/25/2022 Prepared by: [facility]  Exercises - Seated Digit Tendon Gliding  - 1 x daily - 2 sets - 5-10 reps - Seated Finger MP Extension AROM with Blocking  - 1 x daily - 2 sets - 10 reps - Thumb Opposition  - 1 x daily - 2 sets - 10 reps - Thumb Strengthening Stabilization CMC  - 1 x daily - 2 sets - 10 reps - 5-10 sec hold - Finger walking  - 1 x daily - 2 sets - 10 reps - Full grip and finger grip  - 1 x daily - 3 x weekly - 1 sets - 10-15 reps - Seated Finger MP Extension PROM  - 1 x daily - 1 sets - 10 reps - 10 sec hold - Seated Wrist Radial and Ulnar Deviation AROM  - 1 x daily - 1 sets - 10 reps - Putty Squeezes  - 1 x daily - 1 sets - 10 reps - Key Pinch with Putty  - 1 x daily - 1 sets - 10 reps - "Duck mouth"  - 1 x daily - 1 sets - 10 reps - Finger Pinch and Pull with Putty  - 1 x daily - 1 sets - 10 reps   GOALS: Goals reviewed with patient? Yes  SHORT TERM GOALS: Target date: 05/12/22  Pt will be independent in gentle PROM/AROM exercises to decrease inflammation/pain. Baseline: Goal status: MET - 05/11/22  2.  Pt will verbalize understanding of use of modalities to decrease inflammation/pain. Baseline:  Goal status: MET - 05/11/22  3.  Pt will verbalize understanding of task modifications and/or potential AE needs to increase ease, safety, and independence with IADLS. Baseline:  Goal status: MET - 05/11/22   LONG TERM GOALS: Target date: 06/02/22  Pt will  demonstrate improved fine motor coordination for ADLs as evidenced by decreasing 9 hole peg test score for RUE by 3 secs Baseline: 9 Hole Peg test: Right: 40.19 sec; Left: 31.06 sec (pt reports increased pain with repetitive movements) Goal status: IN PROGRESS  2.  Pt will report decreased pain in R hand/index finger during activities requiring grip to less than 5 on  pain scale. Baseline:  Goal status: IN PROGRESS  3.  Pt will report improved functional use of RUE as evidenced by improved score on Quick DASH to 10% impairment or less. Baseline: 15.9% Goal status: IN PROGRESS   ASSESSMENT:  CLINICAL IMPRESSION: Pt tolerating increased resistance with theraputty with no onset of pain.  Pt reporting carryover of modifications of handling of fire wood with decreased pain in R hand.  Pt reporting pleased with relief of pain with combination of therapy and medication.    PERFORMANCE DEFICITS: in functional skills including ADLs, IADLs, coordination, edema, pain, Fine motor control, decreased knowledge of precautions, decreased knowledge of use of DME, and UE functional use and psychosocial skills including environmental adaptation and habits.   IMPAIRMENTS: are limiting patient from ADLs and IADLs.   CO-MORBIDITIES: may have co-morbidities  that affects occupational performance. Patient will benefit from skilled OT to address above impairments and improve overall function.  MODIFICATION OR ASSISTANCE TO COMPLETE EVALUATION: No modification of tasks or assist necessary to complete an evaluation.  OT OCCUPATIONAL PROFILE AND HISTORY: Problem focused assessment: Including review of records relating to presenting problem.  CLINICAL DECISION MAKING: LOW - limited treatment options, no task modification necessary  REHAB POTENTIAL: Good  EVALUATION COMPLEXITY: Low    PLAN:  OT FREQUENCY: 1-2x/week  OT DURATION: 6 weeks  PLANNED INTERVENTIONS: self care/ADL training, therapeutic exercise,  therapeutic activity, neuromuscular re-education, manual therapy, passive range of motion, splinting, ultrasound, paraffin, compression bandaging, moist heat, cryotherapy, patient/family education, and DME and/or AE instructions  RECOMMENDED OTHER SERVICES: NA  CONSULTED AND AGREED WITH PLAN OF CARE: Patient  PLAN FOR NEXT SESSION: Review HEP, add to as appropriate, engage in ultrasound, review modifications and/or AE to decrease strain on joints.    Simonne Come, OTR/L 05/25/2022, 10:27 AM

## 2022-05-30 ENCOUNTER — Ambulatory Visit: Payer: No Typology Code available for payment source | Admitting: Occupational Therapy

## 2022-05-30 DIAGNOSIS — M6281 Muscle weakness (generalized): Secondary | ICD-10-CM

## 2022-05-30 DIAGNOSIS — M25541 Pain in joints of right hand: Secondary | ICD-10-CM

## 2022-05-30 NOTE — Therapy (Signed)
OUTPATIENT OCCUPATIONAL THERAPY  Treatment Note  Patient Name: Elijah Knapp MRN: [id number] DOB:06-25-1955, 67 y.o., male Today's Date: 05/30/2022  PCP: Rosezena Sensor, DO REFERRING PROVIDER: Rosezena Sensor, DO  END OF SESSION:  OT End of Session - 05/30/22 0945     Visit Number 11    Number of Visits 13    Date for OT Re-Evaluation 06/02/22    Authorization Type [facility]    Authorization - Number of Visits 15    OT Start Time 0935    OT Stop Time 1015    OT Time Calculation (min) 40 min                       Past Medical History:  Diagnosis Date   Arthritis    Asthma    Cancer ([place])    basal cell skin ca   Chronic lung disease    Complication of anesthesia    states he "stopped breathing" during knee surgery.    COPD (chronic obstructive pulmonary disease) (HCC)    Coronary artery disease    GERD (gastroesophageal reflux disease)    Hypertension    Leg cramps    PTSD (post-traumatic stress disorder)    Sleep apnea    uses cpap   Past Surgical History:  Procedure Laterality Date   CARDIAC CATHETERIZATION Left 05/28/2017   CORONARY ARTERY BYPASS GRAFT N/A 06/04/2017   Procedure: CORONARY ARTERY BYPASS GRAFTING (CABG) x 3 WITH ENDOSCOPIC HARVESTING OF RIGHT SAPHENOUS VEIN;  Surgeon: Ivin Poot, MD;  Location: [facility];  Service: Open Heart Surgery;  Laterality: N/A;   KNEE ARTHROSCOPY W/ MENISCAL REPAIR Right    KNEE SURGERY Left    LEFT HEART CATH AND CORONARY ANGIOGRAPHY N/A 05/28/2017   Procedure: LEFT HEART CATH AND CORONARY ANGIOGRAPHY;  Surgeon: Lorretta Harp, MD;  Location: [facility];  Service: Cardiovascular;  Laterality: N/A;   NASAL SEPTOPLASTY W/ TURBINOPLASTY Bilateral 10/17/2019   Procedure: NASAL SEPTOPLASTY WITH TURBINATE REDUCTION;  Surgeon: [name], MD;  Location: [facility];  Service: ENT;  Laterality: Bilateral;  NASAL SEPTOPLASTY WITH TURBINATE REDUCTION   STERNAL WIRES REMOVAL N/A  11/20/2019   Procedure: STERNAL WIRES REMOVAL;  Surgeon: Ivin Poot, MD;  Location: [facility];  Service: Thoracic;  Laterality: N/A;   TEE WITHOUT CARDIOVERSION N/A 06/04/2017   Procedure: TRANSESOPHAGEAL ECHOCARDIOGRAM (TEE);  Surgeon: Prescott Gum, Collier Salina, MD;  Location: [facility];  Service: Open Heart Surgery;  Laterality: N/A;   Patient Active Problem List   Diagnosis Date Noted   Visit for suture removal 12/03/2019   Postop check 11/12/2019   Deviated septum 10/17/2019   Protruding sternal wires 10/15/2019   Preoperative evaluation to rule out surgical contraindication 10/08/2019   Hx of CABG 06/26/2017   Dyslipidemia, goal LDL below 70 06/26/2017   Anxiety 06/26/2017   Essential hypertension 06/26/2017   Coronary artery disease 06/04/2017   Chest pain 05/23/2017    ONSET DATE: 04/06/22  REFERRING DIAG: [id number] (ICD-10-CM) - Pain in unspecified hand  THERAPY DIAG:  Muscle weakness (generalized)  Pain in joint of right hand  Rationale for Evaluation and Treatment: Rehabilitation  SUBJECTIVE:   SUBJECTIVE STATEMENT: Pt reports assisting his neighbor with fixing his lawn mower with no onset of pain.  Pt reporting overall 20-25% improvement in pain. Pt accompanied by: self  PERTINENT HISTORY: HTN, CAD, COPD, asthma, GERD   PRECAUTIONS: None  WEIGHT BEARING RESTRICTIONS: No  PAIN:  Are you having pain? Yes: NPRS scale: 0 to 0.25/10 Pain location: Index MCP  Pain description: constant Aggravating factors: movement, squeezing Relieving factors: medication  FALLS: Has patient fallen in last 6 months? No  LIVING ENVIRONMENT: Lives with: lives alone Lives in: House/apartment Stairs: Yes: Internal: steps down to the basement steps; can reach both and External: 3-4 steps; none Has following equipment at home: Single point cane  PLOF: Independent  PATIENT GOALS: to have less pain in R hand  OBJECTIVE:   HAND DOMINANCE: Right  ADLs: Overall ADLs: Mod I - Independent  with all ADLs.  Occasional onset of pain in R hand with managing clothing fasteners and tying shoes  IADLs: Light housekeeping: Mod I- Independent Meal Prep: Mod I - Independent Community mobility: currently driving Medication management: Independent Financial management: Independent Handwriting: 100% legible and Increased time  MOBILITY STATUS: Independent  POSTURE COMMENTS:  No Significant postural limitations  ACTIVITY TOLERANCE: Activity tolerance: WFL for tasks assessed on eval  FUNCTIONAL OUTCOME MEASURES: Physical performance test: #1 (hand writing):23.32* Quick Dash: 15.95% 05/15/22: Quick Dash: 27.27%  UPPER EXTREMITY ROM:  WFL bilaterally  UPPER EXTREMITY MMT:   Grossly 4/5 overall bilaterally  HAND FUNCTION: Grip strength: Right: 58 lbs; Left: 60 lbs 05/30/22: Grip strength: Right: 67 lbs; Left: 67 lbs  COORDINATION: 9 Hole Peg test: Right: 40.19 sec; Left: 31.06 sec (pt reports increased pain with repetitive movements)  EDEMA: mild inflammation in index and long finger MCP on R hand    TODAY'S TREATMENT:                                                       05/30/22 Heat: moist heat on R hand for 10 mins while reviewing goals and improvements in pain.  Pt reports utilizing modified strategies to carry fire wood and overall significant decrease of pain, except if/when bumping hand.   Grip strengthening: engaged in opposition and full grasp with digiflex (light resistance) with focus on repetition to assess pain with increased resistance.  Pt reports pain increase to 1 or less, where it may have been 2-3 before.   Hand Gripper: with RUE on level 35# with gold spring. Pt picked up 1 inch blocks with gripper with 0 drops and reports of pain increase to just under 1/10.  OT increased challenge to incorporate increased shoulder flexion with sustained grasp.  Pt demonstrating good motor control and sustained grasp with pain of "under one". Ultrasound: Engaged in  Ultrasound treatment applied to R hand at/around index and long finger at dorsal and palmar aspect for 8 min to address pain relief and promote blood flow for tissue healing; pt verbally denied contraindications or precautions. No adverse reaction observed or reported after application; skin intact Parameters: 3.3 MHz, 20% duty cycle, and intensity of 0.3 W/cm2     05/25/22 Heat: moist heat on R hand for 10 mins while reviewing goals and current POC.  Pt asking about joint stability to increase ease with dancing.  Educated on Recruitment consultant with ankles and completing heel raises with UE support for stability while focusing on quality of ankle movement.  Encouraged pt to reach out to PT if further lower body joint instability concerns. Theraputty: engaged in gross grasp, key pinch, duck mouth (MCP flexion), and pinch and pull with focus on tolerating increased pinch  and resistance through index and long finger MCP with (tan) theraputty.  OT providing education on carryover to functional tasks. Ultrasound: Engaged in Ultrasound treatment applied to R hand at/around index and long finger at dorsal and palmar aspect for 7 min to address pain relief and promote blood flow for tissue healing; pt verbally denied contraindications or precautions. No adverse reaction observed or reported after application; skin intact Parameters: 3.3 MHz, 20% duty cycle, and intensity of 0.3 W/cm2    05/22/22 Heat: moist heat on R hand for 10 mins while reviewing goals and current POC. Therapeutic activity: engaged in peg board pattern replication initially with large grip pegs in resistance board with focus on pinch and grip strength with repetitive movement.  OT then directed pt to stack pegs together and remove for additional pinch and pull against resistance.  Pt with no c/o pain. OT increased challenge with small peg board pattern replication. Initiated task with placing small pegs one at a time and progressing to in-hand  manipulation and translation from palm to finger tips to place into peg board to challenge coordination and pain management.  Pt with no c/o pain. Ultrasound: Engaged in Ultrasound treatment applied to R hand at/around index and long finger at dorsal and palmar aspect for 8 min to address pain relief and promote blood flow for tissue healing; pt verbally denied contraindications or precautions. No adverse reaction observed or reported after application; skin intact Parameters: 3.3 MHz, 20% duty cycle, and intensity of 0.3 W/cm2    PATIENT EDUCATION: Education details: modifications of hand positioning and sustained grasp during functional tasks and management for pain  Person educated: Patient Education method: Explanation and Handouts Education comprehension: verbalized understanding  HOME EXERCISE PROGRAM: Access Code: [email or web address] URL: [email or web address] Date: 05/25/2022 Prepared by: [facility]  Exercises - Seated Digit Tendon Gliding  - 1 x daily - 2 sets - 5-10 reps - Seated Finger MP Extension AROM with Blocking  - 1 x daily - 2 sets - 10 reps - Thumb Opposition  - 1 x daily - 2 sets - 10 reps - Thumb Strengthening Stabilization CMC  - 1 x daily - 2 sets - 10 reps - 5-10 sec hold - Finger walking  - 1 x daily - 2 sets - 10 reps - Full grip and finger grip  - 1 x daily - 3 x weekly - 1 sets - 10-15 reps - Seated Finger MP Extension PROM  - 1 x daily - 1 sets - 10 reps - 10 sec hold - Seated Wrist Radial and Ulnar Deviation AROM  - 1 x daily - 1 sets - 10 reps - Putty Squeezes  - 1 x daily - 1 sets - 10 reps - Key Pinch with Putty  - 1 x daily - 1 sets - 10 reps - "Duck mouth"  - 1 x daily - 1 sets - 10 reps - Finger Pinch and Pull with Putty  - 1 x daily - 1 sets - 10 reps   GOALS: Goals reviewed with patient? Yes  SHORT TERM GOALS: Target date: 05/12/22  Pt will be independent in gentle PROM/AROM exercises to decrease  inflammation/pain. Baseline: Goal status: MET - 05/11/22  2.  Pt will verbalize understanding of use of modalities to decrease inflammation/pain. Baseline:  Goal status: MET - 05/11/22  3.  Pt will verbalize understanding of task modifications and/or potential AE needs to increase ease, safety, and independence with IADLS. Baseline:  Goal status: MET - 05/11/22   LONG TERM GOALS: Target date: 06/02/22  Pt will demonstrate improved fine motor coordination for ADLs as evidenced by decreasing 9 hole peg test score for RUE by 3 secs Baseline: 9 Hole Peg test: Right: 40.19 sec; Left: 31.06 sec (pt reports increased pain with repetitive movements) Goal status: IN PROGRESS  2.  Pt will report decreased pain in R hand/index finger during activities requiring grip to less than 5 on pain scale. Baseline:  Goal status: MET - 05/30/22  3.  Pt will report improved functional use of RUE as evidenced by improved score on Quick DASH to 10% impairment or less. Baseline: 15.9% Goal status: IN PROGRESS   ASSESSMENT:  CLINICAL IMPRESSION: Pt continuing to report decreased onset of pain and tolerating increased functional grasp and pinch.  Pt reporting carryover of modifications of handling of fire wood and during other functional tasks with decreased pain in R hand.  Pt reporting pleased with relief of pain with combination of therapy and medication.  Pt demonstrating increased grip strength with dynamometer and improved tolerance during hand gripper activity.    PERFORMANCE DEFICITS: in functional skills including ADLs, IADLs, coordination, edema, pain, Fine motor control, decreased knowledge of precautions, decreased knowledge of use of DME, and UE functional use and psychosocial skills including environmental adaptation and habits.   IMPAIRMENTS: are limiting patient from ADLs and IADLs.   CO-MORBIDITIES: may have co-morbidities  that affects occupational performance. Patient will benefit from skilled OT  to address above impairments and improve overall function.  MODIFICATION OR ASSISTANCE TO COMPLETE EVALUATION: No modification of tasks or assist necessary to complete an evaluation.  OT OCCUPATIONAL PROFILE AND HISTORY: Problem focused assessment: Including review of records relating to presenting problem.  CLINICAL DECISION MAKING: LOW - limited treatment options, no task modification necessary  REHAB POTENTIAL: Good  EVALUATION COMPLEXITY: Low    PLAN:  OT FREQUENCY: 1-2x/week  OT DURATION: 6 weeks  PLANNED INTERVENTIONS: self care/ADL training, therapeutic exercise, therapeutic activity, neuromuscular re-education, manual therapy, passive range of motion, splinting, ultrasound, paraffin, compression bandaging, moist heat, cryotherapy, patient/family education, and DME and/or AE instructions  RECOMMENDED OTHER SERVICES: NA  CONSULTED AND AGREED WITH PLAN OF CARE: Patient  PLAN FOR NEXT SESSION: Review HEP, add to as appropriate, engage in ultrasound, review modifications and/or AE to decrease strain on joints. D/C after next session.   Simonne Come, OTR/L 05/30/2022, 9:46 AM

## 2022-06-01 ENCOUNTER — Ambulatory Visit: Payer: No Typology Code available for payment source | Admitting: Occupational Therapy

## 2022-06-01 DIAGNOSIS — M6281 Muscle weakness (generalized): Secondary | ICD-10-CM | POA: Diagnosis not present

## 2022-06-01 DIAGNOSIS — M25541 Pain in joints of right hand: Secondary | ICD-10-CM

## 2022-06-01 NOTE — Therapy (Signed)
OUTPATIENT OCCUPATIONAL THERAPY  Treatment Note & Discharge  Patient Name: Elijah Knapp MRN: [id number] DOB:[birth year], 67 y.o., male Today's Date: 06/01/2022  PCP: Rosezena Sensor, DO REFERRING PROVIDER: Rosezena Sensor, DO  OCCUPATIONAL THERAPY DISCHARGE SUMMARY  Visits from Start of Care: 12  Current functional level related to goals / functional outcomes: Pt demonstrating improved functional use and verbalizes utilizing adaptive techniques and modifications to positioning and strategies to decrease repetitive movements/grasping to decrease onset of pain.     Remaining deficits: Flares of pain with repetitive movement, but pain does not last as long   Education / Equipment: Educated on adaptive strategies and modifying techniques to decrease pain with repetitive grasping movements, HEP for ROM and strengthening, education on modalities for pain   Patient agrees to discharge. Patient goals were met. Patient is being discharged due to meeting the stated rehab goals..     END OF SESSION:  OT End of Session - 06/01/22 0933     Visit Number 12    Number of Visits 13    Date for OT Re-Evaluation 06/02/22    Authorization Type [facility]    Authorization - Number of Visits 15    OT Start Time [id number]    OT Stop Time 1013    OT Time Calculation (min) 40 min                        Past Medical History:  Diagnosis Date   Arthritis    Asthma    Cancer ([place])    basal cell skin ca   Chronic lung disease    Complication of anesthesia    states he "stopped breathing" during knee surgery.    COPD (chronic obstructive pulmonary disease) (HCC)    Coronary artery disease    GERD (gastroesophageal reflux disease)    Hypertension    Leg cramps    PTSD (post-traumatic stress disorder)    Sleep apnea    uses cpap   Past Surgical History:  Procedure Laterality Date   CARDIAC CATHETERIZATION Left 05/28/2017   CORONARY ARTERY BYPASS  GRAFT N/A 06/04/2017   Procedure: CORONARY ARTERY BYPASS GRAFTING (CABG) x 3 WITH ENDOSCOPIC HARVESTING OF RIGHT SAPHENOUS VEIN;  Surgeon: Ivin Poot, MD;  Location: [place];  Service: Open Heart Surgery;  Laterality: N/A;   KNEE ARTHROSCOPY W/ MENISCAL REPAIR Right    KNEE SURGERY Left    LEFT HEART CATH AND CORONARY ANGIOGRAPHY N/A 05/28/2017   Procedure: LEFT HEART CATH AND CORONARY ANGIOGRAPHY;  Surgeon: Lorretta Harp, MD;  Location: [place];  Service: Cardiovascular;  Laterality: N/A;   NASAL SEPTOPLASTY W/ TURBINOPLASTY Bilateral 10/17/2019   Procedure: NASAL SEPTOPLASTY WITH TURBINATE REDUCTION;  Surgeon: [name], MD;  Location: [place];  Service: ENT;  Laterality: Bilateral;  NASAL SEPTOPLASTY WITH TURBINATE REDUCTION   STERNAL WIRES REMOVAL N/A 11/20/2019   Procedure: STERNAL WIRES REMOVAL;  Surgeon: Ivin Poot, MD;  Location: [place];  Service: Thoracic;  Laterality: N/A;   TEE WITHOUT CARDIOVERSION N/A 06/04/2017   Procedure: TRANSESOPHAGEAL ECHOCARDIOGRAM (TEE);  Surgeon: Prescott Gum, Collier Salina, MD;  Location: [place];  Service: Open Heart Surgery;  Laterality: N/A;   Patient Active Problem List   Diagnosis Date Noted   Visit for suture removal 12/03/2019   Postop check 11/12/2019   Deviated septum 10/17/2019   Protruding sternal wires 10/15/2019   Preoperative evaluation to rule out surgical contraindication 10/08/2019  Hx of CABG 06/26/2017   Dyslipidemia, goal LDL below 70 06/26/2017   Anxiety 06/26/2017   Essential hypertension 06/26/2017   Coronary artery disease 06/04/2017   Chest pain 05/23/2017    ONSET DATE: 04/06/22  REFERRING DIAG: [id number] (ICD-10-CM) - Pain in unspecified hand  THERAPY DIAG:  Muscle weakness (generalized)  Pain in joint of right hand  Rationale for Evaluation and Treatment: Rehabilitation  SUBJECTIVE:   SUBJECTIVE STATEMENT: Pt reports over the last few days, that his hand has been throbbing.  Pt reports that he has been  freezing collards and pressure on knife has been increasing pain.   Pt accompanied by: self  PERTINENT HISTORY: HTN, CAD, COPD, asthma, GERD   PRECAUTIONS: None  WEIGHT BEARING RESTRICTIONS: No  PAIN:  Are you having pain? Yes: NPRS scale: <1/10 Pain location: Index MCP  Pain description: constant Aggravating factors: movement, squeezing Relieving factors: medication  FALLS: Has patient fallen in last 6 months? No  LIVING ENVIRONMENT: Lives with: lives alone Lives in: House/apartment Stairs: Yes: Internal: steps down to the basement steps; can reach both and External: 3-4 steps; none Has following equipment at home: Single point cane  PLOF: Independent  PATIENT GOALS: to have less pain in R hand  OBJECTIVE:   HAND DOMINANCE: Right  ADLs: Overall ADLs: Mod I - Independent with all ADLs.  Occasional onset of pain in R hand with managing clothing fasteners and tying shoes  IADLs: Light housekeeping: Mod I- Independent Meal Prep: Mod I - Independent Community mobility: currently driving Medication management: Independent Financial management: Independent Handwriting: 100% legible and Increased time  MOBILITY STATUS: Independent  POSTURE COMMENTS:  No Significant postural limitations  ACTIVITY TOLERANCE: Activity tolerance: WFL for tasks assessed on eval  FUNCTIONAL OUTCOME MEASURES: Physical performance test: #1 (hand writing):23.32* Quick Dash: 15.95% 05/15/22: Quick Dash: 27.27% 06/01/22: Quick Dash: 9.09%  UPPER EXTREMITY ROM:  WFL bilaterally  UPPER EXTREMITY MMT:   Grossly 4/5 overall bilaterally  HAND FUNCTION: Grip strength: Right: 58 lbs; Left: 60 lbs 05/30/22: Grip strength: Right: 67 lbs; Left: 67 lbs  COORDINATION: 9 Hole Peg test: Right: 40.19 sec; Left: 31.06 sec (pt reports increased pain with repetitive movements) 06/01/22: Left: 27.94 sec  EDEMA: mild inflammation in index and long finger MCP on R hand    TODAY'S TREATMENT:                                                        06/01/22 Heat: moist heat on R hand for 10 mins while reviewing goals and improvements in pain.  Pt reports utilizing modified strategies to carry fire wood and overall significant decrease of pain, except if/when bumping hand.   Pain management: reviewed modified positioning and techniques, wearing gloves when outside, use of heat prior to strenuous tasks or exercises, and incorporating rest breaks during tasks to decrease repetition and onset of pain. 9 hole peg: 27.94 sec with no c/o pain Quick dash: 9.09.  Pt reports still having mild difficulty with use of knife to cut food as mild pain and tingling in hand - attributing to weather as well as tasks with use of knife when de-stemming in preparation for freezing collards.   Ultrasound: Engaged in Ultrasound treatment applied to R hand at/around index and long finger at dorsal and palmar aspect for 8 min  to address pain relief and promote blood flow for tissue healing; pt verbally denied contraindications or precautions. No adverse reaction observed or reported after application; skin intact Parameters: 3.3 MHz, 20% duty cycle, and intensity of 0.3 W/cm2     05/30/22 Heat: moist heat on R hand for 10 mins while reviewing goals and improvements in pain.  Pt reports utilizing modified strategies to carry fire wood and overall significant decrease of pain, except if/when bumping hand.   Grip strengthening: engaged in opposition and full grasp with digiflex (light resistance) with focus on repetition to assess pain with increased resistance.  Pt reports pain increase to 1 or less, where it may have been 2-3 before.   Hand Gripper: with RUE on level 35# with gold spring. Pt picked up 1 inch blocks with gripper with 0 drops and reports of pain increase to just under 1/10.  OT increased challenge to incorporate increased shoulder flexion with sustained grasp.  Pt demonstrating good motor control and sustained grasp  with pain of "under one". Ultrasound: Engaged in Ultrasound treatment applied to R hand at/around index and long finger at dorsal and palmar aspect for 8 min to address pain relief and promote blood flow for tissue healing; pt verbally denied contraindications or precautions. No adverse reaction observed or reported after application; skin intact Parameters: 3.3 MHz, 20% duty cycle, and intensity of 0.3 W/cm2     05/25/22 Heat: moist heat on R hand for 10 mins while reviewing goals and current POC.  Pt asking about joint stability to increase ease with dancing.  Educated on Recruitment consultant with ankles and completing heel raises with UE support for stability while focusing on quality of ankle movement.  Encouraged pt to reach out to PT if further lower body joint instability concerns. Theraputty: engaged in gross grasp, key pinch, duck mouth (MCP flexion), and pinch and pull with focus on tolerating increased pinch and resistance through index and long finger MCP with (tan) theraputty.  OT providing education on carryover to functional tasks. Ultrasound: Engaged in Ultrasound treatment applied to R hand at/around index and long finger at dorsal and palmar aspect for 7 min to address pain relief and promote blood flow for tissue healing; pt verbally denied contraindications or precautions. No adverse reaction observed or reported after application; skin intact Parameters: 3.3 MHz, 20% duty cycle, and intensity of 0.3 W/cm2    PATIENT EDUCATION: Education details: modifications of hand positioning and sustained grasp during functional tasks and management for pain  Person educated: Patient Education method: Explanation and Handouts Education comprehension: verbalized understanding  HOME EXERCISE PROGRAM: Access Code: [email or web address] URL: [email or web address] Date: 05/25/2022 Prepared by: [facility]  Exercises - Seated Digit Tendon Gliding  - 1 x  daily - 2 sets - 5-10 reps - Seated Finger MP Extension AROM with Blocking  - 1 x daily - 2 sets - 10 reps - Thumb Opposition  - 1 x daily - 2 sets - 10 reps - Thumb Strengthening Stabilization CMC  - 1 x daily - 2 sets - 10 reps - 5-10 sec hold - Finger walking  - 1 x daily - 2 sets - 10 reps - Full grip and finger grip  - 1 x daily - 3 x weekly - 1 sets - 10-15 reps - Seated Finger MP Extension PROM  - 1 x daily - 1 sets - 10 reps - 10 sec hold - Seated Wrist Radial and Ulnar Deviation AROM  -  1 x daily - 1 sets - 10 reps - Putty Squeezes  - 1 x daily - 1 sets - 10 reps - Key Pinch with Putty  - 1 x daily - 1 sets - 10 reps - "Duck mouth"  - 1 x daily - 1 sets - 10 reps - Finger Pinch and Pull with Putty  - 1 x daily - 1 sets - 10 reps   GOALS: Goals reviewed with patient? Yes  SHORT TERM GOALS: Target date: 05/12/22  Pt will be independent in gentle PROM/AROM exercises to decrease inflammation/pain. Baseline: Goal status: MET - 05/11/22  2.  Pt will verbalize understanding of use of modalities to decrease inflammation/pain. Baseline:  Goal status: MET - 05/11/22  3.  Pt will verbalize understanding of task modifications and/or potential AE needs to increase ease, safety, and independence with IADLS. Baseline:  Goal status: MET - 05/11/22   LONG TERM GOALS: Target date: 06/02/22  Pt will demonstrate improved fine motor coordination for ADLs as evidenced by decreasing 9 hole peg test score for RUE by 3 secs Baseline: 9 Hole Peg test: Right: 40.19 sec; Left: 31.06 sec (pt reports increased pain with repetitive movements) Goal status: MET - 27.94 sec on 06/01/22  2.  Pt will report decreased pain in R hand/index finger during activities requiring grip to less than 5 on pain scale. Baseline:  Goal status: MET - 05/30/22  3.  Pt will report improved functional use of RUE as evidenced by improved score on Quick DASH to 10% impairment or less. Baseline: 15.9% Goal status: MET - 9.09% on  06/01/22   ASSESSMENT:  CLINICAL IMPRESSION: Pt continuing to report overall decreased onset of pain and tolerating increased functional grasp and pinch.  Pt does report increased pain over the last few days due to the weather and repetitive use with de-stemming in preparation for freezing collards.  Pt reporting carryover of modifications of handling of fire wood and during other functional tasks with decreased pain in R hand.  Pt reporting pleased with relief of pain with combination of therapy and medication.  Pt reports improvements in pain and functional use of on Quick Dash. Pt in agreement with d/c at this time.  PERFORMANCE DEFICITS: in functional skills including ADLs, IADLs, coordination, edema, pain, Fine motor control, decreased knowledge of precautions, decreased knowledge of use of DME, and UE functional use and psychosocial skills including environmental adaptation and habits.   IMPAIRMENTS: are limiting patient from ADLs and IADLs.   CO-MORBIDITIES: may have co-morbidities  that affects occupational performance. Patient will benefit from skilled OT to address above impairments and improve overall function.  MODIFICATION OR ASSISTANCE TO COMPLETE EVALUATION: No modification of tasks or assist necessary to complete an evaluation.  OT OCCUPATIONAL PROFILE AND HISTORY: Problem focused assessment: Including review of records relating to presenting problem.  CLINICAL DECISION MAKING: LOW - limited treatment options, no task modification necessary  REHAB POTENTIAL: Good  EVALUATION COMPLEXITY: Low    PLAN:  OT FREQUENCY: 1-2x/week  OT DURATION: 6 weeks  PLANNED INTERVENTIONS: self care/ADL training, therapeutic exercise, therapeutic activity, neuromuscular re-education, manual therapy, passive range of motion, splinting, ultrasound, paraffin, compression bandaging, moist heat, cryotherapy, patient/family education, and DME and/or AE instructions  RECOMMENDED OTHER SERVICES:  NA  CONSULTED AND AGREED WITH PLAN OF CARE: Patient  PLAN FOR NEXT SESSION: D/C   Teri Diltz, OTR/L 06/01/2022, 9:34 AM

## 2022-06-16 ENCOUNTER — Ambulatory Visit
Admission: EM | Admit: 2022-06-16 | Discharge: 2022-06-16 | Disposition: A | Discharge status: Home / Self Care | Payer: Non-veteran care | Attending: Urgent Care | Admitting: Urgent Care

## 2022-06-16 DIAGNOSIS — H5711 Ocular pain, right eye: Secondary | ICD-10-CM | POA: Diagnosis not present

## 2022-06-16 DIAGNOSIS — H578A1 Foreign body sensation, right eye: Secondary | ICD-10-CM | POA: Diagnosis not present

## 2022-06-16 IMAGING — DX DG CHEST 2V
2 series · 2 of 2 positions shown · non-contrast
Comparison: July 11, 2017

CLINICAL DATA: Status post CABG.

EXAM:
CHEST - 2 VIEW

[dg chest 2 view (1 of 2)]
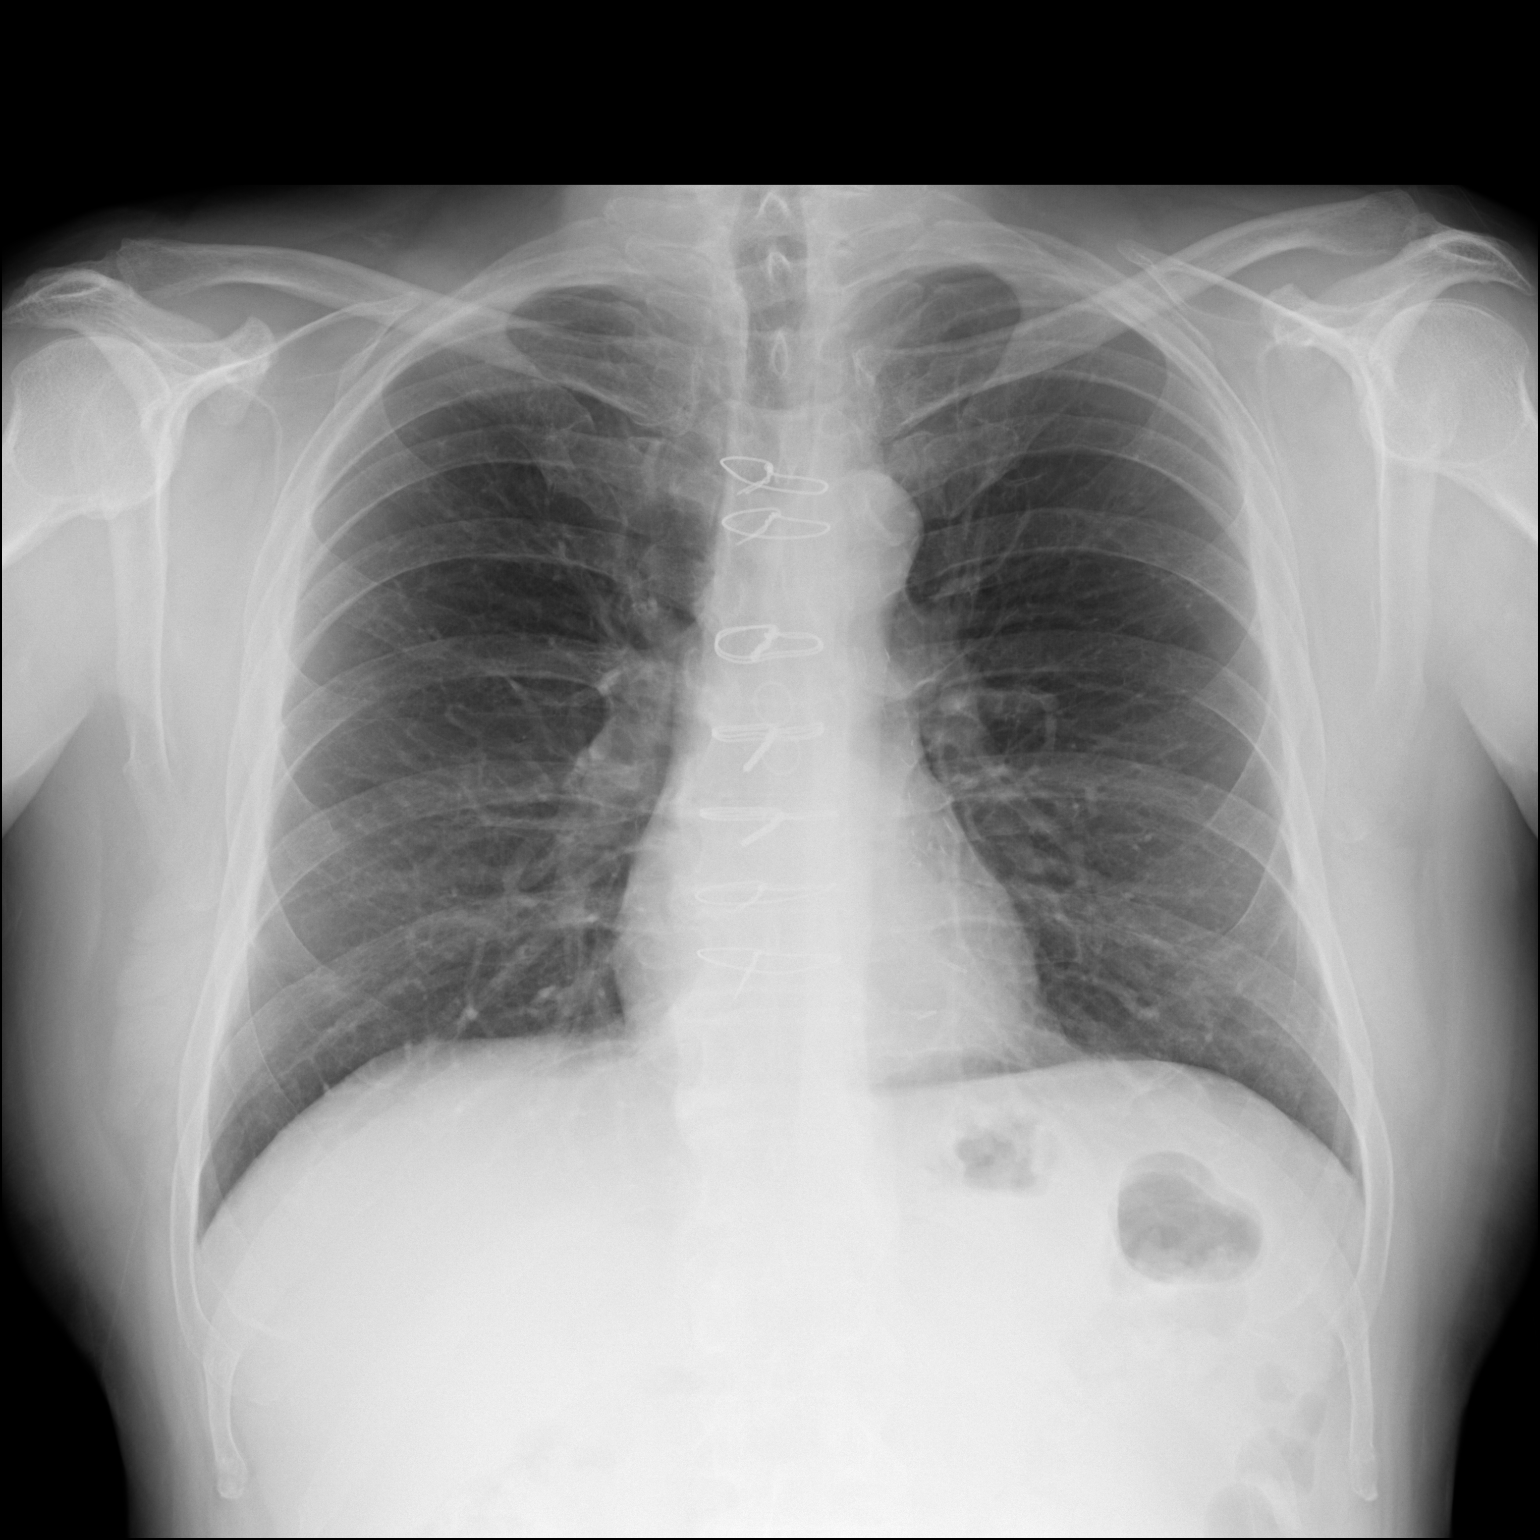

[dg chest 2 view (2 of 2)]
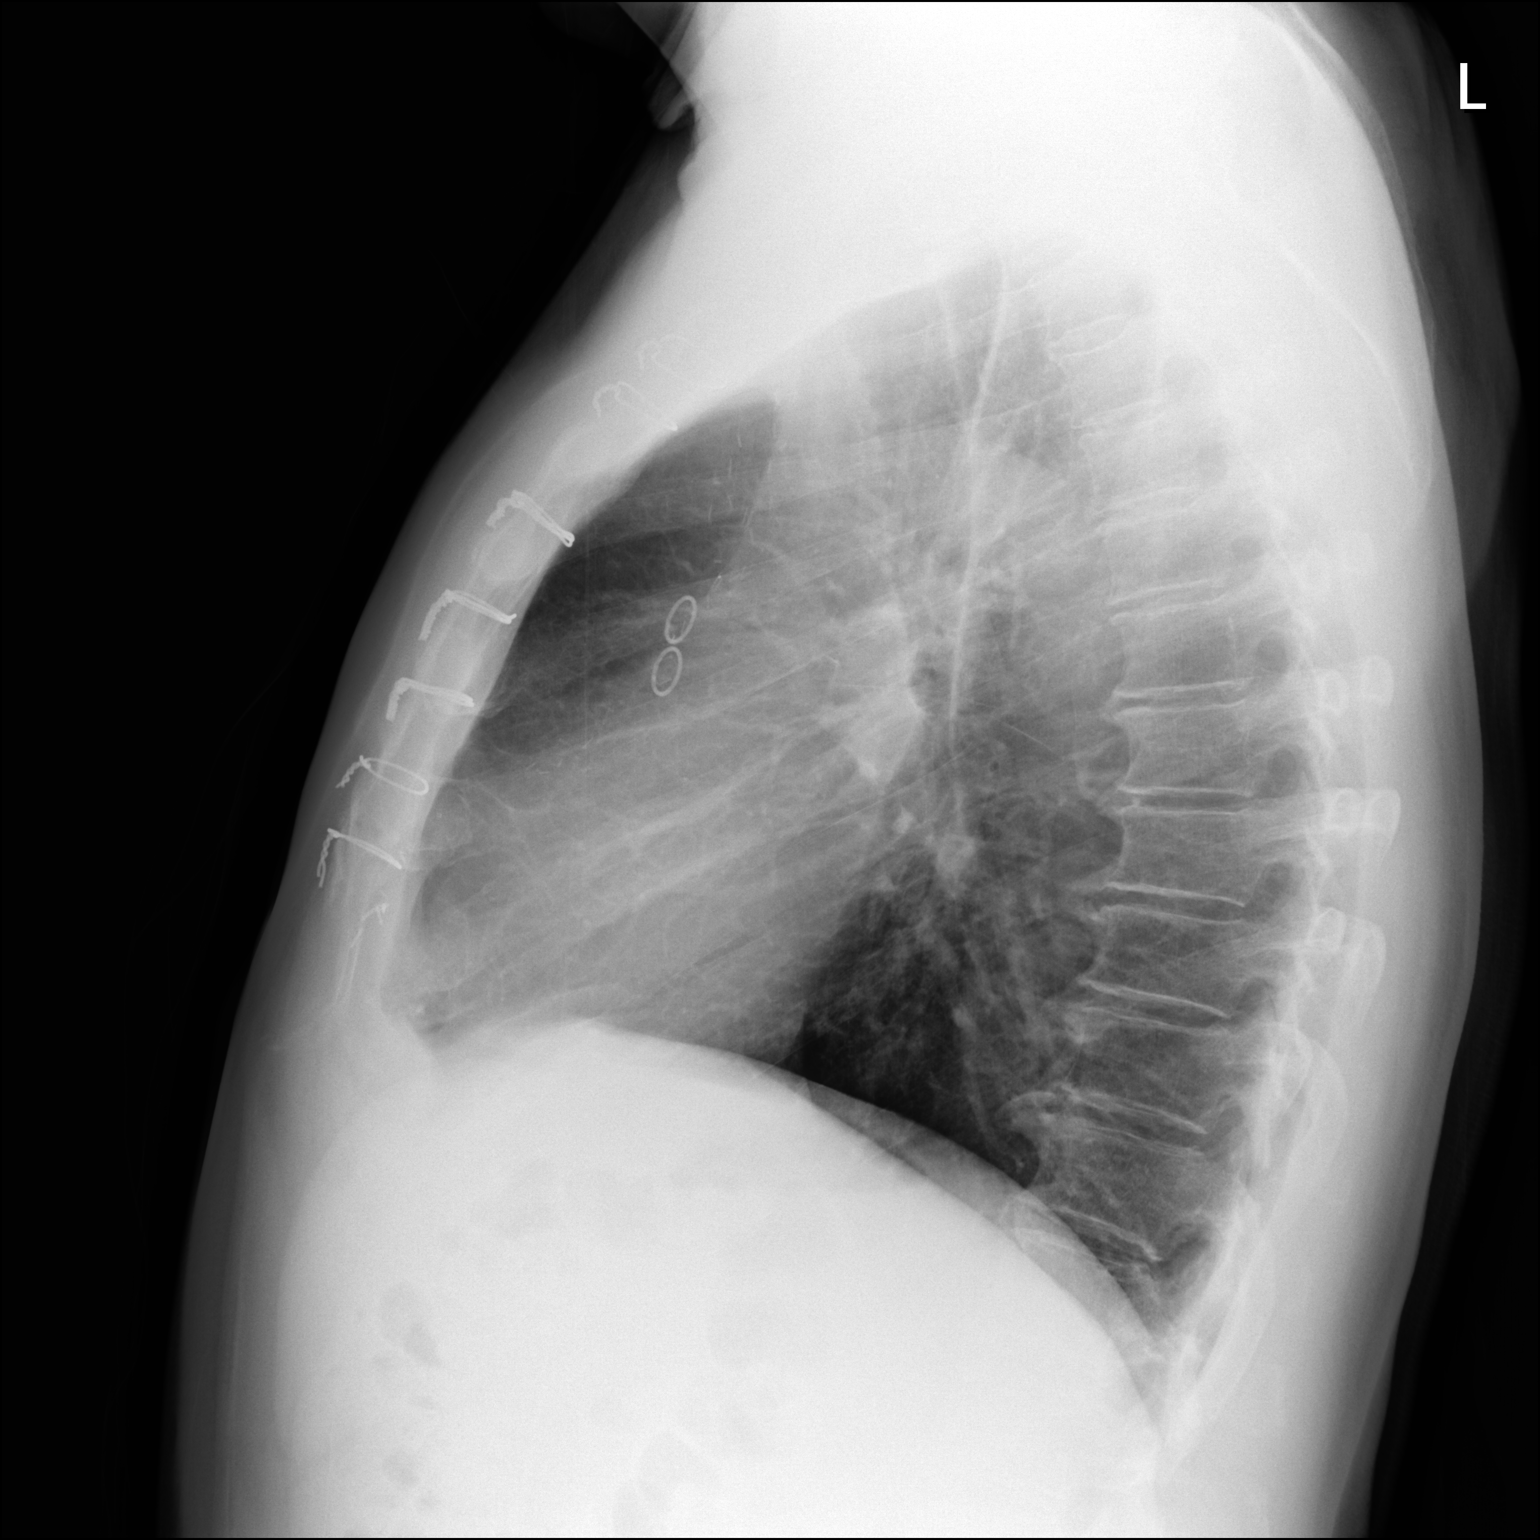

[2 of 2 positions shown; findings below may reference images not displayed]

FINDINGS: The patient is status post prior CABG. The heart size is stable.
Aortic calcifications are noted. There is no pneumothorax. No large
pleural effusion. No focal infiltrate. No acute displaced fracture.
IMPRESSION: No active cardiopulmonary disease.

## 2022-06-16 MED ORDER — TOBRAMYCIN 0.3 % OP SOLN: 1.0000 [drp] | OPHTHALMIC | 0 refills | Status: AC

## 2022-06-16 NOTE — ED Provider Notes (Signed)
[facility]  Note:  This document was prepared using Conservation officer, historic buildings and may include unintentional dictation errors.  MRN: [id number] DOB: [birth year]  Subjective:   Elijah Knapp is a 67 y.o. male presenting for 1 day history of persistent foreign body sensation of the right eye, right eye irritation and pain.  Feels that something flew into his eye when he was mowing the yard today.  No vision changes, bleeding.  No current facility-administered medications for this encounter.  Current Outpatient Medications:    ALPRAZolam (XANAX) 0.5 MG tablet, Take 1 tablet (0.5 mg total) by mouth at bedtime as needed for anxiety. (Patient not taking: Reported on 11/12/2019), Disp: 10 tablet, Rfl: 0   aspirin EC 81 MG tablet, Take 81 mg by mouth daily. Swallow whole., Disp: , Rfl:    atorvastatin (LIPITOR) 20 MG tablet, Take 20 mg by mouth at bedtime. , Disp: , Rfl:    budesonide-formoterol (SYMBICORT) 80-4.5 MCG/ACT inhaler, Inhale 2 puffs into the lungs 2 (two) times daily as needed (respiratory issues.)., Disp: , Rfl:    cefUROXime (CEFTIN) 250 MG tablet, Take 250 mg by mouth daily as needed (allergies). , Disp: , Rfl:    cetirizine (ZYRTEC) 10 MG tablet, Take 10 mg by mouth at bedtime as needed for allergies., Disp: , Rfl:    cholecalciferol (VITAMIN D3) 25 MCG (1000 UNIT) tablet, Take 1,000 Units by mouth daily. , Disp: , Rfl:    finasteride (PROSCAR) 5 MG tablet, Take 2.5 mg by mouth daily. , Disp: , Rfl:    fluocinonide ointment (LIDEX) 0.05 %, Apply 1 application topically 2 (two) times daily as needed (skin irritations (hands)). , Disp: , Rfl:    fluticasone (FLONASE) 50 MCG/ACT nasal spray, Place 1-2 sprays into both nostrils daily as needed (runny nose/allergies.). , Disp: , Rfl:    hydrocortisone 2.5 % ointment, Apply 1 application topically 2 (two) times daily as needed (skin irritations (hands)). , Disp: , Rfl:    losartan (COZAAR) 100 MG tablet,  Take 50 mg by mouth daily. , Disp: , Rfl:    metoprolol tartrate (LOPRESSOR) 50 MG tablet, Take 25 mg by mouth 2 (two) times daily. , Disp: , Rfl:    omeprazole (PRILOSEC) 20 MG capsule, Take 20 mg by mouth daily before breakfast. , Disp: , Rfl:    sildenafil (VIAGRA) 100 MG tablet, Take 100 mg by mouth daily as needed for erectile dysfunction. , Disp: , Rfl:    sodium chloride (OCEAN) 0.65 % SOLN nasal spray, Place 1 spray into both nostrils 4 (four) times daily as needed for congestion., Disp: , Rfl:    No Known Allergies  Past Medical History:  Diagnosis Date   Arthritis    Asthma    Cancer    basal cell skin ca   Chronic lung disease    Complication of anesthesia    states he "stopped breathing" during knee surgery.    COPD (chronic obstructive pulmonary disease)    Coronary artery disease    GERD (gastroesophageal reflux disease)    Hypertension    Leg cramps    PTSD (post-traumatic stress disorder)    Sleep apnea    uses cpap     Past Surgical History:  Procedure Laterality Date   CARDIAC CATHETERIZATION Left 05/28/2017   CORONARY ARTERY BYPASS GRAFT N/A 06/04/2017   Procedure: CORONARY ARTERY BYPASS GRAFTING (CABG) x 3 WITH ENDOSCOPIC HARVESTING OF RIGHT SAPHENOUS VEIN;  Surgeon: Kerin Perna,  MD;  Location: [facility];  Service: Open Heart Surgery;  Laterality: N/A;   KNEE ARTHROSCOPY W/ MENISCAL REPAIR Right    KNEE SURGERY Left    LEFT HEART CATH AND CORONARY ANGIOGRAPHY N/A 05/28/2017   Procedure: LEFT HEART CATH AND CORONARY ANGIOGRAPHY;  Surgeon: Runell Gess, MD;  Location: [facility];  Service: Cardiovascular;  Laterality: N/A;   NASAL SEPTOPLASTY W/ TURBINOPLASTY Bilateral 10/17/2019   Procedure: NASAL SEPTOPLASTY WITH TURBINATE REDUCTION;  Surgeon: Osborn Coho, MD;  Location: [facility] OR;  Service: ENT;  Laterality: Bilateral;  NASAL SEPTOPLASTY WITH TURBINATE REDUCTION   STERNAL WIRES REMOVAL N/A 11/20/2019   Procedure: STERNAL WIRES REMOVAL;  Surgeon:  Kerin Perna, MD;  Location: [facility] OR;  Service: Thoracic;  Laterality: N/A;   TEE WITHOUT CARDIOVERSION N/A 06/04/2017   Procedure: TRANSESOPHAGEAL ECHOCARDIOGRAM (TEE);  Surgeon: Donata Clay, Theron Arista, MD;  Location: [facility] OR;  Service: Open Heart Surgery;  Laterality: N/A;    Family History  Problem Relation Age of Onset   Alzheimer's disease Mother    Heart disease Father     Social History   Tobacco Use   Smoking status: Never   Smokeless tobacco: Never  Vaping Use   Vaping Use: Never used  Substance Use Topics   Alcohol use: Yes    Alcohol/week: 21.0 standard drinks of alcohol    Types: 7 Glasses of wine, 14 Cans of beer per week   Drug use: Never    ROS   Objective:   Vitals: BP 133/80 (BP Location: Left Arm)   Pulse 72   Temp 97.9 F (36.6 C) (Oral)   Resp 20   SpO2 96%   Physical Exam Constitutional:      General: He is not in acute distress.    Appearance: Normal appearance. He is well-developed and normal weight. He is not ill-appearing, toxic-appearing or diaphoretic.  HENT:     Head: Normocephalic and atraumatic.     Right Ear: External ear normal.     Left Ear: External ear normal.     Nose: Nose normal.     Mouth/Throat:     Pharynx: Oropharynx is clear.  Eyes:     General: Lids are everted, no foreign bodies appreciated. No scleral icterus.       Right eye: No foreign body, discharge or hordeolum.        Left eye: No foreign body, discharge or hordeolum.     Extraocular Movements: Extraocular movements intact.     Conjunctiva/sclera:     Right eye: Right conjunctiva is injected. No chemosis, exudate or hemorrhage.    Left eye: Left conjunctiva is not injected. No chemosis, exudate or hemorrhage.    Pupils: Pupils are equal, round, and reactive to light.  Cardiovascular:     Rate and Rhythm: Normal rate.  Pulmonary:     Effort: Pulmonary effort is normal.  Musculoskeletal:     Cervical back: Normal range of motion.  Neurological:     Mental  Status: He is alert and oriented to person, place, and time.  Psychiatric:        Mood and Affect: Mood normal.        Behavior: Behavior normal.        Thought Content: Thought content normal.        Judgment: Judgment normal.    Eye Exam: Eyelids everted and swept for foreign body. The eye was anesthetized with 2 drops of tetracaine and stained with fluorescein. Examination under woods  lamp does not reveal a foreign body or area of increased stain uptake. The eye was then irrigated copiously with saline.  Assessment and Plan :   PDMP not reviewed this encounter.  1. Acute right eye pain   2. Foreign body sensation, right eye     Start tobramycin to cover for a secondary infectious process.  No obvious corneal abrasion, foreign body.  Recommended follow-up with an ophthalmologist, provided him with the information to Dr. Vanessa Barbara who is on-call. Counseled patient on potential for adverse effects with medications prescribed/recommended today, ER and return-to-clinic precautions discussed, patient verbalized understanding.    Wallis Bamberg, [place] 06/16/22 1943

## 2022-06-16 NOTE — Discharge Instructions (Signed)
Please start tobramycin eyedrops for the next week.  This will cover for secondary infection of the eye.  With the eye exam that I performed, I did not see any specific foreign body.  However, it would be great for you to follow-up with the eye specialist on-call with [facility].  You can follow-up with Dr. Vanessa Barbara next week.

## 2022-06-16 NOTE — ED Triage Notes (Signed)
Pt c/o FB right eye while mowing ~4pm-NAD-steady gait

## 2023-12-20 ENCOUNTER — Encounter: Payer: Self-pay | Admitting: Internal Medicine

## 2024-01-01 ENCOUNTER — Telehealth: Payer: Self-pay | Admitting: *Deleted

## 2024-01-01 NOTE — Telephone Encounter (Signed)
 Preparing PV  paperwork.  Reached out to patient after seeing he was just hospitalized for PE and placed on ELIQUIS. Advised patient to call back and schedule OV once he had an idea how long we would be on blood thinner as ELIQUIS would need to be paused or completed before proceeding with procedure. Patient voiced understanding.

## 2024-01-10 ENCOUNTER — Encounter

## 2024-01-24 ENCOUNTER — Encounter: Admitting: Internal Medicine
